# Patient Record
Sex: Male | Born: 1985 | Race: Black or African American | Hispanic: No | Marital: Married | Smoking: Former smoker
Health system: Southern US, Community
[De-identification: ages and names within clinical notes are randomized; demographics above are authoritative.]

## PROBLEM LIST (undated history)

## (undated) DIAGNOSIS — R945 Abnormal results of liver function studies: Secondary | ICD-10-CM

## (undated) DIAGNOSIS — I2510 Atherosclerotic heart disease of native coronary artery without angina pectoris: Secondary | ICD-10-CM

## (undated) DIAGNOSIS — I10 Essential (primary) hypertension: Secondary | ICD-10-CM

## (undated) DIAGNOSIS — M6282 Rhabdomyolysis: Secondary | ICD-10-CM

## (undated) DIAGNOSIS — R7989 Other specified abnormal findings of blood chemistry: Secondary | ICD-10-CM

## (undated) DIAGNOSIS — R030 Elevated blood-pressure reading, without diagnosis of hypertension: Secondary | ICD-10-CM

## (undated) DIAGNOSIS — Z9289 Personal history of other medical treatment: Secondary | ICD-10-CM

## (undated) DIAGNOSIS — R079 Chest pain, unspecified: Secondary | ICD-10-CM

## (undated) DIAGNOSIS — Z720 Tobacco use: Secondary | ICD-10-CM

## (undated) HISTORY — PX: OTHER SURGICAL HISTORY: SHX169

## (undated) HISTORY — PX: NECK SURGERY: SHX720

## (undated) HISTORY — DX: Atherosclerotic heart disease of native coronary artery without angina pectoris: I25.10

## (undated) HISTORY — DX: Elevated blood-pressure reading, without diagnosis of hypertension: R03.0

## (undated) HISTORY — DX: Personal history of other medical treatment: Z92.89

---

## 2004-04-24 ENCOUNTER — Emergency Department (HOSPITAL_COMMUNITY): Admission: EM | Admit: 2004-04-24 | Discharge: 2004-04-24 | Payer: Self-pay | Admitting: Emergency Medicine

## 2014-06-30 ENCOUNTER — Emergency Department
Admission: EM | Admit: 2014-06-30 | Discharge: 2014-06-30 | Disposition: A | Discharge status: Home / Self Care | Payer: PRIVATE HEALTH INSURANCE | Attending: Emergency Medicine | Admitting: Emergency Medicine

## 2014-06-30 ENCOUNTER — Encounter: Payer: Self-pay | Admitting: Emergency Medicine

## 2014-06-30 ENCOUNTER — Emergency Department: Payer: PRIVATE HEALTH INSURANCE

## 2014-06-30 DIAGNOSIS — Z720 Tobacco use: Secondary | ICD-10-CM | POA: Insufficient documentation

## 2014-06-30 DIAGNOSIS — M79672 Pain in left foot: Secondary | ICD-10-CM | POA: Insufficient documentation

## 2014-06-30 HISTORY — DX: Other specified abnormal findings of blood chemistry: R79.89

## 2014-06-30 HISTORY — DX: Abnormal results of liver function studies: R94.5

## 2014-06-30 LAB — COMPREHENSIVE METABOLIC PANEL
ALBUMIN: 4.1 g/dL (ref 3.5–5.0)
ALK PHOS: 53 U/L (ref 38–126)
ALT: 36 U/L (ref 17–63)
ANION GAP: 7 (ref 5–15)
AST: 43 U/L — AB (ref 15–41)
BUN: 15 mg/dL (ref 6–20)
CHLORIDE: 104 mmol/L (ref 101–111)
CO2: 28 mmol/L (ref 22–32)
CREATININE: 1.16 mg/dL (ref 0.61–1.24)
Calcium: 9 mg/dL (ref 8.9–10.3)
GFR calc Af Amer: >60 mL/min (ref >60)
GFR calc non Af Amer: >60 mL/min (ref >60)
Glucose, Bld: 104 mg/dL — ABNORMAL HIGH (ref 65–99)
POTASSIUM: 3.8 mmol/L (ref 3.5–5.1)
SODIUM: 139 mmol/L (ref 135–145)
TOTAL PROTEIN: 7.3 g/dL (ref 6.5–8.1)
Total Bilirubin: 0.5 mg/dL (ref 0.3–1.2)

## 2014-06-30 MED ORDER — IBUPROFEN 800 MG PO TABS: 800.0000 mg | ORAL_TABLET | Freq: Three times a day (TID) | ORAL | Status: DC | PRN

## 2014-06-30 NOTE — ED Notes (Signed)
Pt reports that he injured his right foot a few years back, and it is starting to act up. He is able to ambulate and bear weight without difficulty. He reports that while he is here he wants his liver enzymes checked.
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
## 2015-02-28 ENCOUNTER — Encounter: Payer: Self-pay | Admitting: Emergency Medicine

## 2015-02-28 ENCOUNTER — Emergency Department
Admission: EM | Admit: 2015-02-28 | Discharge: 2015-03-01 | Disposition: A | Discharge status: Home / Self Care | Payer: Self-pay | Attending: Emergency Medicine | Admitting: Emergency Medicine

## 2015-02-28 DIAGNOSIS — K920 Hematemesis: Secondary | ICD-10-CM

## 2015-02-28 DIAGNOSIS — R1032 Left lower quadrant pain: Secondary | ICD-10-CM

## 2015-02-28 DIAGNOSIS — F17200 Nicotine dependence, unspecified, uncomplicated: Secondary | ICD-10-CM | POA: Insufficient documentation

## 2015-02-28 DIAGNOSIS — K2970 Gastritis, unspecified, without bleeding: Secondary | ICD-10-CM | POA: Insufficient documentation

## 2015-02-28 DIAGNOSIS — N50819 Testicular pain, unspecified: Secondary | ICD-10-CM | POA: Insufficient documentation

## 2015-02-28 LAB — CBC
HCT: 44.2 % (ref 40.0–52.0)
Hemoglobin: 14.5 g/dL (ref 13.0–18.0)
MCH: 28.4 pg (ref 26.0–34.0)
MCHC: 32.9 g/dL (ref 32.0–36.0)
MCV: 86.2 fL (ref 80.0–100.0)
Platelets: 180 K/uL (ref 150–440)
RBC: 5.13 MIL/uL (ref 4.40–5.90)
RDW: 13.4 % (ref 11.5–14.5)
WBC: 6.3 K/uL (ref 3.8–10.6)

## 2015-02-28 LAB — COMPREHENSIVE METABOLIC PANEL
ALBUMIN: 4.7 g/dL (ref 3.5–5.0)
ALT: 31 U/L (ref 17–63)
ANION GAP: 6 (ref 5–15)
AST: 52 U/L — AB (ref 15–41)
Alkaline Phosphatase: 67 U/L (ref 38–126)
BUN: 11 mg/dL (ref 6–20)
CHLORIDE: 107 mmol/L (ref 101–111)
CO2: 30 mmol/L (ref 22–32)
Calcium: 9.1 mg/dL (ref 8.9–10.3)
Creatinine, Ser: 0.95 mg/dL (ref 0.61–1.24)
GFR calc Af Amer: >60 mL/min (ref >60)
GFR calc non Af Amer: >60 mL/min (ref >60)
GLUCOSE: 95 mg/dL (ref 65–99)
POTASSIUM: 3.9 mmol/L (ref 3.5–5.1)
SODIUM: 143 mmol/L (ref 135–145)
Total Bilirubin: 0.6 mg/dL (ref 0.3–1.2)
Total Protein: 7.8 g/dL (ref 6.5–8.1)

## 2015-02-28 LAB — LIPASE, BLOOD: Lipase: 21 U/L (ref 11–51)

## 2015-02-28 MED ORDER — FAMOTIDINE 20 MG PO TABS
40.0000 mg | ORAL_TABLET | Freq: Once | ORAL | Status: AC
  Administered 2015-03-01: 40 mg via ORAL
  Filled 2015-02-28: qty 2

## 2015-02-28 MED ORDER — ONDANSETRON 4 MG PO TBDP
4.0000 mg | ORAL_TABLET | Freq: Once | ORAL | Status: AC
  Administered 2015-03-01: 4 mg via ORAL
  Filled 2015-02-28: qty 1

## 2015-02-28 MED ORDER — GI COCKTAIL ~~LOC~~
30.0000 mL | Freq: Once | ORAL | Status: AC
  Administered 2015-03-01: 30 mL via ORAL
  Filled 2015-02-28: qty 30

## 2015-02-28 NOTE — ED Notes (Addendum)
Pt says he used to be a heavy drinker but quit about 6 months ago;  today he drank 9 beers; began feeling bad from the alcohol so he made himself vomit; after vomiting several times he noticed bright red blood; c/o abd pain; no history of similar symptoms

## 2015-02-28 NOTE — ED Notes (Signed)
Pt also complains of left sided groin pain. Pt states pain increases "when i work out." pt denies known hematuria, denies fever, difficulty urinating, foul odor noted to urine. Pt denies back pain. Pt moving all extremities without difficulty.

## 2015-02-28 NOTE — ED Provider Notes (Signed)
[facility] Emergency Department Provider Note  ____________________________________________  Time seen: Approximately 11:43 PM  I have reviewed the triage vital signs and the nursing notes.   HISTORY  Chief Complaint Hematemesis and Abdominal Pain    HPI Benjamin Beltran is a 30 y.o. male who comes into the hospital with upper abdominal pain and hematemesis. The patient reports that he vomited blood after drinking a little. He reports he is also had groin pain for 2 months and he is unsure if that has anything to do with the blood in his vomit. The patient reports that he drank a 12 pack of beer this weekend. He drinks 3 beers yesterday and 9 beers today. He reports that he hasn't drank in a few months so when he felt sick he decided to induce vomiting. He reports that when he first started vomiting he didn't see what look like as he was outside but then when he was finished and turned the light on he saw some blood in the snow. He reports it was a lot of blood and look like it was purely blood. The patient's also having some epigastric and left upper quadrant abdominal pain. He rates the pain a 5 out of 10 in intensity. He reports that along with his groin pain and makes it feel as if his whole left side hurts. The patient still feels nauseous but last vomited 1 hour ago. The patient reports that with the groin pain and seems to be worse after exercise or running. He reports that he hasn't ran in the last 2 days and it did seem a little bit better but it seems to come and go as well. The patient is unsure what going on so he decided to come in and get checked out.   Past Medical History  Diagnosis Date  . Elevated liver function tests unk    There are no active problems to display for this patient.   Past Surgical History  Procedure Laterality Date  . Tendon surgery to left hand  unk    Current Outpatient Rx  Name  Route  Sig  Dispense  Refill  . famotidine  (PEPCID) 40 MG tablet   Oral   Take 1 tablet (40 mg total) by mouth every evening.   20 tablet   0   . ibuprofen (ADVIL,MOTRIN) 800 MG tablet   Oral   Take 1 tablet (800 mg total) by mouth every 8 (eight) hours as needed.   30 tablet   0     Allergies Review of patient's allergies indicates no known allergies.  History reviewed. No pertinent family history.  Social History Social History  Substance Use Topics  . Smoking status: Current Every Day Smoker  . Smokeless tobacco: None  . Alcohol Use: Yes    Review of Systems Constitutional: No fever/chills Eyes: No visual changes. ENT: No sore throat. Cardiovascular: Denies chest pain. Respiratory: Denies shortness of breath. Gastrointestinal:  abdominal pain.  nausea,  Vomiting with hematemesis.  No diarrhea.  No constipation. Genitourinary: Groin and testicle pain Musculoskeletal: Negative for back pain. Skin: Negative for rash. Neurological: Negative for headaches, focal weakness or numbness.  10-point ROS otherwise negative.  ____________________________________________   PHYSICAL EXAM:  VITAL SIGNS: ED Triage Vitals  Enc Vitals Group     BP 02/28/15 2240 119/67 mmHg     Pulse Rate 02/28/15 2240 66     Resp 02/28/15 2240 18     Temp 02/28/15 2240 97.9 F (  36.6 C)     Temp Source 02/28/15 2240 Oral     SpO2 02/28/15 2240 100 %     Weight 02/28/15 2240 220 lb (99.791 kg)     Height 02/28/15 2240 5\' 11"  (1.803 m)     Head Cir --      Peak Flow --      Pain Score 02/28/15 2241 3     Pain Loc --      Pain Edu? --      Excl. in GC? --     Constitutional: Alert and oriented. Well appearing and in mild distress. Eyes: Conjunctivae are normal. PERRL. EOMI. Head: Atraumatic. Nose: No congestion/rhinnorhea. Mouth/Throat: Mucous membranes are moist.  Oropharynx non-erythematous. Cardiovascular: Normal rate, regular rhythm. Grossly normal heart sounds.  Good peripheral circulation. Respiratory: Normal  respiratory effort.  No retractions. Lungs CTAB. Gastrointestinal: Soft with epigastric and left upper quadrant tenderness to palpation. No distention. Positive bowel sounds Genitourinary: Mild testicular tenderness to palpation with no bulging at the inguinal canal or into scrotum Musculoskeletal: No lower extremity tenderness nor edema.   Neurologic:  Normal speech and language. No gross focal neurologic deficits are appreciated. No gait instability. Skin:  Skin is warm, dry and intact.  Psychiatric: Mood and affect are normal.   ____________________________________________   LABS (all labs ordered are listed, but only abnormal results are displayed)  Labs Reviewed  COMPREHENSIVE METABOLIC PANEL - Abnormal; Notable for the following:    AST 52 (*)    All other components within normal limits  URINALYSIS COMPLETEWITH MICROSCOPIC ([facility] ONLY) - Abnormal; Notable for the following:    Color, Urine YELLOW (*)    APPearance CLEAR (*)    Hgb urine dipstick 1+ (*)    Bacteria, UA RARE (*)    Squamous Epithelial / LPF 0-5 (*)    All other components within normal limits  LIPASE, BLOOD  CBC   ____________________________________________  EKG  none ____________________________________________  RADIOLOGY  Testicular ultrasound: No testicular torsion, right epididymal head cyst, right sided varicocele. ____________________________________________   PROCEDURES  Procedure(s) performed: None  Critical Care performed: No  ____________________________________________   INITIAL IMPRESSION / ASSESSMENT AND PLAN / ED COURSE  Pertinent labs & imaging results that were available during my care of the patient were reviewed by me and considered in my medical decision making (see chart for details).  This is a 30 year old male who comes into the hospital today with some left upper quadrant pain after vomiting copiously as well as some hematemesis. Feel that the patient may have some  gastritis which may have caused the vomiting and the blood. I will give the patient a dose of Zofran as well as a GI cocktail and Pepcid. I will also send the patient for an ultrasound given his testicle pain that worsens with activity. He may have some epididymitis causing his symptoms. I will reassess the patient once he received his medication and his ultrasound.  After the GI cocktail the patient's symptoms felt better. I went to the patient that he had gastritis likely from the a lot of alcohol which she drank. At this time the patient will be discharged home. I splinted him that he needs some support to his testicles to help with some of that discomfort he [facility]. The patient will be discharged home to follow-up. ____________________________________________   FINAL CLINICAL IMPRESSION(S) / ED DIAGNOSES  Final diagnoses:  Left groin pain  Gastritis  Hematemesis with nausea  Rebecka Apley, MD 03/01/15 [phone]

## 2015-03-01 ENCOUNTER — Emergency Department: Payer: PRIVATE HEALTH INSURANCE

## 2015-03-01 LAB — URINALYSIS COMPLETE WITH MICROSCOPIC (ARMC ONLY)
Bilirubin Urine: NEGATIVE
Glucose, UA: NEGATIVE mg/dL
KETONES UR: NEGATIVE mg/dL
Leukocytes, UA: NEGATIVE
NITRITE: NEGATIVE
PH: 5 (ref 5.0–8.0)
PROTEIN: NEGATIVE mg/dL
RBC / HPF: NONE SEEN RBC/hpf (ref 0–5)
SPECIFIC GRAVITY, URINE: 1.023 (ref 1.005–1.030)

## 2015-03-01 MED ORDER — FAMOTIDINE 40 MG PO TABS
40.0000 mg | ORAL_TABLET | Freq: Every evening | ORAL | Status: DC
Start: 2015-03-01 — End: 2015-12-27

## 2015-03-01 NOTE — Discharge Instructions (Signed)
Gastritis, Adult Gastritis is soreness and swelling (inflammation) of the lining of the stomach. Gastritis can develop as a sudden onset (acute) or long-term (chronic) condition. If gastritis is not treated, it can lead to stomach bleeding and ulcers. CAUSES  Gastritis occurs when the stomach lining is weak or damaged. Digestive juices from the stomach then inflame the weakened stomach lining. The stomach lining may be weak or damaged due to viral or bacterial infections. One common bacterial infection is the Helicobacter pylori infection. Gastritis can also result from excessive alcohol consumption, taking certain medicines, or having too much acid in the stomach.  SYMPTOMS  In some cases, there are no symptoms. When symptoms are present, they may include:  Pain or a burning sensation in the upper abdomen.  Nausea.  Vomiting.  An uncomfortable feeling of fullness after eating. DIAGNOSIS  Your caregiver may suspect you have gastritis based on your symptoms and a physical exam. To determine the cause of your gastritis, your caregiver may perform the following:  Blood or stool tests to check for the H pylori bacterium.  Gastroscopy. A thin, flexible tube (endoscope) is passed down the esophagus and into the stomach. The endoscope has a light and camera on the end. Your caregiver uses the endoscope to view the inside of the stomach.  Taking a tissue sample (biopsy) from the stomach to examine under a microscope. TREATMENT  Depending on the cause of your gastritis, medicines may be prescribed. If you have a bacterial infection, such as an H pylori infection, antibiotics may be given. If your gastritis is caused by too much acid in the stomach, H2 blockers or antacids may be given. Your caregiver may recommend that you stop taking aspirin, ibuprofen, or other nonsteroidal anti-inflammatory drugs (NSAIDs). HOME CARE INSTRUCTIONS  Only take over-the-counter or prescription medicines as directed by  your caregiver.  If you were given antibiotic medicines, take them as directed. Finish them even if you start to feel better.  Drink enough fluids to keep your urine clear or pale yellow.  Avoid foods and drinks that make your symptoms worse, such as:  Caffeine or alcoholic drinks.  Chocolate.  Peppermint or mint flavorings.  Garlic and onions.  Spicy foods.  Citrus fruits, such as oranges, lemons, or limes.  Tomato-based foods such as sauce, chili, salsa, and pizza.  Fried and fatty foods.  Eat small, frequent meals instead of large meals. SEEK IMMEDIATE MEDICAL CARE IF:   You have black or dark red stools.  You vomit blood or material that looks like coffee grounds.  You are unable to keep fluids down.  Your abdominal pain gets worse.  You have a fever.  You do not feel better after 1 week.  You have any other questions or concerns. MAKE SURE YOU:  Understand these instructions.  Will watch your condition.  Will get help right away if you are not doing well or get worse.   This information is not intended to replace advice given to you by your health care provider. Make sure you discuss any questions you have with your health care provider.   Document Released: 01/31/2001 Document Revised: 08/08/2011 Document Reviewed: 03/22/2011 Elsevier Interactive Patient Education 2016 Elsevier Inc.  Hematemesis Hematemesis is when you vomit blood. It is a sign of bleeding in the upper part of your digestive tract. This is also called your gastrointestinal (GI) tract. Your upper GI tract includes your mouth, throat, esophagus, stomach, and the first part of your small intestine (duodenum).  Hematemesis is usually caused by bleeding from your esophagus or stomach. You may suddenly vomit bright red blood. You might also vomit old blood. It may look like coffee grounds. You may also have other symptoms, such as:  Stomach pain.  Heartburn.  Black and tarry stool.   HOME CARE INSTRUCTIONS  Watch your hematemesis for any changes. The following actions may help to lessen any discomfort you are feeling:  Take medicines only as directed by your health care provider. Do not take aspirin, ibuprofen, or any other anti-inflammatory medicine without approval from your health care provider.  Rest as needed.  Drink small sips of clear liquids often, as long as you can keep them down. Try to drink enough fluids to keep your urine clear or pale yellow.  Do not drink alcohol.  Do not use any tobacco products, including cigarettes, chewing tobacco, or electronic cigarettes. If you need help quitting, ask your health care provider.  Keep all follow-up visits as directed by your health care provider. This is important. SEEK MEDICAL CARE IF:   The vomiting of blood worsens, or begins again after it has stopped.  You have persistent stomach pain.  You have nausea, indigestion, or heartburn.  You feel weak or dizzy. SEEK IMMEDIATE MEDICAL CARE IF:   You faint or feel extremely weak.  You have a rapid heartbeat.  You are urinating less than normal or not at all.  You have persistent vomiting.  You vomit large amounts of bloody or dark material.  You vomit bright red blood.  You pass large, dark, or bloody stools.  You have chest pain or trouble breathing.   This information is not intended to replace advice given to you by your health care provider. Make sure you discuss any questions you have with your health care provider.   Document Released: 03/16/2004 Document Revised: 02/27/2014 Document Reviewed: 10/01/2013 Elsevier Interactive Patient Education 2016 Elsevier Inc.  Nausea and Vomiting Nausea is a sick feeling that often comes before throwing up (vomiting). Vomiting is a reflex where stomach contents come out of your mouth. Vomiting can cause severe loss of body fluids (dehydration). Children and elderly adults can become dehydrated  quickly, especially if they also have diarrhea. Nausea and vomiting are symptoms of a condition or disease. It is important to find the cause of your symptoms. CAUSES   Direct irritation of the stomach lining. This irritation can result from increased acid production (gastroesophageal reflux disease), infection, food poisoning, taking certain medicines (such as nonsteroidal anti-inflammatory drugs), alcohol use, or tobacco use.  Signals from the brain.These signals could be caused by a headache, heat exposure, an inner ear disturbance, increased pressure in the brain from injury, infection, a tumor, or a concussion, pain, emotional stimulus, or metabolic problems.  An obstruction in the gastrointestinal tract (bowel obstruction).  Illnesses such as diabetes, hepatitis, gallbladder problems, appendicitis, kidney problems, cancer, sepsis, atypical symptoms of a heart attack, or eating disorders.  Medical treatments such as chemotherapy and radiation.  Receiving medicine that makes you sleep (general anesthetic) during surgery. DIAGNOSIS Your caregiver may ask for tests to be done if the problems do not improve after a few days. Tests may also be done if symptoms are severe or if the reason for the nausea and vomiting is not clear. Tests may include:  Urine tests.  Blood tests.  Stool tests.  Cultures (to look for evidence of infection).  X-rays or other imaging studies. Test results can help your caregiver make decisions  about treatment or the need for additional tests. TREATMENT You need to stay well hydrated. Drink frequently but in small amounts.You may wish to drink water, sports drinks, clear broth, or eat frozen ice pops or gelatin dessert to help stay hydrated.When you eat, eating slowly may help prevent nausea.There are also some antinausea medicines that may help prevent nausea. HOME CARE INSTRUCTIONS   Take all medicine as directed by your caregiver.  If you do not have  an appetite, do not force yourself to eat. However, you must continue to drink fluids.  If you have an appetite, eat a normal diet unless your caregiver tells you differently.  Eat a variety of complex carbohydrates (rice, wheat, potatoes, bread), lean meats, yogurt, fruits, and vegetables.  Avoid high-fat foods because they are more difficult to digest.  Drink enough water and fluids to keep your urine clear or pale yellow.  If you are dehydrated, ask your caregiver for specific rehydration instructions. Signs of dehydration may include:  Severe thirst.  Dry lips and mouth.  Dizziness.  Dark urine.  Decreasing urine frequency and amount.  Confusion.  Rapid breathing or pulse. SEEK IMMEDIATE MEDICAL CARE IF:   You have blood or brown flecks (like coffee grounds) in your vomit.  You have black or bloody stools.  You have a severe headache or stiff neck.  You are confused.  You have severe abdominal pain.  You have chest pain or trouble breathing.  You do not urinate at least once every 8 hours.  You develop cold or clammy skin.  You continue to vomit for longer than 24 to 48 hours.  You have a fever. MAKE SURE YOU:   Understand these instructions.  Will watch your condition.  Will get help right away if you are not doing well or get worse.   This information is not intended to replace advice given to you by your health care provider. Make sure you discuss any questions you have with your health care provider.   Document Released: 02/06/2005 Document Revised: 05/01/2011 Document Reviewed: 07/06/2010 Elsevier Interactive Patient Education [employer] Inc[employer]

## 2015-03-01 NOTE — ED Notes (Signed)
MD at bedside. 

## 2015-03-01 NOTE — ED Notes (Signed)
Pt states feels improved after administration of medication. Pt watching tv and texting on phone in no acute distress.

## 2015-03-01 NOTE — ED Notes (Signed)
Pt updated on ultrasound process. Pt verbalizes understanding.  

## 2015-03-01 NOTE — ED Notes (Signed)
Pt returned from ultrasound

## 2015-03-01 NOTE — ED Notes (Signed)
Patient transported to Ultrasound 

## 2015-03-01 NOTE — ED Notes (Signed)
Pt discharged by laurie allen, rn.  

## 2015-04-19 ENCOUNTER — Emergency Department
Admission: EM | Admit: 2015-04-19 | Discharge: 2015-04-20 | Disposition: A | Discharge status: Home / Self Care | Payer: PRIVATE HEALTH INSURANCE | Attending: Emergency Medicine | Admitting: Emergency Medicine

## 2015-04-19 DIAGNOSIS — F17200 Nicotine dependence, unspecified, uncomplicated: Secondary | ICD-10-CM | POA: Insufficient documentation

## 2015-04-19 DIAGNOSIS — K047 Periapical abscess without sinus: Secondary | ICD-10-CM

## 2015-04-19 DIAGNOSIS — Z79899 Other long term (current) drug therapy: Secondary | ICD-10-CM | POA: Insufficient documentation

## 2015-04-19 DIAGNOSIS — K029 Dental caries, unspecified: Secondary | ICD-10-CM

## 2015-04-19 MED ORDER — PENICILLIN V POTASSIUM 500 MG PO TABS
500.0000 mg | ORAL_TABLET | Freq: Once | ORAL | Status: AC
  Administered 2015-04-19: 500 mg via ORAL
  Filled 2015-04-19: qty 1

## 2015-04-19 MED ORDER — PENICILLIN V POTASSIUM 500 MG PO TABS: 500.0000 mg | ORAL_TABLET | Freq: Four times a day (QID) | ORAL | Status: DC

## 2015-04-19 MED ORDER — LIDOCAINE-EPINEPHRINE 2 %-1:100000 IJ SOLN
1.7000 mL | Freq: Once | INTRAMUSCULAR | Status: DC
  Filled 2015-04-19: qty 1.7

## 2015-04-19 NOTE — ED Notes (Signed)
Pt presents to ED with c/o LEFT upper and lower jaw pain x1 month. Obvious swelling noted, pt reports 8/10 pain. Pt denies difficulty swallowing or breathing; reports painful to chew.

## 2015-04-19 NOTE — ED Notes (Signed)
Pt arrived to ED with c/o tooth pain. Pt c/o pain in left upper and lower jaw. + facial swelling noted.

## 2015-04-19 NOTE — Discharge Instructions (Signed)
Dental Abscess A dental abscess is pus in or around a tooth. HOME CARE  Take medicines only as told by your dentist.  If you were prescribed antibiotic medicine, finish all of it even if you start to feel better.  Rinse your mouth (gargle) often with salt water.  Do not drive or use heavy machinery, like a lawn mower, while taking pain medicine.  Do not apply heat to the outside of your mouth.  Keep all follow-up visits as told by your dentist. This is important. GET HELP IF:  Your pain is worse, and medicine does not help. GET HELP RIGHT AWAY IF:  You have a fever or chills.  Your symptoms suddenly get worse.  You have a very bad headache.  You have problems breathing or swallowing.  You have trouble opening your mouth.  You have puffiness (swelling) in your neck or around your eye.   This information is not intended to replace advice given to you by your health care provider. Make sure you discuss any questions you have with your health care provider.   Document Released: 06/23/2014 Document Reviewed: 06/23/2014 Elsevier Interactive Patient Education 2016 Elsevier Inc.  Dental Caries Dental caries (also called tooth decay) is the most common oral disease. It can occur at any age but is more common in children and young adults.  HOW DENTAL CARIES DEVELOPS  The process of decay begins when bacteria and foods (particularly sugars and starches) combine in your mouth to produce plaque. Plaque is a substance that sticks to the hard, outer surface of a tooth (enamel). The bacteria in plaque produce acids that attack enamel. These acids may also attack the root surface of a tooth (cementum) if it is exposed. Repeated attacks dissolve these surfaces and create holes in the tooth (cavities). If left untreated, the acids destroy the other layers of the tooth.  RISK FACTORS  Frequent sipping of sugary beverages.   Frequent snacking on sugary and starchy foods, especially those  that easily get stuck in the teeth.   Poor oral hygiene.   Dry mouth.   Substance abuse such as methamphetamine abuse.   Broken or poor-fitting dental restorations.   Eating disorders.   Gastroesophageal reflux disease (GERD).   Certain radiation treatments to the head and neck. SYMPTOMS In the early stages of dental caries, symptoms are seldom present. Sometimes white, chalky areas may be seen on the enamel or other tooth layers. In later stages, symptoms may include:  Pits and holes on the enamel.  Toothache after sweet, hot, or cold foods or drinks are consumed.  Pain around the tooth.  Swelling around the tooth. DIAGNOSIS  Most of the time, dental caries is detected during a regular dental checkup. A diagnosis is made after a thorough medical and dental history is taken and the surfaces of your teeth are checked for signs of dental caries. Sometimes special instruments, such as lasers, are used to check for dental caries. Dental X-ray exams may be taken so that areas not visible to the eye (such as between the contact areas of the teeth) can be checked for cavities.  TREATMENT  If dental caries is in its early stages, it may be reversed with a fluoride treatment or an application of a remineralizing agent at the dental office. Thorough brushing and flossing at home is needed to aid these treatments. If it is in its later stages, treatment depends on the location and extent of tooth destruction:   If a small area  of the tooth has been destroyed, the destroyed area will be removed and cavities will be filled with a material such as gold, silver amalgam, or composite resin.   If a large area of the tooth has been destroyed, the destroyed area will be removed and a cap (crown) will be fitted over the remaining tooth structure.   If the center part of the tooth (pulp) is affected, a procedure called a root canal will be needed before a filling or crown can be placed.   If  most of the tooth has been destroyed, the tooth may need to be pulled (extracted). HOME CARE INSTRUCTIONS You can prevent, stop, or reverse dental caries at home by practicing good oral hygiene. Good oral hygiene includes:  Thoroughly cleaning your teeth at least twice a day with a toothbrush and dental floss.   Using a fluoride toothpaste. A fluoride mouth rinse may also be used if recommended by your dentist or health care provider.   Restricting the amount of sugary and starchy foods and sugary liquids you consume.   Avoiding frequent snacking on these foods and sipping of these liquids.   Keeping regular visits with a dentist for checkups and cleanings. PREVENTION   Practice good oral hygiene.  Consider a dental sealant. A dental sealant is a coating material that is applied by your dentist to the pits and grooves of teeth. The sealant prevents food from being trapped in them. It may protect the teeth for several years.  Ask about fluoride supplements if you live in a community without fluorinated water or with water that has a low fluoride content. Use fluoride supplements as directed by your dentist or health care provider.  Allow fluoride varnish applications to teeth if directed by your dentist or health care provider.   This information is not intended to replace advice given to you by your health care provider. Make sure you discuss any questions you have with your health care provider.   Document Released: 10/29/2001 Document Revised: 02/27/2014 Document Reviewed: 02/09/2012 Elsevier Interactive Patient Education 2016 Elsevier Inc.  Dental Care and Dentist Visits Dental care supports good overall health. Regular dental visits can also help you avoid dental pain, bleeding, infection, and other more serious health problems in the future. It is important to keep the mouth healthy because diseases in the teeth, gums, and other oral tissues can spread to other areas of the  body. Some problems, such as diabetes, heart disease, and pre-term labor have been associated with poor oral health.  See your dentist every 6 months. If you experience emergency problems such as a toothache or broken tooth, go to the dentist right away. If you see your dentist regularly, you may catch problems early. It is easier to be treated for problems in the early stages.  WHAT TO EXPECT AT A DENTIST VISIT  Your dentist will look for many common oral health problems and recommend proper treatment. At your regular dental visit, you can expect:  Gentle cleaning of the teeth and gums. This includes scraping and polishing. This helps to remove the sticky substance around the teeth and gums (plaque). Plaque forms in the mouth shortly after eating. Over time, plaque hardens on the teeth as tartar. If tartar is not removed regularly, it can cause problems. Cleaning also helps remove stains.  Periodic X-rays. These pictures of the teeth and supporting bone will help your dentist assess the health of your teeth.  Periodic fluoride treatments. Fluoride is a natural mineral  shown to help strengthen teeth. Fluoride treatmentinvolves applying a fluoride gel or varnish to the teeth. It is most commonly done in children.  Examination of the mouth, tongue, jaws, teeth, and gums to look for any oral health problems, such as:  Cavities (dental caries). This is decay on the tooth caused by plaque, sugar, and acid in the mouth. It is best to catch a cavity when it is small.  Inflammation of the gums caused by plaque buildup (gingivitis).  Problems with the mouth or malformed or misaligned teeth.  Oral cancer or other diseases of the soft tissues or jaws. KEEP YOUR TEETH AND GUMS HEALTHY For healthy teeth and gums, follow these general guidelines as well as your dentist's specific advice:  Have your teeth professionally cleaned at the dentist every 6 months.  Brush twice daily with a fluoride  toothpaste.  Floss your teeth daily.  Ask your dentist if you need fluoride supplements, treatments, or fluoride toothpaste.  Eat a healthy diet. Reduce foods and drinks with added sugar.  Avoid smoking. TREATMENT FOR ORAL HEALTH PROBLEMS If you have oral health problems, treatment varies depending on the conditions present in your teeth and gums.  Your caregiver will most likely recommend good oral hygiene at each visit.  For cavities, gingivitis, or other oral health disease, your caregiver will perform a procedure to treat the problem. This is typically done at a separate appointment. Sometimes your caregiver will refer you to another dental specialist for specific tooth problems or for surgery. SEEK IMMEDIATE DENTAL CARE IF:  You have pain, bleeding, or soreness in the gum, tooth, jaw, or mouth area.  A permanent tooth becomes loose or separated from the gum socket.  You experience a blow or injury to the mouth or jaw area.   This information is not intended to replace advice given to you by your health care provider. Make sure you discuss any questions you have with your health care provider.   Document Released: 10/19/2010 Document Revised: 05/01/2011 Document Reviewed: 10/19/2010 Elsevier Interactive Patient Education [employer]  Take the antibiotic as directed. Follow-up with one of the dental clinics listed below.   OPTIONS FOR DENTAL FOLLOW UP CARE  [place] [facility] - Local Safety Net Dental Clinics [email or web address]   [facility] [phone])  [facility] [phone])  [place] [phone])  [facility] [phone])  [facility] [phone]) This clinic caters to the indigent population and is on a lottery system. Location: [employer] [facility], [employer], [address] [place] Clinic Hours: Wednesdays from 6pm - 9pm, patients seen by a lottery system. For dates, call or go to [email or web address] Services: Cleanings, fillings and simple extractions. Payment Options: DENTAL WORK IS FREE OF CHARGE. Bring proof of income or support. Best way to get seen: Arrive at 5:15 pm - this is a lottery, NOT first come/first serve, so arriving earlier will not increase your chances of being seen.     [facility] [phone] Select option 1 for emergencies   Location: [facility], [place], [address], [place] Clinic Hours: No walk-ins accepted - call the day before to schedule an appointment. Check in times are 9:30 am and 1:30 pm. Services: Simple extractions, temporary fillings, pulpectomy/pulp debridement, uncomplicated abscess drainage. Payment Options: PAYMENT IS DUE AT THE TIME OF SERVICE.  Fee is usually $100-200, additional surgical procedures (e.g. abscess drainage)  may be extra. Cash, checks, Visa/MasterCard accepted.  Can file Medicaid if patient is covered for dental - patient should call case worker to check. No discount for [facility] patients. Best way to get seen: MUST call the day before and get onto the schedule. Can usually be seen the next 1-2 days. No walk-ins accepted.     [facility] [phone]   Location: [facility], [address] [place] Clinic Hours: M, W, Th, F 8am or 1:30pm, Tues 9a or 1:30 - first come/first served. Services: Simple extractions, temporary fillings, uncomplicated abscess drainage.  You do not need to be an [facility] resident. Payment Options: PAYMENT IS DUE AT THE TIME OF SERVICE. Dental insurance, otherwise sliding scale - bring proof of income or support. Depending on income and treatment needed, cost is usually $50-200. Best way to get seen: Arrive early as it is first come/first  served.     [facility] [phone]   Location: [address] Clinic Hours: Mon-Thu 8a-5p Services: Most basic dental services including extractions and fillings. Payment Options: PAYMENT IS DUE AT THE TIME OF SERVICE. Sliding scale, up to 50% off - bring proof if income or support. Medicaid with dental option accepted. Best way to get seen: Call to schedule an appointment, can usually be seen within 2 weeks OR they will try to see walk-ins - show up at 8a or 2p (you may have to wait).     [facility] [phone] [place] RESIDENTS ONLY   Location: [facility], [address] [place], [place] [address] Clinic Hours: By appointment only. Monday - Thursday 8am-5pm, Friday 8am-12pm Services: Cleanings, fillings, extractions. Payment Options: PAYMENT IS DUE AT THE TIME OF SERVICE. Cash, Visa or MasterCard. Sliding scale - $30 minimum per service. Best way to get seen: Come in to office, complete packet and make an appointment - need proof of income or support monies for each household member and proof of [place] residence. Usually takes about a month to get in.     [facility] [phone]   Location: [address] [facility] Clinic Hours: Walk-in Urgent Care Dental Services are offered Monday-Friday mornings only. The numbers of emergencies accepted daily is limited to the number of providers available. Maximum 15 - Mondays, Wednesdays & Thursdays Maximum 10 - Tuesdays & Fridays Services: You do not need to be a [facility] resident to be seen for a dental emergency. Emergencies are defined as pain, swelling, abnormal bleeding, or dental trauma. Walkins will receive x-rays if needed. NOTE: Dental cleaning is not an emergency. Payment Options: PAYMENT IS DUE AT THE TIME OF SERVICE. Minimum co-pay is $40.00 for uninsured patients. Minimum  co-pay is $3.00 for Medicaid with dental coverage. Dental Insurance is accepted and must be presented at time of visit. Medicare does not cover dental. Forms of payment: Cash, credit card, checks. Best way to get seen: If not previously registered with the clinic, walk-in dental registration begins at 7:15 am and is on a first come/first serve basis. If previously registered with the clinic, call to make an appointment.     [facility] [phone] [place] RESIDENTS ONLY   Location: [address], [place], [place] Clinic Hours: Mon-Thu 10a-2p Services: Extractions only! Payment Options: FREE (donations accepted) - bring proof of income or support Best way to get seen: Call and schedule an appointment OR come at 8am on the  1st Monday of every month (except for holidays) when it is first come/first served.     [facility] [phone]   Location: [address] [place], [place] Clinic Hours: Friday mornings Services, Payment Options, Best way to get seen: Call for info

## 2015-04-20 NOTE — ED Provider Notes (Signed)
[facility] Emergency Department Provider Note ____________________________________________  Time seen: 2340  I have reviewed the triage vital signs and the nursing notes.  HISTORY  Chief Complaint  Dental Pain  HPI Benjamin Beltran is a 30 y.o. male presents to the ED for evaluation of left-sided dental pain to the upper and lower jaw for the last month. He denies any fevers, chills, sweats. He also denies any significant purulent discharge, gum swelling, or fluctuance. He has been dosing ibuprofen intermittently for pain relief. He denies extremity but does admit to a localized to where he's had 2 previous cavities filled.He reports his pain at an 8/10 in triage.  Past Medical History  Diagnosis Date  . Elevated liver function tests unk    There are no active problems to display for this patient.   Past Surgical History  Procedure Laterality Date  . Tendon surgery to left hand  unk    Current Outpatient Rx  Name  Route  Sig  Dispense  Refill  . famotidine (PEPCID) 40 MG tablet   Oral   Take 1 tablet (40 mg total) by mouth every evening.   20 tablet   0   . ibuprofen (ADVIL,MOTRIN) 800 MG tablet   Oral   Take 1 tablet (800 mg total) by mouth every 8 (eight) hours as needed.   30 tablet   0   . penicillin v potassium (VEETID) 500 MG tablet   Oral   Take 1 tablet (500 mg total) by mouth 4 (four) times daily.   40 tablet   0    Allergies Review of patient's allergies indicates no known allergies.  History reviewed. No pertinent family history.  Social History Social History  Substance Use Topics  . Smoking status: Current Every Day Smoker  . Smokeless tobacco: None  . Alcohol Use: Yes   Review of Systems  Constitutional: Negative for fever. Eyes: Negative for visual changes. ENT: Negative for sore throat. Dental pain as above. Cardiovascular: Negative for chest pain. Respiratory: Negative for shortness of  breath. Gastrointestinal: Negative for abdominal pain, vomiting and diarrhea. Genitourinary: Negative for dysuria. Musculoskeletal: Negative for back pain. Skin: Negative for rash. Neurological: Negative for headaches, focal weakness or numbness. ____________________________________________  PHYSICAL EXAM:  VITAL SIGNS: ED Triage Vitals  Enc Vitals Group     BP 04/19/15 2209 120/67 mmHg     Pulse Rate 04/19/15 2209 68     Resp 04/19/15 2209 18     Temp 04/19/15 2209 98 F (36.7 C)     Temp Source 04/19/15 2209 Oral     SpO2 04/19/15 2209 97 %     Weight 04/19/15 2209 225 lb (102.059 kg)     Height 04/19/15 2209  (1.803 m)     Head Cir --      Peak Flow --      Pain Score 04/19/15 2212 7     Pain Loc --      Pain Edu? --      Excl. in GC? --    Constitutional: Alert and oriented. Well appearing and in no distress. Head: Normocephalic and atraumatic.      Eyes: Conjunctivae are normal. PERRL. Normal extraocular movements      Ears: Canals clear. TMs intact bilaterally.   Nose: No congestion/rhinorrhea.   Mouth/Throat: Mucous membranes are moist. Patient without any significant gum tenderness or abscess. He is noted to have 2 cavities one to the upper jaw on the left at  the first molar which has been previously filled, and a second cavity to the lower left jaw at the premolar, also with a previous filling.   Neck: Supple. No thyromegaly. Hematological/Lymphatic/Immunological: No cervical lymphadenopathy. Cardiovascular: Normal rate, regular rhythm.  Respiratory: Normal respiratory effort.  Musculoskeletal: Nontender with normal range of motion in all extremities.  Neurologic:  Normal gait without ataxia. Normal speech and language. No gross focal neurologic deficits are appreciated. Skin:  Skin is warm, dry and intact. No rash noted. ____________________________________________  PROCEDURES  Pen VK 500 mg  PO ____________________________________________  INITIAL IMPRESSION / ASSESSMENT AND PLAN / ED COURSE  Patient with dental pain for the last month with empiric Treatment for dental abscess. Patient is discharged with a prescription for Pen-Vee K to dose as directed. He is also advised to dose ibuprofen or Motrin for intermittent to the primary care provider for further management dental provider list is given. ____________________________________________  FINAL CLINICAL IMPRESSION(S) / ED DIAGNOSES  Final diagnoses:  Pain due to dental caries  Dental infection      Lissa Hoard, PA-C 04/20/15 0034  Lissa Hoard, PA-C 04/27/15 0014  Rockne Menghini, MD 04/28/15 2203

## 2015-07-08 ENCOUNTER — Emergency Department
Admission: EM | Admit: 2015-07-08 | Discharge: 2015-07-08 | Disposition: A | Discharge status: Home / Self Care | Payer: PRIVATE HEALTH INSURANCE | Attending: Student | Admitting: Student

## 2015-07-08 DIAGNOSIS — Z791 Long term (current) use of non-steroidal anti-inflammatories (NSAID): Secondary | ICD-10-CM | POA: Insufficient documentation

## 2015-07-08 DIAGNOSIS — F10920 Alcohol use, unspecified with intoxication, uncomplicated: Secondary | ICD-10-CM

## 2015-07-08 DIAGNOSIS — Z79899 Other long term (current) drug therapy: Secondary | ICD-10-CM | POA: Insufficient documentation

## 2015-07-08 DIAGNOSIS — K760 Fatty (change of) liver, not elsewhere classified: Secondary | ICD-10-CM

## 2015-07-08 DIAGNOSIS — R112 Nausea with vomiting, unspecified: Secondary | ICD-10-CM

## 2015-07-08 DIAGNOSIS — Z87891 Personal history of nicotine dependence: Secondary | ICD-10-CM | POA: Insufficient documentation

## 2015-07-08 DIAGNOSIS — F1290 Cannabis use, unspecified, uncomplicated: Secondary | ICD-10-CM | POA: Insufficient documentation

## 2015-07-08 DIAGNOSIS — F10120 Alcohol abuse with intoxication, uncomplicated: Secondary | ICD-10-CM | POA: Insufficient documentation

## 2015-07-08 LAB — COMPREHENSIVE METABOLIC PANEL
ALK PHOS: 59 U/L (ref 38–126)
ALT: 74 U/L — AB (ref 17–63)
ANION GAP: 7 (ref 5–15)
AST: 109 U/L — ABNORMAL HIGH (ref 15–41)
Albumin: 4.8 g/dL (ref 3.5–5.0)
BILIRUBIN TOTAL: 0.4 mg/dL (ref 0.3–1.2)
BUN: 9 mg/dL (ref 6–20)
CALCIUM: 9.2 mg/dL (ref 8.9–10.3)
CO2: 30 mmol/L (ref 22–32)
CREATININE: 1.19 mg/dL (ref 0.61–1.24)
Chloride: 107 mmol/L (ref 101–111)
Glucose, Bld: 98 mg/dL (ref 65–99)
Potassium: 3.9 mmol/L (ref 3.5–5.1)
Sodium: 144 mmol/L (ref 135–145)
TOTAL PROTEIN: 7.7 g/dL (ref 6.5–8.1)

## 2015-07-08 LAB — CBC
HEMATOCRIT: 41.5 % (ref 40.0–52.0)
HEMOGLOBIN: 13.6 g/dL (ref 13.0–18.0)
MCH: 28.2 pg (ref 26.0–34.0)
MCHC: 32.7 g/dL (ref 32.0–36.0)
MCV: 86.3 fL (ref 80.0–100.0)
Platelets: 195 10*3/uL (ref 150–440)
RBC: 4.81 MIL/uL (ref 4.40–5.90)
RDW: 14 % (ref 11.5–14.5)
WBC: 4.4 10*3/uL (ref 3.8–10.6)

## 2015-07-08 MED ORDER — ONDANSETRON HCL 4 MG/2ML IJ SOLN: 4.0000 mg | Freq: Once | INTRAMUSCULAR | Status: DC

## 2015-07-08 MED ORDER — SODIUM CHLORIDE 0.9 % IV BOLUS (SEPSIS): 1000.0000 mL | Freq: Once | INTRAVENOUS | Status: DC

## 2015-07-08 MED ORDER — ONDANSETRON 4 MG PO TBDP: 4.0000 mg | ORAL_TABLET | Freq: Three times a day (TID) | ORAL | Status: DC | PRN

## 2015-07-08 NOTE — ED Notes (Signed)
Patient brought to the ED for alcohol intoxication. States today that he has been "through some stuff" that caused him to drink excessively. Girlfriend is concerned because he is vomiting and she is unsure if it was red because it's blood or because it's the red wine. States he drank three half pints of wine. Patient is calm and cooperative at this time. 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 Explained to patient we don't check that in the emergency room now states he wants his alcoholic hepatitis checked.

## 2015-07-08 NOTE — ED Provider Notes (Signed)
[facility] Emergency Department Provider Note  ____________________________________________  Time seen: Approximately 8:25 PM  I have reviewed the triage vital signs and the nursing notes.   HISTORY  Chief Complaint Alcohol Intoxication    HPI Benjamin Beltran is a 30 y.o. male , NAD, presents to the emergency department with his girlfriend to assist with history. Patient states he drank an unknown amount of red wine over the course of 35 minutes earlier today. When asked why he did so he states "because it's good for you". Patient began vomiting and when his girlfriend arrived home from work she noted that the emesis was red tinged. Uncertain if the red color of the emesis was due to the wine or blood. Patient denies any head injuries, falls, headaches, chest pain, shortness breath, abdominal pain. Does have a history of fatty liver but when questioned if he had hepatitis he said no. Patient has not had any episodes of emesis and approximately 3 hours per his girlfriend.   Past Medical History  Diagnosis Date  . Elevated liver function tests unk    There are no active problems to display for this patient.   Past Surgical History  Procedure Laterality Date  . Tendon surgery to left hand  unk    Current Outpatient Rx  Name  Route  Sig  Dispense  Refill  . famotidine (PEPCID) 40 MG tablet   Oral   Take 1 tablet (40 mg total) by mouth every evening.   20 tablet   0   . ibuprofen (ADVIL,MOTRIN) 800 MG tablet   Oral   Take 1 tablet (800 mg total) by mouth every 8 (eight) hours as needed.   30 tablet   0   . ondansetron (ZOFRAN ODT) 4 MG disintegrating tablet   Oral   Take 1 tablet (4 mg total) by mouth every 8 (eight) hours as needed for nausea or vomiting.   8 tablet   0   . penicillin v potassium (VEETID) 500 MG tablet   Oral   Take 1 tablet (500 mg total) by mouth 4 (four) times daily.   40 tablet   0     Allergies Review of patient's  allergies indicates no known allergies.  No family history on file.  Social History Social History  Substance Use Topics  . Smoking status: Former Games developermoker  . Smokeless tobacco: None  . Alcohol Use: Yes     Review of Systems  Constitutional: No fever/chills Cardiovascular: No chest pain. Respiratory: No cough. No shortness of breath. No wheezing.  Gastrointestinal: Positive nausea, vomiting. No abdominal pain.  No diarrhea.  No constipation. Genitourinary: Negative for dysuria, hematuria. No urinary hesitancy, urgency or increased frequency. Musculoskeletal: Negative for back, neck pain.  Skin: Negative for rash. Neurological: Negative for headaches, focal weakness or numbness. No LOC, dizziness. 10-point ROS otherwise negative.  ____________________________________________   PHYSICAL EXAM:  VITAL SIGNS: ED Triage Vitals  Enc Vitals Group     BP 07/08/15 1923 107/59 mmHg     Pulse Rate 07/08/15 1923 62     Resp --      Temp --      Temp src --      SpO2 07/08/15 1923 100 %     Weight 07/08/15 1923 217 lb (98.431 kg)     Height 07/08/15 1923 5\' 11"  (1.803 m)     Head Cir --      Peak Flow --      Pain  Score 07/08/15 1936 0     Pain Loc --      Pain Edu? --      Excl. in GC? --      Constitutional: Patient is sleeping in the exam bed but is easily aroused by verbal and physical stimulation. When he is awake he is Alert and oriented. Well appearing and in no acute distress. Eyes: Conjunctivae are normal.   Head: Atraumatic. Neck: No cervical spine tenderness to palpation Hematological/Lymphatic/Immunilogical: No cervical lymphadenopathy. Cardiovascular: Normal rate, regular rhythm. Normal S1 and S2.  Good peripheral circulation with 2+ pulses noted in bilateral upper extremities. Respiratory: Normal respiratory effort without tachypnea or retractions. Lungs CTAB breath sounds noted in all lung fields. Gastrointestinal: Soft and nontender without distention or  guarding in all quadrants. Grossly normal bowel sounds in all quadrants. Musculoskeletal: No lower extremity tenderness nor edema.  No joint effusions. Neurologic:  Normal speech and language. No gross focal neurologic deficits are appreciated.  Skin:  Skin is warm, dry and intact. No rash or lacerations noted. Psychiatric: Mood and affect are normal. Speech and behavior are normal. Patient exhibits decreased insight and judgement due to intoxication.   ____________________________________________   LABS (all labs ordered are listed, but only abnormal results are displayed)  Labs Reviewed  COMPREHENSIVE METABOLIC PANEL - Abnormal; Notable for the following:    AST 109 (*)    ALT 74 (*)    All other components within normal limits  CBC   ____________________________________________  EKG  None ____________________________________________  RADIOLOGY  None ____________________________________________    PROCEDURES  Procedure(s) performed: None    Medications - No data to display   ____________________________________________   INITIAL IMPRESSION / ASSESSMENT AND PLAN / ED COURSE  Pertinent lab results that were available during my care of the patient were reviewed by me and considered in my medical decision making (see chart for details).  Patient's diagnosis is consistent with Uncomplicated alcohol intoxication with nausea and vomiting that has resolved and history of fatty liver. Patient will be discharged home with prescriptions for Zofran ODT to take as needed for nausea or vomiting. Discussion had with the patient regarding avoidance of all alcohol products due to his fatty liver and elevation of liver enzymes today. Patient is to follow up with Dr. Bluford Kaufmann in gastroenterology to follow-up in regards to fatty liver. Patient is encouraged to establish care with [facility] clinic or primary care services. Patient and his girlfriend at the bedside is given ED precautions to  return to the ED for any worsening or new symptoms. Patient was discharged in the care of his girlfriend who would be transporting the patient home.    ____________________________________________  FINAL CLINICAL IMPRESSION(S) / ED DIAGNOSES  Final diagnoses:  Alcohol intoxication, uncomplicated (HCC)  Non-intractable vomiting with nausea, unspecified vomiting type  Fatty liver      NEW MEDICATIONS STARTED DURING THIS VISIT:  New Prescriptions   ONDANSETRON (ZOFRAN ODT) 4 MG DISINTEGRATING TABLET    Take 1 tablet (4 mg total) by mouth every 8 (eight) hours as needed for nausea or vomiting.      '  Hope Pigeon, PA-C 07/08/15 2118  Gayla Doss, MD 07/08/15 [phone]

## 2015-07-08 NOTE — Discharge Instructions (Signed)
Alcohol Intoxication Alcohol intoxication occurs when the amount of alcohol that a person has consumed impairs his or her ability to mentally and physically function. Alcohol directly impairs the normal chemical activity of the brain. Drinking large amounts of alcohol can lead to changes in mental function and behavior, and it can cause many physical effects that can be harmful.  Alcohol intoxication can range in severity from mild to very severe. Various factors can affect the level of intoxication that occurs, such as the person's age, gender, weight, frequency of alcohol consumption, and the presence of other medical conditions (such as diabetes, seizures, or heart conditions). Dangerous levels of alcohol intoxication may occur when people drink large amounts of alcohol in a short period (binge drinking). Alcohol can also be especially dangerous when combined with certain prescription medicines or "recreational" drugs. SIGNS AND SYMPTOMS Some common signs and symptoms of mild alcohol intoxication include:  Loss of coordination.  Changes in mood and behavior.  Impaired judgment.  Slurred speech. As alcohol intoxication progresses to more severe levels, other signs and symptoms will appear. These may include:  Vomiting.  Confusion and impaired memory.  Slowed breathing.  Seizures.  Loss of consciousness. DIAGNOSIS  Your health care provider will take a medical history and perform a physical exam. You will be asked about the amount and type of alcohol you have consumed. Blood tests will be done to measure the concentration of alcohol in your blood. In many places, your blood alcohol level must be lower than 80 mg/dL (4.09%) to legally drive. However, many dangerous effects of alcohol can occur at much lower levels.  TREATMENT  People with alcohol intoxication often do not require treatment. Most of the effects of alcohol intoxication are temporary, and they go away as the alcohol naturally  leaves the body. Your health care provider will monitor your condition until you are stable enough to go home. Fluids are sometimes given through an IV access tube to help prevent dehydration.  HOME CARE INSTRUCTIONS  Do not drive after drinking alcohol.  Stay hydrated. Drink enough water and fluids to keep your urine clear or pale yellow. Avoid caffeine.   Only take over-the-counter or prescription medicines as directed by your health care provider.  SEEK MEDICAL CARE IF:   You have persistent vomiting.   You do not feel better after a few days.  You have frequent alcohol intoxication. Your health care provider can help determine if you should see a substance use treatment counselor. SEEK IMMEDIATE MEDICAL CARE IF:   You become shaky or tremble when you try to stop drinking.   You shake uncontrollably (seizure).   You throw up (vomit) blood. This may be bright red or may look like black coffee grounds.   You have blood in your stool. This may be bright red or may appear as a black, tarry, bad smelling stool.   You become lightheaded or faint.  MAKE SURE YOU:   Understand these instructions.  Will watch your condition.  Will get help right away if you are not doing well or get worse.   This information is not intended to replace advice given to you by your health care provider. Make sure you discuss any questions you have with your health care provider.   Document Released: 11/16/2004 Document Revised: 10/09/2012 Document Reviewed: 07/12/2012 Elsevier Interactive Patient Education 2016 Elsevier Inc.  Fatty Liver Fatty liver, also called hepatic steatosis or steatohepatitis, is a condition in which too much fat has built up  in your liver cells. The liver removes harmful substances from your bloodstream. It produces fluids your body needs. It also helps your body use and store energy from the food you eat. In many cases, fatty liver does not cause symptoms or problems.  It is often diagnosed when tests are being done for other reasons. However, over time, fatty liver can cause inflammation that may lead to more serious liver problems, such as scarring of the liver (cirrhosis). CAUSES  Causes of fatty liver may include:   Drinking too much alcohol.  Poor nutrition.  Obesity.  Cushing syndrome.  Diabetes.  Hyperlipidemia.  Pregnancy.  Certain drugs.  Poisons.  Some viral infections. RISK FACTORS You may be more likely to develop fatty liver if you:  Abuse alcohol.  Are pregnant.  Are overweight.  Have diabetes.  Have hepatitis.  Have a high triglyceride level.  SIGNS AND SYMPTOMS  Fatty liver often does not cause any symptoms. In cases where symptoms develop, they can include:  Fatigue.  Weakness.  Weight loss.  Confusion.   Abdominal pain.  Yellowing of your skin and the white parts of your eyes (jaundice).  Nausea and vomiting. DIAGNOSIS  Fatty liver may be diagnosed by:   Physical exam and medical history.  Blood tests.  Imaging tests, such as an ultrasound, CT scan, or MRI.  Liver biopsy. A small sample of liver tissue is removed using a needle. The sample is then looked at under a microscope. TREATMENT  Fatty liver is often caused by other health conditions. Treatment for fatty liver may involve medicines and lifestyle changes to manage conditions such as:   Alcoholism.  High cholesterol.  Diabetes.  Being overweight or obese.  HOME CARE INSTRUCTIONS  Eat a healthy diet as directed by your health care provider.  Exercise regularly. This can help you lose weight and control your cholesterol and diabetes. Talk to your health care provider about an exercise plan and which activities are best for you.  Do not drink alcohol.   Take medicines only as directed by your health care provider. SEEK MEDICAL CARE IF: You have difficulty controlling your:  Blood sugar.  Cholesterol.  Alcohol  consumption. SEEK IMMEDIATE MEDICAL CARE IF:  You have abdominal pain.  You have jaundice.  You have nausea and vomiting.   This information is not intended to replace advice given to you by your health care provider. Make sure you discuss any questions you have with your health care provider.   Document Released: 03/24/2005 Document Revised: 02/27/2014 Document Reviewed: 06/18/2013 Elsevier Interactive Patient Education [employer] Elsevier Inc.

## 2015-07-08 NOTE — ED Notes (Signed)
Reviewed d/c instructions, follow-up care, prescription with pt. Pt verbalized understanding 

## 2015-07-28 ENCOUNTER — Emergency Department
Admission: EM | Admit: 2015-07-28 | Discharge: 2015-07-28 | Disposition: A | Discharge status: Home / Self Care | Payer: PRIVATE HEALTH INSURANCE | Attending: Emergency Medicine | Admitting: Emergency Medicine

## 2015-07-28 ENCOUNTER — Encounter: Payer: Self-pay | Admitting: *Deleted

## 2015-07-28 DIAGNOSIS — F17200 Nicotine dependence, unspecified, uncomplicated: Secondary | ICD-10-CM | POA: Insufficient documentation

## 2015-07-28 DIAGNOSIS — K0889 Other specified disorders of teeth and supporting structures: Secondary | ICD-10-CM

## 2015-07-28 DIAGNOSIS — Z792 Long term (current) use of antibiotics: Secondary | ICD-10-CM | POA: Insufficient documentation

## 2015-07-28 DIAGNOSIS — F1290 Cannabis use, unspecified, uncomplicated: Secondary | ICD-10-CM | POA: Insufficient documentation

## 2015-07-28 DIAGNOSIS — K122 Cellulitis and abscess of mouth: Secondary | ICD-10-CM | POA: Insufficient documentation

## 2015-07-28 MED ORDER — LIDOCAINE-EPINEPHRINE 2 %-1:100000 IJ SOLN
1.7000 mL | Freq: Once | INTRAMUSCULAR | Status: AC
  Administered 2015-07-28: 1.7 mL
  Filled 2015-07-28: qty 1.7

## 2015-07-28 MED ORDER — IBUPROFEN 600 MG PO TABS: 600.0000 mg | ORAL_TABLET | Freq: Four times a day (QID) | ORAL | Status: DC | PRN

## 2015-07-28 MED ORDER — AMOXICILLIN 500 MG PO TABS: 500.0000 mg | ORAL_TABLET | Freq: Three times a day (TID) | ORAL | Status: DC

## 2015-07-28 NOTE — ED Provider Notes (Signed)
[facility] Emergency Department Provider Note  ____________________________________________  Time seen: Approximately 11:10 PM  I have reviewed the triage vital signs and the nursing notes.   HISTORY  Chief Complaint Dental Pain    HPI Benjamin Beltran is a 30 y.o. male, NAd, presents to the emergency department with complaint of left upper dental pain. Patient states that irritation began one month ago but it was not enough to prompt evaluation. Yesterday, the pain increased significantly and he found it unbearable. Pain is currently an irritation but is reported to wax and wane with pain ranging from 0/10-10/10. He has not used any form of pharmacologic or non-pharmacologic pain relief. It is exacerbated with smoking and  eating. Patient says when air hits the tooth it sends a shooting pain through the jaw, anterior to the ear, and to the back of his head causing headaches. He does not follow-up with a dentist regularly and reports a similar incidence 12 years ago which ended with a tooth extraction. He denies fevers, chills, night sweats, nausea, vomiting, and abdominal pain.    Past Medical History  Diagnosis Date  . Elevated liver function tests unk    There are no active problems to display for this patient.   Past Surgical History  Procedure Laterality Date  . Tendon surgery to left hand  unk    Current Outpatient Rx  Name  Route  Sig  Dispense  Refill  . amoxicillin (AMOXIL) 500 MG tablet   Oral   Take 1 tablet (500 mg total) by mouth 3 (three) times daily with meals.   30 tablet   0   . famotidine (PEPCID) 40 MG tablet   Oral   Take 1 tablet (40 mg total) by mouth every evening.   20 tablet   0   . ibuprofen (ADVIL,MOTRIN) 600 MG tablet   Oral   Take 1 tablet (600 mg total) by mouth every 6 (six) hours as needed.   30 tablet   0   . ondansetron (ZOFRAN ODT) 4 MG disintegrating tablet   Oral   Take 1 tablet (4 mg total) by mouth  every 8 (eight) hours as needed for nausea or vomiting.   8 tablet   0   . penicillin v potassium (VEETID) 500 MG tablet   Oral   Take 1 tablet (500 mg total) by mouth 4 (four) times daily.   40 tablet   0     Allergies Review of patient's allergies indicates no known allergies.  No family history on file.  Social History Social History  Substance Use Topics  . Smoking status: Current Every Day Smoker  . Smokeless tobacco: None  . Alcohol Use: Yes     Review of Systems  Constitutional: No fever/chills, fatigue ENT: Positive dental pain. No sore throat, hearing changes. Cardiovascular: No chest pain. Respiratory: No shortness of breath. No wheezing.  Gastrointestinal: No abdominal pain.  No nausea, vomiting.  Musculoskeletal: Negative for neck pain.  Skin: Negative for rash, redness, swelling. Neurological: Negative for headaches, focal weakness or numbness. No tingling 10-point ROS otherwise negative.  ____________________________________________   PHYSICAL EXAM:  VITAL SIGNS: ED Triage Vitals  Enc Vitals Group     BP 07/28/15 2158 119/87 mmHg     Pulse Rate 07/28/15 2158 68     Resp 07/28/15 2158 14     Temp 07/28/15 2158 98.1 F (36.7 C)     Temp Source 07/28/15 2158 Oral  SpO2 07/28/15 2158 96 %     Weight 07/28/15 2158 214 lb (97.07 kg)     Height 07/28/15 2158 5\' 11"  (1.803 m)     Head Cir --      Peak Flow --      Pain Score 07/28/15 2201 8     Pain Loc --      Pain Edu? --      Excl. in GC? --      Constitutional: Alert and oriented. Well appearing and in no acute distress. Eyes: Conjunctivae are normal. PERRL. EOMI without pain.  Head: Atraumatic. ENT:      Nose: No congestion/rhinnorhea.      Mouth/Throat: Mucous membranes are moist, poor dentition throughout, significant decay of left upper first molar, tenderness to palpation of right first and second molars. Mild erythema noted around the left upper first molar. Tenderness to  palpation of cheek beneath zygomatic arch.  Neck: Supple with full range of motion Hematological/Lymphatic/Immunilogical: No cervical lymphadenopathy. Respiratory: Normal respiratory effort without tachypnea or retractions.  Neurologic:  Normal speech and language. No gross focal neurologic deficits are appreciated. Gait and posture are normal Skin:  Skin is warm, dry and intact. No rash noted. Psychiatric: Mood and affect are normal. Speech and behavior are normal. Patient exhibits appropriate insight and judgement.   ____________________________________________   LABS  None ____________________________________________  EKG  None ____________________________________________  RADIOLOGY  None ____________________________________________    PROCEDURES  Procedure(s) performed:    Medications  lidocaine-EPINEPHrine (XYLOCAINE W/EPI) 2 %-1:100000 (with pres) injection 1.7 mL (not administered)     ____________________________________________   INITIAL IMPRESSION / ASSESSMENT AND PLAN / ED COURSE  Patient's diagnosis is consistent with Cellulitis of oral soft tissues and dental pain. Patient was given a dental block while in the emergency department with significant relief of pain. Patient will be discharged home with prescriptions for amoxicillin and ibuprofen to take as directed. Patient may take Tylenol in conjunction with these medications as needed for pain. Patient is to follow up with a local dentist if symptoms persist past this treatment course. Patient is given ED precautions to return to the ED for any worsening or new symptoms.    ____________________________________________  FINAL CLINICAL IMPRESSION(S) / ED DIAGNOSES  Final diagnoses:  Cellulitis of oral soft tissues  Pain, dental      NEW MEDICATIONS STARTED DURING THIS VISIT:  New Prescriptions   AMOXICILLIN (AMOXIL) 500 MG TABLET    Take 1 tablet (500 mg total) by mouth 3 (three) times daily  with meals.   IBUPROFEN (ADVIL,MOTRIN) 600 MG TABLET    Take 1 tablet (600 mg total) by mouth every 6 (six) hours as needed.         Hope PigeonJami L Hagler, PA-C 07/28/15 2345  Sharyn CreamerMark Quale, MD 07/29/15 [phone]

## 2015-07-28 NOTE — ED Notes (Signed)
E sig pad not available, pt verbalized understanding  

## 2015-07-28 NOTE — ED Notes (Signed)
Patient c/o left upper dental pain.  

## 2015-07-28 NOTE — Discharge Instructions (Signed)
Dental Care and Dentist Visits °Dental care supports good overall health. Regular dental visits can also help you avoid dental pain, bleeding, infection, and other more serious health problems in the future. It is important to keep the mouth healthy because diseases in the teeth, gums, and other oral tissues can spread to other areas of the body. Some problems, such as diabetes, heart disease, and pre-term labor have been associated with poor oral health.  °See your dentist every 6 months. If you experience emergency problems such as a toothache or broken tooth, go to the dentist right away. If you see your dentist regularly, you may catch problems early. It is easier to be treated for problems in the early stages.  °WHAT TO EXPECT AT A DENTIST VISIT  °Your dentist will look for many common oral health problems and recommend proper treatment. At your regular dental visit, you can expect: °· Gentle cleaning of the teeth and gums. This includes scraping and polishing. This helps to remove the sticky substance around the teeth and gums (plaque). Plaque forms in the mouth shortly after eating. Over time, plaque hardens on the teeth as tartar. If tartar is not removed regularly, it can cause problems. Cleaning also helps remove stains. °· Periodic X-rays. These pictures of the teeth and supporting bone will help your dentist assess the health of your teeth. °· Periodic fluoride treatments. Fluoride is a natural mineral shown to help strengthen teeth. Fluoride treatment involves applying a fluoride gel or varnish to the teeth. It is most commonly done in children. °· Examination of the mouth, tongue, jaws, teeth, and gums to look for any oral health problems, such as: °· Cavities (dental caries). This is decay on the tooth caused by plaque, sugar, and acid in the mouth. It is best to catch a cavity when it is small. °· Inflammation of the gums caused by plaque buildup (gingivitis). °· Problems with the mouth or malformed  or misaligned teeth. °· Oral cancer or other diseases of the soft tissues or jaws.  °KEEP YOUR TEETH AND GUMS HEALTHY °For healthy teeth and gums, follow these general guidelines as well as your dentist's specific advice: °· Have your teeth professionally cleaned at the dentist every 6 months. °· Brush twice daily with a fluoride toothpaste. °· Floss your teeth daily.  °· Ask your dentist if you need fluoride supplements, treatments, or fluoride toothpaste. °· Eat a healthy diet. Reduce foods and drinks with added sugar. °· Avoid smoking. °TREATMENT FOR ORAL HEALTH PROBLEMS °If you have oral health problems, treatment varies depending on the conditions present in your teeth and gums. °· Your caregiver will most likely recommend good oral hygiene at each visit. °· For cavities, gingivitis, or other oral health disease, your caregiver will perform a procedure to treat the problem. This is typically done at a separate appointment. Sometimes your caregiver will refer you to another dental specialist for specific tooth problems or for surgery. °SEEK IMMEDIATE DENTAL CARE IF: °· You have pain, bleeding, or soreness in the gum, tooth, jaw, or mouth area. °· A permanent tooth becomes loose or separated from the gum socket. °· You experience a blow or injury to the mouth or jaw area. °  °This information is not intended to replace advice given to you by your health care provider. Make sure you discuss any questions you have with your health care provider. °  °Document Released: 10/19/2010 Document Revised: 05/01/2011 Document Reviewed: 10/19/2010 °Elsevier Interactive Patient Education ©2016 [employer] ° °Dental Pain °Dental   pain may be caused by many things, including: °· Tooth decay (cavities or caries). Cavities expose the nerve of your tooth to air and hot or cold temperatures. This can cause pain or discomfort. °· Abscess or infection. A dental abscess is a collection of infected pus from a bacterial infection in the  inner part of the tooth (pulp). It usually occurs at the end of the tooth's root. °· Injury. °· An unknown reason (idiopathic). °Your pain may be mild or severe. It may only occur when: °· You are chewing. °· You are exposed to hot or cold temperature. °· You are eating or drinking sugary foods or beverages, such as soda or candy. °Your pain may also be constant. °HOME CARE INSTRUCTIONS °Watch your dental pain for any changes. The following actions may help to lessen any discomfort that you are feeling: °· Take medicines only as directed by your dentist. °· If you were prescribed an antibiotic medicine, finish all of it even if you start to feel better. °· Keep all follow-up visits as directed by your dentist. This is important. °· Do not apply heat to the outside of your face. °· Rinse your mouth or gargle with salt water if directed by your dentist. This helps with pain and swelling. °¨ You can make salt water by adding ¼ tsp of salt to 1 cup of warm water. °· Apply ice to the painful area of your face: °¨ Put ice in a plastic bag. °¨ Place a towel between your skin and the bag. °¨ Leave the ice on for 20 minutes, 2-3 times per day. °· Avoid foods or drinks that cause you pain, such as: °¨ Very hot or very cold foods or drinks. °¨ Sweet or sugary foods or drinks. °SEEK MEDICAL CARE IF: °· Your pain is not controlled with medicines. °· Your symptoms are worse. °· You have new symptoms. °SEEK IMMEDIATE MEDICAL CARE IF: °· You are unable to open your mouth. °· You are having trouble breathing or swallowing. °· You have a fever. °· Your face, neck, or jaw is swollen. °  °This information is not intended to replace advice given to you by your health care provider. Make sure you discuss any questions you have with your health care provider. °  °Document Released: 02/06/2005 Document Revised: 06/23/2014 Document Reviewed: 02/02/2014 °Elsevier Interactive Patient Education ©2016 Elsevier Inc. ° ° °

## 2015-08-10 ENCOUNTER — Encounter: Payer: Self-pay | Admitting: *Deleted

## 2015-08-10 ENCOUNTER — Emergency Department
Admission: EM | Admit: 2015-08-10 | Discharge: 2015-08-10 | Disposition: A | Discharge status: Home / Self Care | Payer: PRIVATE HEALTH INSURANCE | Attending: Emergency Medicine | Admitting: Emergency Medicine

## 2015-08-10 DIAGNOSIS — Z202 Contact with and (suspected) exposure to infections with a predominantly sexual mode of transmission: Secondary | ICD-10-CM | POA: Insufficient documentation

## 2015-08-10 DIAGNOSIS — Z79899 Other long term (current) drug therapy: Secondary | ICD-10-CM | POA: Diagnosis not present

## 2015-08-10 DIAGNOSIS — Z711 Person with feared health complaint in whom no diagnosis is made: Secondary | ICD-10-CM

## 2015-08-10 DIAGNOSIS — F1210 Cannabis abuse, uncomplicated: Secondary | ICD-10-CM | POA: Insufficient documentation

## 2015-08-10 DIAGNOSIS — K029 Dental caries, unspecified: Secondary | ICD-10-CM | POA: Insufficient documentation

## 2015-08-10 DIAGNOSIS — F17200 Nicotine dependence, unspecified, uncomplicated: Secondary | ICD-10-CM | POA: Insufficient documentation

## 2015-08-10 DIAGNOSIS — K0889 Other specified disorders of teeth and supporting structures: Secondary | ICD-10-CM

## 2015-08-10 LAB — CBC WITH DIFFERENTIAL/PLATELET
BASOS ABS: 0 10*3/uL (ref 0–0.1)
BASOS PCT: 1 %
EOS PCT: 5 %
Eosinophils Absolute: 0.3 10*3/uL (ref 0–0.7)
HCT: 39.7 % — ABNORMAL LOW (ref 40.0–52.0)
Hemoglobin: 13.1 g/dL (ref 13.0–18.0)
LYMPHS PCT: 44 %
Lymphs Abs: 2.3 10*3/uL (ref 1.0–3.6)
MCH: 28.8 pg (ref 26.0–34.0)
MCHC: 33 g/dL (ref 32.0–36.0)
MCV: 87.3 fL (ref 80.0–100.0)
MONO ABS: 0.5 10*3/uL (ref 0.2–1.0)
Monocytes Relative: 10 %
Neutro Abs: 2 10*3/uL (ref 1.4–6.5)
Neutrophils Relative %: 40 %
PLATELETS: 177 10*3/uL (ref 150–440)
RBC: 4.55 MIL/uL (ref 4.40–5.90)
RDW: 13.6 % (ref 11.5–14.5)
WBC: 5.1 10*3/uL (ref 3.8–10.6)

## 2015-08-10 LAB — CHLAMYDIA/NGC RT PCR (ARMC ONLY)
CHLAMYDIA TR: DETECTED — AB
N GONORRHOEAE: NOT DETECTED

## 2015-08-10 LAB — COMPREHENSIVE METABOLIC PANEL
ALBUMIN: 4.3 g/dL (ref 3.5–5.0)
ALT: 52 U/L (ref 17–63)
AST: 58 U/L — AB (ref 15–41)
Alkaline Phosphatase: 56 U/L (ref 38–126)
Anion gap: 6 (ref 5–15)
BUN: 13 mg/dL (ref 6–20)
CALCIUM: 8.9 mg/dL (ref 8.9–10.3)
CO2: 30 mmol/L (ref 22–32)
Chloride: 105 mmol/L (ref 101–111)
Creatinine, Ser: 1.16 mg/dL (ref 0.61–1.24)
GFR calc Af Amer: >60 mL/min (ref >60)
Glucose, Bld: 106 mg/dL — ABNORMAL HIGH (ref 65–99)
POTASSIUM: 4.1 mmol/L (ref 3.5–5.1)
SODIUM: 141 mmol/L (ref 135–145)
TOTAL PROTEIN: 6.9 g/dL (ref 6.5–8.1)
Total Bilirubin: 0.3 mg/dL (ref 0.3–1.2)

## 2015-08-10 LAB — URINALYSIS COMPLETE WITH MICROSCOPIC (ARMC ONLY)
BILIRUBIN URINE: NEGATIVE
Bacteria, UA: NONE SEEN
GLUCOSE, UA: NEGATIVE mg/dL
HGB URINE DIPSTICK: NEGATIVE
KETONES UR: NEGATIVE mg/dL
NITRITE: NEGATIVE
PH: 6 (ref 5.0–8.0)
Protein, ur: NEGATIVE mg/dL
RBC / HPF: NONE SEEN RBC/hpf (ref 0–5)
SPECIFIC GRAVITY, URINE: 1.02 (ref 1.005–1.030)
SQUAMOUS EPITHELIAL / LPF: NONE SEEN

## 2015-08-10 MED ORDER — CEFTRIAXONE SODIUM 250 MG IJ SOLR
250.0000 mg | Freq: Once | INTRAMUSCULAR | Status: AC
  Administered 2015-08-10: 250 mg via INTRAMUSCULAR
  Filled 2015-08-10: qty 250

## 2015-08-10 MED ORDER — METRONIDAZOLE 500 MG PO TABS
2000.0000 mg | ORAL_TABLET | Freq: Once | ORAL | Status: AC
  Administered 2015-08-10: 2000 mg via ORAL
  Filled 2015-08-10: qty 4

## 2015-08-10 MED ORDER — AZITHROMYCIN 1 G PO PACK
1.0000 g | PACK | Freq: Once | ORAL | Status: AC
  Administered 2015-08-10: 1 g via ORAL
  Filled 2015-08-10: qty 1

## 2015-08-10 MED ORDER — LIDOCAINE HCL (PF) 1 % IJ SOLN
0.9000 mL | Freq: Once | INTRAMUSCULAR | Status: AC
  Administered 2015-08-10: 0.9 mL
  Filled 2015-08-10: qty 5

## 2015-08-10 MED ORDER — ONDANSETRON 4 MG PO TBDP
4.0000 mg | ORAL_TABLET | Freq: Once | ORAL | Status: AC
  Administered 2015-08-10: 4 mg via ORAL
  Filled 2015-08-10: qty 1

## 2015-08-10 MED ORDER — AMOXICILLIN 500 MG PO CAPS: 500.0000 mg | ORAL_CAPSULE | Freq: Once | ORAL | Status: DC

## 2015-08-10 NOTE — Discharge Instructions (Signed)
1. Fill the prescription previously given to you and take the antibiotic as prescribed. 2. You have been treated with antibiotics in the emergency department for your concerns for STD exposure. 3. You will be notified of any positive test results. If these are positive, you must inform your sexual partner or partners to be tested at the health Department. 4. Return to the ER for worsening symptoms, persistent vomiting, fever or other concerns.  Dental Caries Dental caries is tooth decay. This decay can cause a hole in teeth (cavity) that can get bigger and deeper over time. HOME CARE  Brush and floss your teeth. Do this at least two times a day.  Use a fluoride toothpaste.  Use a mouth rinse if told by your dentist or doctor.  Eat less sugary and starchy foods. Drink less sugary drinks.  Avoid snacking often on sugary and starchy foods. Avoid sipping often on sugary drinks.  Keep regular checkups and cleanings with your dentist.  Use fluoride supplements if told by your dentist or doctor.  Allow fluoride to be applied to teeth if told by your dentist or doctor.   This information is not intended to replace advice given to you by your health care provider. Make sure you discuss any questions you have with your health care provider.   Document Released: 11/16/2007 Document Revised: 02/27/2014 Document Reviewed: 02/09/2012 Elsevier Interactive Patient Education [employer]2016 Elsevier Inc.

## 2015-08-10 NOTE — ED Notes (Signed)
Pt has left upper and lower tooth ache  Sx for 2 weeks.  Pt also reports right groin pain for 2 weeks.  Intermittent pain.  No n/v/d.  No back pain no diff urinating.

## 2015-08-10 NOTE — ED Provider Notes (Signed)
[facility] Emergency Department Provider Note   ____________________________________________  Time seen: Approximately 5:16 AM  I have reviewed the triage vital signs and the nursing notes.   HISTORY  Chief Complaint Dental Pain and Groin Pain    HPI Benjamin Beltran is a 30 y.o. male who presents to the ED from home with a chief complain of dental pain and right groin pain. Patient was seen in the emergency department on 6/7 for left upper dental pain. He was prescribed amoxicillin but did not fill the prescription because he thought the medicine would be expensive. Has not followed up with dentist as instructed. Patient also reports "knot" to right groin 2 weeks ago. Patient is concerned for STD exposure as he does not use protection. Specifically, patient is concerned for HIV exposure. Reports penile discharge and dysuria. Denies known exposure, just states "it's been a while since I then tested". Denies fever, chills, chest pain, shortness of breath, weight loss, abdominal pain, nausea, vomiting, diarrhea. Eyes recent travel or trauma. Nothing makes his symptoms better or worse.   Past Medical History  Diagnosis Date  . Elevated liver function tests unk    There are no active problems to display for this patient.   Past Surgical History  Procedure Laterality Date  . Tendon surgery to left hand  unk    Current Outpatient Rx  Name  Route  Sig  Dispense  Refill  . amoxicillin (AMOXIL) 500 MG tablet   Oral   Take 1 tablet (500 mg total) by mouth 3 (three) times daily with meals.   30 tablet   0   . famotidine (PEPCID) 40 MG tablet   Oral   Take 1 tablet (40 mg total) by mouth every evening.   20 tablet   0   . ibuprofen (ADVIL,MOTRIN) 600 MG tablet   Oral   Take 1 tablet (600 mg total) by mouth every 6 (six) hours as needed.   30 tablet   0   . ondansetron (ZOFRAN ODT) 4 MG disintegrating tablet   Oral   Take 1 tablet (4 mg total) by  mouth every 8 (eight) hours as needed for nausea or vomiting.   8 tablet   0   . penicillin v potassium (VEETID) 500 MG tablet   Oral   Take 1 tablet (500 mg total) by mouth 4 (four) times daily.   40 tablet   0     Allergies Review of patient's allergies indicates no known allergies.  No family history on file.  Social History Social History  Substance Use Topics  . Smoking status: Current Every Day Smoker  . Smokeless tobacco: None  . Alcohol Use: Yes    Review of Systems  Constitutional: No fever/chills. Eyes: No visual changes. ENT: Positive for dental pain. No sore throat. Cardiovascular: Denies chest pain. Respiratory: Denies shortness of breath. Gastrointestinal: No abdominal pain.  No nausea, no vomiting.  No diarrhea.  No constipation. Genitourinary: Positive for penile discharge and dysuria. Musculoskeletal: Negative for back pain. Skin: Negative for rash. Neurological: Negative for headaches, focal weakness or numbness.  10-point ROS otherwise negative.  ____________________________________________   PHYSICAL EXAM:  VITAL SIGNS: ED Triage Vitals  Enc Vitals Group     BP 08/10/15 0040 121/53 mmHg     Pulse Rate 08/10/15 0040 60     Resp 08/10/15 0040 18     Temp 08/10/15 0040 98.3 F (36.8 C)     Temp Source 08/10/15  0040 Oral     SpO2 08/10/15 0040 99 %     Weight 08/10/15 0040 218 lb (98.884 kg)     Height 08/10/15 0040 5\' 11"  (1.803 m)     Head Cir --      Peak Flow --      Pain Score 08/10/15 0042 8     Pain Loc --      Pain Edu? --      Excl. in GC? --     Constitutional: Alert and oriented. Well appearing and in no acute distress. Eyes: Conjunctivae are normal. PERRL. EOMI. Head: Atraumatic. Nose: No congestion/rhinnorhea. Mouth/Throat: Left upper molars with dental caries. No abscess. Mucous membranes are moist.  Oropharynx non-erythematous. Neck: No stridor.   Cardiovascular: Normal rate, regular rhythm. Grossly normal heart  sounds.  Good peripheral circulation. Respiratory: Normal respiratory effort.  No retractions. Lungs CTAB. Gastrointestinal: Soft and nontender. No distention. No abdominal bruits. No CVA tenderness. Genitourinary: Circumcised male. No penile discharge. Bilaterally distended testicles which are nontender and nonswollen. Strong bilateral cremasteric reflexes. Small palpable in freely mobile right lymph node. Musculoskeletal: No lower extremity tenderness nor edema.  No joint effusions. Neurologic:  Normal speech and language. No gross focal neurologic deficits are appreciated. No gait instability. Skin:  Skin is warm, dry and intact. No rash noted. Psychiatric: Mood and affect are normal. Speech and behavior are normal.  ____________________________________________   LABS (all labs ordered are listed, but only abnormal results are displayed)  Labs Reviewed  COMPREHENSIVE METABOLIC PANEL - Abnormal; Notable for the following:    Glucose, Bld 106 (*)    AST 58 (*)    All other components within normal limits  CBC WITH DIFFERENTIAL/PLATELET - Abnormal; Notable for the following:    HCT 39.7 (*)    All other components within normal limits  URINALYSIS COMPLETEWITH MICROSCOPIC ([facility] ONLY) - Abnormal; Notable for the following:    Color, Urine YELLOW (*)    APPearance CLEAR (*)    Leukocytes, UA TRACE (*)    All other components within normal limits  CHLAMYDIA/NGC RT PCR ([facility] ONLY)  HIV ANTIBODY (ROUTINE TESTING)   ____________________________________________  EKG  None ____________________________________________  RADIOLOGY  None ____________________________________________   PROCEDURES  Procedure(s) performed: None  Critical Care performed: No  ____________________________________________   INITIAL IMPRESSION / ASSESSMENT AND PLAN / ED COURSE  Pertinent labs & imaging results that were available during my care of the patient were reviewed by me and  considered in my medical decision making (see chart for details).  30 year old male who presents with dental pain and right inguinal lymphadenopathy with concern for STD exposure. In particular, patient is concern for HIV exposure and requests testing this morning. States "this is the only time I get to go to the doctor so I need everything done here". Patient still has his prescription for amoxicillin. I have informed him that this medicine is on the [facility] $4 list and urged him to get it filled. Given his concern for STD, will treat empirically in the emergency department. I have counseled him that he will be notified of any positive STD or HIV results and that he should either use a condom or abstain from sexual intercourse with his partner or partners until he receives confirmatory results. Strict return precautions given. Patient verbalizes understanding and agrees with plan of care. ____________________________________________   FINAL CLINICAL IMPRESSION(S) / ED DIAGNOSES  Final diagnoses:  Dentalgia  Dental caries  Concern about STD in male without  diagnosis      NEW MEDICATIONS STARTED DURING THIS VISIT:  New Prescriptions   No medications on file     Note:  This document was prepared using Dragon voice recognition software and may include unintentional dictation errors.    Irean HongJade J Sung, MD 08/10/15 [phone]

## 2015-08-11 ENCOUNTER — Telehealth: Payer: Self-pay | Admitting: Emergency Medicine

## 2015-08-11 LAB — HIV ANTIBODY (ROUTINE TESTING W REFLEX): HIV Screen 4th Generation wRfx: NONREACTIVE

## 2015-08-11 NOTE — ED Notes (Addendum)
Message left requesting patient to call the ED at [phone]. Patient has positive Chlamydia results and needs to be advised of this.  Patient returned call and was positively identified with name, DOB, and last 4 # of SSN. Advised of positive Chlamydia results and that he had been treated during his ED visit for same. Advised to abstain from sex X 10 days, advise his partners of the results, and to followup with the [facility] and that he could return to the ED if further problems. Patient given phone number of the HD STI clinic.

## 2015-09-03 ENCOUNTER — Encounter: Payer: Self-pay | Admitting: Urgent Care

## 2015-09-03 ENCOUNTER — Emergency Department
Admission: EM | Admit: 2015-09-03 | Discharge: 2015-09-03 | Disposition: A | Discharge status: Home / Self Care | Payer: Self-pay | Attending: Emergency Medicine | Admitting: Emergency Medicine

## 2015-09-03 ENCOUNTER — Emergency Department: Payer: Self-pay

## 2015-09-03 DIAGNOSIS — F1290 Cannabis use, unspecified, uncomplicated: Secondary | ICD-10-CM | POA: Insufficient documentation

## 2015-09-03 DIAGNOSIS — F17200 Nicotine dependence, unspecified, uncomplicated: Secondary | ICD-10-CM | POA: Insufficient documentation

## 2015-09-03 DIAGNOSIS — R079 Chest pain, unspecified: Secondary | ICD-10-CM | POA: Insufficient documentation

## 2015-09-03 DIAGNOSIS — Z720 Tobacco use: Secondary | ICD-10-CM

## 2015-09-03 LAB — CBC WITH DIFFERENTIAL/PLATELET
BASOS PCT: 1 %
Basophils Absolute: 0 10*3/uL (ref 0–0.1)
Eosinophils Absolute: 0.2 10*3/uL (ref 0–0.7)
Eosinophils Relative: 3 %
HEMATOCRIT: 37.4 % — AB (ref 40.0–52.0)
HEMOGLOBIN: 12.9 g/dL — AB (ref 13.0–18.0)
LYMPHS ABS: 1.7 10*3/uL (ref 1.0–3.6)
Lymphocytes Relative: 35 %
MCH: 29.3 pg (ref 26.0–34.0)
MCHC: 34.4 g/dL (ref 32.0–36.0)
MCV: 85.3 fL (ref 80.0–100.0)
MONOS PCT: 8 %
Monocytes Absolute: 0.4 10*3/uL (ref 0.2–1.0)
NEUTROS ABS: 2.7 10*3/uL (ref 1.4–6.5)
NEUTROS PCT: 53 %
Platelets: 179 10*3/uL (ref 150–440)
RBC: 4.38 MIL/uL — AB (ref 4.40–5.90)
RDW: 13.7 % (ref 11.5–14.5)
WBC: 5 10*3/uL (ref 3.8–10.6)

## 2015-09-03 LAB — BASIC METABOLIC PANEL
Anion gap: 7 (ref 5–15)
BUN: 14 mg/dL (ref 6–20)
CHLORIDE: 103 mmol/L (ref 101–111)
CO2: 27 mmol/L (ref 22–32)
CREATININE: 1.12 mg/dL (ref 0.61–1.24)
Calcium: 8.5 mg/dL — ABNORMAL LOW (ref 8.9–10.3)
GFR calc non Af Amer: >60 mL/min (ref >60)
Glucose, Bld: 106 mg/dL — ABNORMAL HIGH (ref 65–99)
POTASSIUM: 3.6 mmol/L (ref 3.5–5.1)
Sodium: 137 mmol/L (ref 135–145)

## 2015-09-03 LAB — TROPONIN I: Troponin I: <0.03 ng/mL (ref <0.03)

## 2015-09-03 MED ORDER — BUPROPION HCL ER (SR) 150 MG PO TB12: ORAL_TABLET | ORAL | Status: DC

## 2015-09-03 NOTE — Discharge Instructions (Signed)
Please seek medical attention for any high fevers, chest pain, shortness of breath, change in behavior, persistent vomiting, bloody stool or any other new or concerning symptoms.   Nonspecific Chest Pain It is often hard to find the cause of chest pain. There is always a chance that your pain could be related to something serious, such as a heart attack or a blood clot in your lungs. Chest pain can also be caused by conditions that are not life-threatening. If you have chest pain, it is very important to follow up with your doctor.  HOME CARE  If you were prescribed an antibiotic medicine, finish it all even if you start to feel better.  Avoid any activities that cause chest pain.  Do not use any tobacco products, including cigarettes, chewing tobacco, or electronic cigarettes. If you need help quitting, ask your doctor.  Do not drink alcohol.  Take medicines only as told by your doctor.  Keep all follow-up visits as told by your doctor. This is important. This includes any further testing if your chest pain does not go away.  Your doctor may tell you to keep your head raised (elevated) while you sleep.  Make lifestyle changes as told by your doctor. These may include:  Getting regular exercise. Ask your doctor to suggest some activities that are safe for you.  Eating a heart-healthy diet. Your doctor or a diet specialist (dietitian) can help you to learn healthy eating options.  Maintaining a healthy weight.  Managing diabetes, if necessary.  Reducing stress. GET HELP IF:  Your chest pain does not go away, even after treatment.  You have a rash with blisters on your chest.  You have a fever. GET HELP RIGHT AWAY IF:  Your chest pain is worse.  You have an increasing cough, or you cough up blood.  You have severe belly (abdominal) pain.  You feel extremely weak.  You pass out (faint).  You have chills.  You have sudden, unexplained chest discomfort.  You have  sudden, unexplained discomfort in your arms, back, neck, or jaw.  You have shortness of breath at any time.  You suddenly start to sweat, or your skin gets clammy.  You feel nauseous.  You vomit.  You suddenly feel light-headed or dizzy.  Your heart begins to beat quickly, or it feels like it is skipping beats. These symptoms may be an emergency. Do not wait to see if the symptoms will go away. Get medical help right away. Call your local emergency services (911 in the [place] Do not drive yourself to the hospital.   This information is not intended to replace advice given to you by your health care provider. Make sure you discuss any questions you have with your health care provider.   Document Released: 07/26/2007 Document Revised: 02/27/2014 Document Reviewed: 09/12/2013 Elsevier Interactive Patient Education 2016 [employer]  Smoking Cessation, Tips for Success If you are ready to quit smoking, congratulations! You have chosen to help yourself be healthier. Cigarettes bring nicotine, tar, carbon monoxide, and other irritants into your body. Your lungs, heart, and blood vessels will be able to work better without these poisons. There are many different ways to quit smoking. Nicotine gum, nicotine patches, a nicotine inhaler, or nicotine nasal spray can help with physical craving. Hypnosis, support groups, and medicines help break the habit of smoking. WHAT THINGS CAN I DO TO MAKE QUITTING EASIER?  Here are some tips to help you quit for good:  Pick a date  when you will quit smoking completely. Tell all of your friends and family about your plan to quit on that date.  Do not try to slowly cut down on the number of cigarettes you are smoking. Pick a quit date and quit smoking completely starting on that day.  Throw away all cigarettes.   Clean and remove all ashtrays from your home, work, and car.  On a card, write down your reasons for quitting. Carry the card with you and read  it when you get the urge to smoke.  Cleanse your body of nicotine. Drink enough water and fluids to keep your urine clear or pale yellow. Do this after quitting to flush the nicotine from your body.  Learn to predict your moods. Do not let a bad situation be your excuse to have a cigarette. Some situations in your life might tempt you into wanting a cigarette.  Never have "just one" cigarette. It leads to wanting another and another. Remind yourself of your decision to quit.  Change habits associated with smoking. If you smoked while driving or when feeling stressed, try other activities to replace smoking. Stand up when drinking your coffee. Brush your teeth after eating. Sit in a different chair when you read the paper. Avoid alcohol while trying to quit, and try to drink fewer caffeinated beverages. Alcohol and caffeine may urge you to smoke.  Avoid foods and drinks that can trigger a desire to smoke, such as sugary or spicy foods and alcohol.  Ask people who smoke not to smoke around you.  Have something planned to do right after eating or having a cup of coffee. For example, plan to take a walk or exercise.  Try a relaxation exercise to calm you down and decrease your stress. Remember, you may be tense and nervous for the first 2 weeks after you quit, but this will pass.  Find new activities to keep your hands busy. Play with a pen, coin, or rubber band. Doodle or draw things on paper.  Brush your teeth right after eating. This will help cut down on the craving for the taste of tobacco after meals. You can also try mouthwash.   Use oral substitutes in place of cigarettes. Try using lemon drops, carrots, cinnamon sticks, or chewing gum. Keep them handy so they are available when you have the urge to smoke.  When you have the urge to smoke, try deep breathing.  Designate your home as a nonsmoking area.  If you are a heavy smoker, ask your health care provider about a prescription for  nicotine chewing gum. It can ease your withdrawal from nicotine.  Reward yourself. Set aside the cigarette money you save and buy yourself something nice.  Look for support from others. Join a support group or smoking cessation program. Ask someone at home or at work to help you with your plan to quit smoking.  Always ask yourself, "Do I need this cigarette or is this just a reflex?" Tell yourself, "Today, I choose not to smoke," or "I do not want to smoke." You are reminding yourself of your decision to quit.  Do not replace cigarette smoking with electronic cigarettes (commonly called e-cigarettes). The safety of e-cigarettes is unknown, and some may contain harmful chemicals.  If you relapse, do not give up! Plan ahead and think about what you will do the next time you get the urge to smoke. HOW WILL I FEEL WHEN I QUIT SMOKING? You may have symptoms of withdrawal  because your body is used to nicotine (the addictive substance in cigarettes). You may crave cigarettes, be irritable, feel very hungry, cough often, get headaches, or have difficulty concentrating. The withdrawal symptoms are only temporary. They are strongest when you first quit but will go away within 10-14 days. When withdrawal symptoms occur, stay in control. Think about your reasons for quitting. Remind yourself that these are signs that your body is healing and getting used to being without cigarettes. Remember that withdrawal symptoms are easier to treat than the major diseases that smoking can cause.  Even after the withdrawal is over, expect periodic urges to smoke. However, these cravings are generally short lived and will go away whether you smoke or not. Do not smoke! WHAT RESOURCES ARE AVAILABLE TO HELP ME QUIT SMOKING? Your health care provider can direct you to community resources or hospitals for support, which may include:  Group support.  Education.  Hypnosis.  Therapy.   This information is not intended to  replace advice given to you by your health care provider. Make sure you discuss any questions you have with your health care provider.   Document Released: 11/05/2003 Document Revised: 02/27/2014 Document Reviewed: 07/25/2012 Elsevier Interactive Patient Education [employer]

## 2015-09-03 NOTE — ED Notes (Signed)
E-signature box not working. Pt verbalized understanding of discharge instructions and denied questions. 

## 2015-09-03 NOTE — ED Provider Notes (Signed)
[facility] Emergency Department Provider Note   ____________________________________________  Time seen: ~0610  I have reviewed the triage vital signs and the nursing notes.   HISTORY  Chief Complaint Chest Pain and Shortness of Breath   History limited by: Not Limited   HPI Benjamin Beltran is a 30 y.o. male who presents to the emergency department today because of concerns for chest pain and shortness breath. The chest pain is located in the center and left side of his chest. He states that it happens after he smokes cigarettes. He recently restarted smoking cigarettes.The pain lasts for about 30 minutes after he smokes. He has had a dry cough with this. He denies any fevers.   Past Medical History  Diagnosis Date  . Elevated liver function tests unk    There are no active problems to display for this patient.   Past Surgical History  Procedure Laterality Date  . Tendon surgery to left hand  unk    Current Outpatient Rx  Name  Route  Sig  Dispense  Refill  . amoxicillin (AMOXIL) 500 MG tablet   Oral   Take 1 tablet (500 mg total) by mouth 3 (three) times daily with meals.   30 tablet   0   . famotidine (PEPCID) 40 MG tablet   Oral   Take 1 tablet (40 mg total) by mouth every evening.   20 tablet   0   . ibuprofen (ADVIL,MOTRIN) 600 MG tablet   Oral   Take 1 tablet (600 mg total) by mouth every 6 (six) hours as needed.   30 tablet   0   . ondansetron (ZOFRAN ODT) 4 MG disintegrating tablet   Oral   Take 1 tablet (4 mg total) by mouth every 8 (eight) hours as needed for nausea or vomiting.   8 tablet   0   . penicillin v potassium (VEETID) 500 MG tablet   Oral   Take 1 tablet (500 mg total) by mouth 4 (four) times daily.   40 tablet   0     Allergies Review of patient's allergies indicates no known allergies.  No family history on file.  Social History Social History  Substance Use Topics  . Smoking status: Current  Every Day Smoker  . Smokeless tobacco: None  . Alcohol Use: Yes    Review of Systems  Constitutional: Negative for fever. Cardiovascular: Positive for chest pain. Respiratory: Positive for shortness of breath. Gastrointestinal: Negative for abdominal pain, vomiting and diarrhea. Genitourinary: Negative for dysuria. Musculoskeletal: Negative for back pain. Skin: Negative for rash. Neurological: Negative for headaches, focal weakness or numbness.   10-point ROS otherwise negative.  ____________________________________________   PHYSICAL EXAM:  VITAL SIGNS: ED Triage Vitals  Enc Vitals Group     BP 09/03/15 0600 136/60 mmHg     Pulse Rate 09/03/15 0600 69     Resp 09/03/15 0600 16     Temp 09/03/15 0600 97.9 F (36.6 C)     Temp Source 09/03/15 0600 Oral     SpO2 09/03/15 0600 98 %     Weight 09/03/15 0600 218 lb (98.884 kg)     Height 09/03/15 0600 5\' 11"  (1.803 m)     Head Cir --      Peak Flow --      Pain Score 09/03/15 0600 7   Constitutional: Alert and oriented. Well appearing and in no distress. Eyes: Conjunctivae are normal. PERRL. Normal extraocular movements. ENT  Head: Normocephalic and atraumatic.   Nose: No congestion/rhinnorhea.   Mouth/Throat: Mucous membranes are moist.   Neck: No stridor. Hematological/Lymphatic/Immunilogical: No cervical lymphadenopathy. Cardiovascular: Normal rate, regular rhythm.  No murmurs, rubs, or gallops. Respiratory: Normal respiratory effort without tachypnea nor retractions. Breath sounds are clear and equal bilaterally. No wheezes/rales/rhonchi. Gastrointestinal: Soft and nontender. No distention.  Genitourinary: Deferred Musculoskeletal: Normal range of motion in all extremities. No joint effusions.  No lower extremity tenderness nor edema. Neurologic:  Normal speech and language. No gross focal neurologic deficits are appreciated.  Skin:  Skin is warm, dry and intact. No rash noted. Psychiatric: Mood and  affect are normal. Speech and behavior are normal. Patient exhibits appropriate insight and judgment.  ____________________________________________    LABS (pertinent positives/negatives)  Labs Reviewed  CBC WITH DIFFERENTIAL/PLATELET - Abnormal; Notable for the following:    RBC 4.38 (*)    Hemoglobin 12.9 (*)    HCT 37.4 (*)    All other components within normal limits  BASIC METABOLIC PANEL - Abnormal; Notable for the following:    Glucose, Bld 106 (*)    Calcium 8.5 (*)    All other components within normal limits  TROPONIN I     ____________________________________________   EKG  I, Phineas Semen, attending physician, personally viewed and interpreted this EKG  EKG Time: 0554 Rate: 56 Rhythm: sinus bradycardia Axis: normal Intervals: qtc 443 QRS: narrow ST changes: some j point elevation Impression: abnormal ekg   ____________________________________________    RADIOLOGY   CXR  IMPRESSION: No active cardiopulmonary disease.  ____________________________________________   PROCEDURES  Procedure(s) performed: None  Critical Care performed: No  ____________________________________________   INITIAL IMPRESSION / ASSESSMENT AND PLAN / ED COURSE  Pertinent labs & imaging results that were available during my care of the patient were reviewed by me and considered in my medical decision making (see chart for details).  His presented to the emergency department today because of concerns for chest pain shortness breath that typically happen after he woke cigarettes. Blood work chest x-ray and EKG without concerning findings. Patient did request bupropion for smoking sensation. I did prescribe him. I discussed the risks of the program including seizures and mood changes. Additionally I discussed with patient that his set his target quit date a week after starting this medication.  ____________________________________________   FINAL CLINICAL  IMPRESSION(S) / ED DIAGNOSES  Final diagnoses:  Chest pain, unspecified chest pain type  Tobacco use     Note: This dictation was prepared with Dragon dictation. Any transcriptional errors that result from this process are unintentional    Phineas Semen, MD 09/03/15 [phone]

## 2015-09-03 NOTE — ED Notes (Signed)
Patient presents with c/o LEFT sided chest pain with (+) SOB and nausea that began around 2300. Patient reports that this same pain has been happening on an intermittent basis for awhile, but worse tonight.

## 2015-10-11 DIAGNOSIS — M5020 Other cervical disc displacement, unspecified cervical region: Secondary | ICD-10-CM | POA: Insufficient documentation

## 2015-10-11 DIAGNOSIS — X501XXA Overexertion from prolonged static or awkward postures, initial encounter: Secondary | ICD-10-CM | POA: Diagnosis not present

## 2015-10-11 DIAGNOSIS — Y999 Unspecified external cause status: Secondary | ICD-10-CM | POA: Insufficient documentation

## 2015-10-11 DIAGNOSIS — Y9389 Activity, other specified: Secondary | ICD-10-CM | POA: Insufficient documentation

## 2015-10-11 DIAGNOSIS — F17210 Nicotine dependence, cigarettes, uncomplicated: Secondary | ICD-10-CM | POA: Insufficient documentation

## 2015-10-11 DIAGNOSIS — Y929 Unspecified place or not applicable: Secondary | ICD-10-CM | POA: Insufficient documentation

## 2015-10-11 DIAGNOSIS — Z79899 Other long term (current) drug therapy: Secondary | ICD-10-CM | POA: Diagnosis not present

## 2015-10-11 DIAGNOSIS — S199XXA Unspecified injury of neck, initial encounter: Secondary | ICD-10-CM | POA: Diagnosis present

## 2015-10-12 ENCOUNTER — Emergency Department: Payer: Worker's Compensation

## 2015-10-12 ENCOUNTER — Encounter: Payer: Self-pay | Admitting: Emergency Medicine

## 2015-10-12 ENCOUNTER — Emergency Department
Admission: EM | Admit: 2015-10-12 | Discharge: 2015-10-12 | Disposition: A | Discharge status: Home / Self Care | Payer: Worker's Compensation | Attending: Emergency Medicine | Admitting: Emergency Medicine

## 2015-10-12 DIAGNOSIS — M5020 Other cervical disc displacement, unspecified cervical region: Secondary | ICD-10-CM

## 2015-10-12 MED ORDER — OXYCODONE-ACETAMINOPHEN 5-325 MG PO TABS
1.0000 | ORAL_TABLET | Freq: Once | ORAL | Status: AC
  Administered 2015-10-12: 1 via ORAL
  Filled 2015-10-12: qty 1

## 2015-10-12 MED ORDER — METHYLPREDNISOLONE 4 MG PO TBPK: ORAL_TABLET | ORAL | 0 refills | Status: DC

## 2015-10-12 MED ORDER — PREDNISONE 20 MG PO TABS
60.0000 mg | ORAL_TABLET | Freq: Once | ORAL | Status: AC
  Administered 2015-10-12: 60 mg via ORAL
  Filled 2015-10-12: qty 3

## 2015-10-12 NOTE — ED Notes (Signed)
Pt ambulates to restroom without difficulty, steady gait noted.  

## 2015-10-12 NOTE — ED Triage Notes (Addendum)
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

## 2015-10-12 NOTE — ED Provider Notes (Signed)
[facility] Emergency Department Provider Note   ____________________________________________   First MD Initiated Contact with Patient 10/12/15 0127     (approximate)  I have reviewed the triage vital signs and the nursing notes.   HISTORY  Chief Complaint Neck Injury   HPI Benjamin Beltran is a 30 y.o. male with a history of elevated LFTs was presenting to the emergency department with neck pain. 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 He said that he did a motion while operating a crane where he quickly jerked both hands down extending his arms and then extended his neck. He says he felt a pop in the midline of his neck and is having pain there at this time. He says it hurts to move his neck/head to the left of the right and can only do so to a certain point before he needs to stop because of pain. He denies any weakness or numbness.   Past Medical History:  Diagnosis Date  . Elevated liver function tests unk    There are no active problems to display for this patient.   Past Surgical History:  Procedure Laterality Date  . tendon surgery to left hand  unk    Prior to Admission medications   Medication Sig Start Date End Date Taking? Authorizing Provider  amoxicillin (AMOXIL) 500 MG tablet Take 1 tablet (500 mg total) by mouth 3 (three) times daily with meals. Patient not taking: Reported on 09/03/2015 07/28/15   Jami L Hagler, PA-C  buPROPion (WELLBUTRIN SR) 150 MG 12 hr tablet Take 1 tablet (150 mg) orally daily for the first three days. Then take 1 tablet (150 mg) orally BID (total of 300 mg daily) daily 09/03/15 10/04/15  Phineas SemenGraydon Goodman, MD  famotidine (PEPCID) 40 MG tablet Take 1 tablet (40 mg total) by mouth every evening. 03/01/15 02/29/16  Rebecka ApleyAllison P Webster, MD  ibuprofen (ADVIL,MOTRIN) 600 MG tablet Take 1 tablet (600 mg total) by mouth every 6 (six) hours as needed. 07/28/15   Jami L Hagler, PA-C  ondansetron  (ZOFRAN ODT) 4 MG disintegrating tablet Take 1 tablet (4 mg total) by mouth every 8 (eight) hours as needed for nausea or vomiting. Patient not taking: Reported on 09/03/2015 07/08/15   Jami L Hagler, PA-C  penicillin v potassium (VEETID) 500 MG tablet Take 1 tablet (500 mg total) by mouth 4 (four) times daily. Patient not taking: Reported on 09/03/2015 04/19/15   Charlesetta IvoryJenise V Bacon Menshew, PA-C    Allergies Review of patient's allergies indicates no known allergies.  No family history on file.  Social History Social History  Substance Use Topics  . Smoking status: Current Every Day Smoker    Packs/day: 0.50    Types: Cigarettes  . Smokeless tobacco: Never Used  . Alcohol use Yes    Review of Systems Constitutional: No fever/chills Eyes: No visual changes. ENT: No sore throat. Cardiovascular: Denies chest pain. Respiratory: Denies shortness of breath. Gastrointestinal: No abdominal pain.  No nausea, no vomiting.  No diarrhea.  No constipation. Genitourinary: Negative for dysuria. Musculoskeletal: Negative for back pain. Skin: Negative for rash. Neurological: Negative for headaches, focal weakness or numbness.  10-point ROS otherwise negative.  ____________________________________________   PHYSICAL EXAM:  VITAL SIGNS: ED Triage Vitals  Enc Vitals Group     BP 10/12/15 0007 103/79     Pulse Rate 10/12/15 0007 71     Resp 10/12/15 0007 20  Temp 10/12/15 0007 98.2 F (36.8 C)     Temp Source 10/12/15 0007 Oral     SpO2 10/12/15 0007 98 %     Weight 10/12/15 0006 215 lb (97.5 kg)     Height 10/12/15 0006 5\' 11"  (1.803 m)     Head Circumference --      Peak Flow --      Pain Score 10/12/15 0006 8     Pain Loc --      Pain Edu? --      Excl. in GC? --     Constitutional: Alert and oriented. Well appearing and in no acute distress. Eyes: Conjunctivae are normal. PERRL. EOMI. Head: Atraumatic. Nose: No congestion/rhinnorhea. Mouth/Throat: Mucous membranes are  moist.  Neck: No stridor.  Midline tenderness to the cervical spine at C2-C5 without any deformity or step-off.Patient in c-collar and left in place. Cardiovascular: Normal rate, regular rhythm. Grossly normal heart sounds.   Respiratory: Normal respiratory effort.  No retractions. Lungs CTAB. Gastrointestinal: Soft and nontender. No distention. No abdominal bruits. No CVA tenderness. Musculoskeletal: No lower extremity tenderness nor edema.  No joint effusions. Neurologic:  Normal speech and language. No gross focal neurologic deficits are appreciated. 5 out of 5 strength throughout with sensation to light touch intact as well. Skin:  Skin is warm, dry and intact. No rash noted. Psychiatric: Mood and affect are normal. Speech and behavior are normal.  ____________________________________________   LABS (all labs ordered are listed, but only abnormal results are displayed)  Labs Reviewed - No data to display ____________________________________________  EKG   ____________________________________________  RADIOLOGY  CT Cervical Spine Wo Contrast (Accession [id number]) (Order [id number])  Imaging  Date: 10/12/2015 Department: [facility] EMERGENCY DEPARTMENT Released By: Gordy Levan, RN (auto-released) Authorizing: Myrna Blazer, MD  PACS Images   Show images for CT Cervical Spine Wo Contrast  Study Result   CLINICAL DATA:  Injury, patient reports a popping sensation while operating a crane at work tonight, now with pain between shoulders and posterior neck.  EXAM: CT CERVICAL SPINE WITHOUT CONTRAST  TECHNIQUE: Multidetector CT imaging of the cervical spine was performed without intravenous contrast. Multiplanar CT image reconstructions were also generated.  COMPARISON:  None.  FINDINGS: No fracture or acute subluxation. Probable midline disc protrusion/extrusion at C3-C4 causing mass effect on the canal and ventral cord. The  dens is intact. There are no jumped or perched facets. Vertebral body heights and intervertebral disc spaces are preserved. No prevertebral soft tissue edema.  IMPRESSION: 1. No acute fracture or osseous abnormality of the cervical spine. 2. Probable midline disc protrusion/extrusion at C3-C4 causing mass effect on the cervical canal and cord. Recommend cervical spine MRI.   Electronically Signed   By: Rubye Oaks M.D.   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  EXAM: MRI CERVICAL SPINE WITHOUT CONTRAST  TECHNIQUE: Multiplanar, multisequence MR imaging of the cervical spine was performed. No intravenous contrast was administered.  COMPARISON:  Cervical CT dated 10/12/2015.  FINDINGS: Alignment: Physiologic.  Vertebrae: No fracture or bone lesion identified.  Cord: No  abnormal cord signal.  Posterior Fossa, vertebral arteries, paraspinal tissues: Negative.  Disc levels:  C3-4 disc central extrusion measuring 6 x 7 x 16 mm (AP x ML x CC), series 3, image 7 and series 7, image 9. Associated annular fissure. The disc impinges on the anterior cord which is flattened.  Otherwise there is no significant disc displacement, foraminal narrowing, or canal stenosis of the cervical spine.  IMPRESSION: C3-4 central disc extrusion impinging and flattening the cord. No abnormal cord signal at this time. Otherwise no significant degenerative changes of the cervical spine.  These results were called by telephone at the time of interpretation on  10/12/2015 at 4:47 am to Dr. Gladstone PihAVID SCHAEVITZ , who verbally acknowledged these results.   Electronically Signed   By: Mitzi HansenLance  Furusawa-Stratton M.D.   On: 10/12/2015 04:47    ____________________________________________   PROCEDURES  Procedure(s) performed:   Procedures  Critical Care performed:   ____________________________________________   INITIAL IMPRESSION / ASSESSMENT AND PLAN / ED COURSE  Pertinent labs & imaging results that were available during my care of the patient were reviewed by me and considered in my medical decision making (see chart for details).  ----------------------------------------- 5:33 AM on 10/12/2015 -----------------------------------------  Patient continues to rest comfortably. Continues to be neurologically intact. I discussed case with Dr. Bevely Palmeritty of neurosurgery [place] who says to put the patient an aspirin collar and started on a Medrol Dosepak and then DC to home with office follow-up. I explained this plan of the patient was understanding and willing to comply. He will also receive a work note and follow-up with his occupational health or Workmen's Comp. assigned doctor. He understands the plan and is willing to comply. We also reviewed his images in the understands diagnosis.  Clinical Course     ____________________________________________   FINAL CLINICAL IMPRESSION(S) / ED DIAGNOSES  Final diagnoses:  None   Herniated disc in the cervical spine   NEW MEDICATIONS STARTED DURING THIS VISIT:  New Prescriptions   No medications on file     Note:  This document was prepared using Dragon voice recognition software and may include unintentional dictation errors.    Myrna Blazeravid Matthew Schaevitz, MD 10/12/15 [phone]

## 2015-10-12 NOTE — ED Notes (Signed)
Patient transported to MRI 

## 2015-10-20 ENCOUNTER — Encounter: Payer: Self-pay | Admitting: *Deleted

## 2015-10-20 ENCOUNTER — Emergency Department
Admission: EM | Admit: 2015-10-20 | Discharge: 2015-10-20 | Disposition: A | Discharge status: Home / Self Care | Payer: Worker's Compensation | Attending: Emergency Medicine | Admitting: Emergency Medicine

## 2015-10-20 DIAGNOSIS — Z791 Long term (current) use of non-steroidal anti-inflammatories (NSAID): Secondary | ICD-10-CM | POA: Diagnosis not present

## 2015-10-20 DIAGNOSIS — M542 Cervicalgia: Secondary | ICD-10-CM | POA: Diagnosis present

## 2015-10-20 DIAGNOSIS — M5020 Other cervical disc displacement, unspecified cervical region: Secondary | ICD-10-CM | POA: Diagnosis not present

## 2015-10-20 DIAGNOSIS — F17210 Nicotine dependence, cigarettes, uncomplicated: Secondary | ICD-10-CM | POA: Diagnosis not present

## 2015-10-20 DIAGNOSIS — F1290 Cannabis use, unspecified, uncomplicated: Secondary | ICD-10-CM | POA: Insufficient documentation

## 2015-10-20 DIAGNOSIS — Z79899 Other long term (current) drug therapy: Secondary | ICD-10-CM | POA: Insufficient documentation

## 2015-10-20 MED ORDER — METHOCARBAMOL 750 MG PO TABS: 750.0000 mg | ORAL_TABLET | Freq: Four times a day (QID) | ORAL | 0 refills | Status: DC

## 2015-10-20 MED ORDER — OXYCODONE-ACETAMINOPHEN 7.5-325 MG PO TABS: 1.0000 | ORAL_TABLET | ORAL | 0 refills | Status: DC | PRN

## 2015-10-20 NOTE — Discharge Instructions (Signed)
Follow up with neurology as soon as possible.

## 2015-10-20 NOTE — ED Notes (Signed)
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

## 2015-10-20 NOTE — ED Provider Notes (Signed)
[facility] Emergency Department Provider Note   ____________________________________________   None    (approximate)  I have reviewed the triage vital signs and the nursing notes.   HISTORY  Chief Complaint Neck Pain    HPI Benjamin Beltran is a 30 y.o. male patient here for neck pain secondary to herniated disc. Simple work-related incident which occurred on August 21 of this year. Patient had CT and MRI confirming C3-C4 central disc extrusion impinging flattening of the cord. Patient was scheduled follow-up with neurologist but was told past suggested that since he has a lawyer's process of the slowed down for approval. Patient here for pain medication. Patient is currently taking Medrol Dosepak. Patient is very apprehensive about long-term sequela of his injury.   Past Medical History:  Diagnosis Date  . Elevated liver function tests unk    There are no active problems to display for this patient.   Past Surgical History:  Procedure Laterality Date  . tendon surgery to left hand  unk    Prior to Admission medications   Medication Sig Start Date End Date Taking? Authorizing Provider  naproxen (NAPROSYN) 500 MG tablet Take 500 mg by mouth 2 (two) times daily with a meal.   Yes Historical Provider, MD  amoxicillin (AMOXIL) 500 MG tablet Take 1 tablet (500 mg total) by mouth 3 (three) times daily with meals. Patient not taking: Reported on 09/03/2015 07/28/15   Jami L Hagler, PA-C  buPROPion (WELLBUTRIN SR) 150 MG 12 hr tablet Take 1 tablet (150 mg) orally daily for the first three days. Then take 1 tablet (150 mg) orally BID (total of 300 mg daily) daily 09/03/15 10/04/15  Phineas SemenGraydon Goodman, MD  famotidine (PEPCID) 40 MG tablet Take 1 tablet (40 mg total) by mouth every evening. 03/01/15 02/29/16  Rebecka ApleyAllison P Webster, MD  ibuprofen (ADVIL,MOTRIN) 600 MG tablet Take 1 tablet (600 mg total) by mouth every 6 (six) hours as needed. 07/28/15   Jami L Hagler, PA-C    methocarbamol (ROBAXIN-750) 750 MG tablet Take 1 tablet (750 mg total) by mouth 4 (four) times daily. 10/20/15   Joni Reiningonald K Smith, PA-C  methylPREDNISolone (MEDROL) 4 MG TBPK tablet Disp dose pak. Follow directions on packaging 10/12/15   Myrna Blazeravid Matthew Schaevitz, MD  ondansetron (ZOFRAN ODT) 4 MG disintegrating tablet Take 1 tablet (4 mg total) by mouth every 8 (eight) hours as needed for nausea or vomiting. Patient not taking: Reported on 09/03/2015 07/08/15   Jami L Hagler, PA-C  oxyCODONE-acetaminophen (PERCOCET) 7.5-325 MG tablet Take 1 tablet by mouth every 4 (four) hours as needed for severe pain. 10/20/15   Joni Reiningonald K Smith, PA-C  penicillin v potassium (VEETID) 500 MG tablet Take 1 tablet (500 mg total) by mouth 4 (four) times daily. Patient not taking: Reported on 09/03/2015 04/19/15   Charlesetta IvoryJenise V Bacon Menshew, PA-C    Allergies Review of patient's allergies indicates no known allergies.  No family history on file.  Social History Social History  Substance Use Topics  . Smoking status: Current Every Day Smoker    Packs/day: 0.50    Types: Cigarettes  . Smokeless tobacco: Never Used  . Alcohol use Yes    Review of Systems Constitutional: No fever/chills Eyes: No visual changes. ENT: No sore throat. Cardiovascular: Denies chest pain. Respiratory: Denies shortness of breath. Gastrointestinal: No abdominal pain.  No nausea, no vomiting.  No diarrhea.  No constipation. Genitourinary: Negative for dysuria. Musculoskeletal:Positive for neck pain  Skin: Negative for  rash. Neurological: Negative for headaches, focal weakness or numbness.    ____________________________________________   PHYSICAL EXAM:  VITAL SIGNS: ED Triage Vitals  Enc Vitals Group     BP 10/20/15 0754 129/68     Pulse Rate 10/20/15 0754 (!) 57     Resp 10/20/15 0754 16     Temp 10/20/15 0754 98.2 F (36.8 C)     Temp Source 10/20/15 0754 Oral     SpO2 10/20/15 0754 100 %     Weight 10/20/15 0749 215 lb  (97.5 kg)     Height 10/20/15 0749 5\' 11"  (1.803 m)     Head Circumference --      Peak Flow --      Pain Score 10/20/15 0749 8     Pain Loc --      Pain Edu? --      Excl. in GC? --     Constitutional: Alert and oriented. Well appearing and in no acute distress. Eyes: Conjunctivae are normal. PERRL. EOMI. Head: Atraumatic. Nose: No congestion/rhinnorhea. Mouth/Throat: Mucous membranes are moist.  Oropharynx non-erythematous. Neck: No stridor.  Patient is wearing c-collar. Hematological/Lymphatic/Immunilogical: No cervical lymphadenopathy. Cardiovascular: Normal rate, regular rhythm. Grossly normal heart sounds.  Good peripheral circulation. Respiratory: Normal respiratory effort.  No retractions. Lungs CTAB. Gastrointestinal: Soft and nontender. No distention. No abdominal bruits. No CVA tenderness. Musculoskeletal: No lower extremity tenderness nor edema.  No joint effusions. Neurologic:  Normal speech and language. No gross focal neurologic deficits are appreciated. No gait instability. Skin:  Skin is warm, dry and intact. No rash noted. Psychiatric: Mood and affect are normal. Speech and behavior are normal.  ____________________________________________   LABS (all labs ordered are listed, but only abnormal results are displayed)  Labs Reviewed - No data to display ____________________________________________  EKG   ____________________________________________  RADIOLOGY  Review of CT and MRI taken on previous visits confirming C C3-C4 central disc extrusion. ____________________________________________   PROCEDURES  Procedure(s) performed: None  Procedures  Critical Care performed: No  ____________________________________________   INITIAL IMPRESSION / ASSESSMENT AND PLAN / ED COURSE  Pertinent labs & imaging results that were available during my care of the patient were reviewed by me and considered in my medical decision making (see chart for  details).  Herniated disks of cervical spine. Patient given discharge care instructions. Patient advised must attain prioritization through his lawyer from the adjuster to see neurology. Patient given prescription for Robaxin and Percocets.  Clinical Course     ____________________________________________   FINAL CLINICAL IMPRESSION(S) / ED DIAGNOSES  Final diagnoses:  Cervical herniated disc      NEW MEDICATIONS STARTED DURING THIS VISIT:  New Prescriptions   METHOCARBAMOL (ROBAXIN-750) 750 MG TABLET    Take 1 tablet (750 mg total) by mouth 4 (four) times daily.   OXYCODONE-ACETAMINOPHEN (PERCOCET) 7.5-325 MG TABLET    Take 1 tablet by mouth every 4 (four) hours as needed for severe pain.     Note:  This document was prepared using Dragon voice recognition software and may include unintentional dictation errors.    Joni Reining, PA-C 10/20/15 1610    Loleta Rose, MD 10/20/15 [phone]

## 2015-10-20 NOTE — ED Triage Notes (Signed)
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

## 2015-11-17 ENCOUNTER — Emergency Department
Admission: EM | Admit: 2015-11-17 | Discharge: 2015-11-17 | Disposition: A | Discharge status: Home / Self Care | Payer: Worker's Compensation | Attending: Emergency Medicine | Admitting: Emergency Medicine

## 2015-11-17 DIAGNOSIS — R202 Paresthesia of skin: Secondary | ICD-10-CM | POA: Insufficient documentation

## 2015-11-17 DIAGNOSIS — K0510 Chronic gingivitis, plaque induced: Secondary | ICD-10-CM | POA: Diagnosis not present

## 2015-11-17 DIAGNOSIS — K0889 Other specified disorders of teeth and supporting structures: Secondary | ICD-10-CM

## 2015-11-17 DIAGNOSIS — F17210 Nicotine dependence, cigarettes, uncomplicated: Secondary | ICD-10-CM | POA: Diagnosis not present

## 2015-11-17 DIAGNOSIS — Z792 Long term (current) use of antibiotics: Secondary | ICD-10-CM | POA: Diagnosis not present

## 2015-11-17 DIAGNOSIS — M542 Cervicalgia: Secondary | ICD-10-CM | POA: Diagnosis not present

## 2015-11-17 DIAGNOSIS — Z791 Long term (current) use of non-steroidal anti-inflammatories (NSAID): Secondary | ICD-10-CM | POA: Diagnosis not present

## 2015-11-17 DIAGNOSIS — G8929 Other chronic pain: Secondary | ICD-10-CM | POA: Insufficient documentation

## 2015-11-17 NOTE — ED Provider Notes (Signed)
[facility] Emergency Department Provider Note  ____________________________________________  Time seen: Approximately 7:52 AM  I have reviewed the triage vital signs and the nursing notes.   HISTORY  Chief Complaint Neck Injury    HPI Benjamin Beltran is a 30 y.o. male who reports a work-related neck injury on August 21. He was seen in the next day in this emergency department, had a CT and MRI of the spine, case was discussed with neurosurgery and plan was for follow-up. However the patient has not followed up. He reports that he has run out of his pain medicine and requests more pain medicine. No new complaints. Usually he is pain-free, but he states that when he lifts heavy things he sometimes gets some tingling in his fingertips.     Past Medical History:  Diagnosis Date  . Elevated liver function tests unk     There are no active problems to display for this patient.    Past Surgical History:  Procedure Laterality Date  . tendon surgery to left hand  unk     Prior to Admission medications   Medication Sig Start Date End Date Taking? Authorizing Provider  amoxicillin (AMOXIL) 500 MG tablet Take 1 tablet (500 mg total) by mouth 3 (three) times daily with meals. Patient not taking: Reported on 09/03/2015 07/28/15   Jami L Hagler, PA-C  buPROPion (WELLBUTRIN SR) 150 MG 12 hr tablet Take 1 tablet (150 mg) orally daily for the first three days. Then take 1 tablet (150 mg) orally BID (total of 300 mg daily) daily 09/03/15 10/04/15  Phineas Semen, MD  famotidine (PEPCID) 40 MG tablet Take 1 tablet (40 mg total) by mouth every evening. 03/01/15 02/29/16  Rebecka Apley, MD  ibuprofen (ADVIL,MOTRIN) 600 MG tablet Take 1 tablet (600 mg total) by mouth every 6 (six) hours as needed. 07/28/15   Jami L Hagler, PA-C  methocarbamol (ROBAXIN-750) 750 MG tablet Take 1 tablet (750 mg total) by mouth 4 (four) times daily. 10/20/15   Joni Reining, PA-C   methylPREDNISolone (MEDROL) 4 MG TBPK tablet Disp dose pak. Follow directions on packaging 10/12/15   Myrna Blazer, MD  naproxen (NAPROSYN) 500 MG tablet Take 500 mg by mouth 2 (two) times daily with a meal.    Historical Provider, MD  ondansetron (ZOFRAN ODT) 4 MG disintegrating tablet Take 1 tablet (4 mg total) by mouth every 8 (eight) hours as needed for nausea or vomiting. Patient not taking: Reported on 09/03/2015 07/08/15   Jami L Hagler, PA-C  oxyCODONE-acetaminophen (PERCOCET) 7.5-325 MG tablet Take 1 tablet by mouth every 4 (four) hours as needed for severe pain. 10/20/15   Joni Reining, PA-C  penicillin v potassium (VEETID) 500 MG tablet Take 1 tablet (500 mg total) by mouth 4 (four) times daily. Patient not taking: Reported on 09/03/2015 04/19/15   Charlesetta Ivory Menshew, PA-C     Allergies Review of patient's allergies indicates no known allergies.   No family history on file.  Social History Social History  Substance Use Topics  . Smoking status: Current Every Day Smoker    Packs/day: 0.50    Types: Cigarettes  . Smokeless tobacco: Never Used  . Alcohol use Yes    Review of Systems  Constitutional:   No fever or chills.  ENT:   Recurrent dental pain in the upper left molars. Concerned about infection in these teeth. Cardiovascular:   No chest pain. Respiratory:   No dyspnea or cough.  Gastrointestinal:   Negative for abdominal pain, vomiting and diarrhea.   Neurological:   Negative for headaches. No weakness. Occasional paresthesia in the fingertips. 10-point ROS otherwise negative.  ____________________________________________   PHYSICAL EXAM:  VITAL SIGNS: ED Triage Vitals  Enc Vitals Group     BP 11/17/15 0624 120/81     Pulse Rate 11/17/15 0624 82     Resp 11/17/15 0624 18     Temp 11/17/15 0624 98.4 F (36.9 C)     Temp Source 11/17/15 0624 Oral     SpO2 11/17/15 0624 100 %     Weight 11/17/15 0627 215 lb (97.5 kg)     Height 11/17/15  0627 5\' 11"  (1.803 m)     Head Circumference --      Peak Flow --      Pain Score 11/17/15 0628 8     Pain Loc --      Pain Edu? --      Excl. in GC? --     Vital signs reviewed, nursing assessments reviewed.   Constitutional:   Alert and oriented. Well appearing and in no distress. Eyes:   No scleral icterus. No conjunctival pallor. PERRL. EOMI.  No nystagmus. ENT   Head:   Normocephalic and atraumatic.   Nose:   No congestion/rhinnorhea. No septal hematoma   Mouth/Throat:   MMM, no pharyngeal erythema. No peritonsillar mass. Diffuse dental decay. A few missing teeth. There is some gingivitis in the indicated area, but teeth are well seated and nontender, no gingival swelling fluctuance or drainage.   Neck:    No SubQ emphysema. Wearing a c-collar. Nontender. Hematological/Lymphatic/Immunilogical:   No cervical lymphadenopathy. Cardiovascular:   RRR. Symmetric bilateral radial and DP pulses.  No murmurs.  Respiratory:   Normal respiratory effort without tachypnea nor retractions. Breath sounds are clear and equal bilaterally. No wheezes/rales/rhonchi. Gastrointestinal:   Soft and nontender. Non distended. There is no CVA tenderness.  No rebound, rigidity, or guarding. Genitourinary:   deferred Musculoskeletal:   Nontender with normal range of motion in all extremities. No joint effusions.  No lower extremity tenderness.  No edema. Neurologic:   Normal speech and language.  CN 2-10 normal. Motor grossly intact. Good grip strength. Normal coordination. No gross focal neurologic deficits are appreciated.   ____________________________________________    LABS (pertinent positives/negatives) (all labs ordered are listed, but only abnormal results are displayed) Labs Reviewed - No data to display ____________________________________________   EKG    ____________________________________________     RADIOLOGY    ____________________________________________   PROCEDURES Procedures  ____________________________________________   INITIAL IMPRESSION / ASSESSMENT AND PLAN / ED COURSE  Pertinent labs & imaging results that were available during my care of the patient were reviewed by me and considered in my medical decision making (see chart for details).  Patient presents with chronic neck pain requesting a refill of opioid pain medication due to his lack of following through on the follow-up plan with neurosurgery. When I advised him that I would not refill his opioid pain medication today for his injury that happened more than a month ago that he has not followed up for, he then requested that I evaluate his dental pain. On discussing my findings with him and recommending good oral hygiene, he then requested evaluation of his history of elevated liver enzymes, requesting lab draws. I advised him this is not indicated today and reinforced that he needs to follow up with primary care. The patient became agitated and walked  out of the emergency department prior to the conclusion of my encounter with him.  He then later walked back to the triage desk requesting his discharge instructions. They're printed for him, but he had already left before they could be delivered to him.  He does not appear to be having any serious neurologic sequelae at this time and is stable for outpatient follow-up.   Clinical Course   ____________________________________________   FINAL CLINICAL IMPRESSION(S) / ED DIAGNOSES  Final diagnoses:  Chronic pain  Pain, dental       Portions of this note were generated with dragon dictation software. Dictation errors may occur despite best attempts at proofreading.    Sharman Cheek, MD 11/17/15 [phone]

## 2015-11-17 NOTE — ED Notes (Signed)
Pt reporting neck pain from work injury, states intermittent pain that radiates down both arms. Pt also c/o tooth ache, thinks it might be infected; requesting antibiotics.

## 2015-11-17 NOTE — ED Notes (Signed)
Out front to give pt DC papers. Pt is gone. Left up front in case returns. Unable to sign.

## 2015-11-17 NOTE — ED Triage Notes (Addendum)
Pt was injured at work 8/21 and was seen here told he had a bulging disc and told to see neurosurgeon and wear neck collar. Pt still wearing neck collar but has not seen neuro has not been able to make appointment. Pt seen here and urgent cares for the same multiple times.

## 2015-11-17 NOTE — ED Notes (Signed)
Per first nurse Pt at front desk asking about discharge papers. Dr Scotty Courtstafford notified and will get DC papers together so pt can be properly discharged.

## 2015-11-17 NOTE — ED Notes (Signed)
Pt seen walking out of ED. No discharge papers or plan up. Pt was alert and oriented in room.

## 2015-11-17 NOTE — ED Notes (Signed)
MD at bedside. 

## 2015-12-27 ENCOUNTER — Emergency Department
Admission: EM | Admit: 2015-12-27 | Discharge: 2015-12-27 | Disposition: A | Discharge status: Home / Self Care | Payer: Self-pay | Attending: Emergency Medicine | Admitting: Emergency Medicine

## 2015-12-27 ENCOUNTER — Encounter: Payer: Self-pay | Admitting: *Deleted

## 2015-12-27 ENCOUNTER — Emergency Department: Payer: Self-pay

## 2015-12-27 DIAGNOSIS — R1084 Generalized abdominal pain: Secondary | ICD-10-CM | POA: Insufficient documentation

## 2015-12-27 DIAGNOSIS — F17210 Nicotine dependence, cigarettes, uncomplicated: Secondary | ICD-10-CM | POA: Insufficient documentation

## 2015-12-27 LAB — COMPREHENSIVE METABOLIC PANEL
ALBUMIN: 4.3 g/dL (ref 3.5–5.0)
ALT: 37 U/L (ref 17–63)
ANION GAP: 7 (ref 5–15)
AST: 43 U/L — ABNORMAL HIGH (ref 15–41)
Alkaline Phosphatase: 52 U/L (ref 38–126)
BUN: 11 mg/dL (ref 6–20)
CHLORIDE: 103 mmol/L (ref 101–111)
CO2: 29 mmol/L (ref 22–32)
Calcium: 9 mg/dL (ref 8.9–10.3)
Creatinine, Ser: 1.08 mg/dL (ref 0.61–1.24)
GFR calc non Af Amer: >60 mL/min (ref >60)
GLUCOSE: 102 mg/dL — AB (ref 65–99)
Potassium: 4 mmol/L (ref 3.5–5.1)
SODIUM: 139 mmol/L (ref 135–145)
Total Bilirubin: 0.3 mg/dL (ref 0.3–1.2)
Total Protein: 7.2 g/dL (ref 6.5–8.1)

## 2015-12-27 LAB — CBC
HCT: 41 % (ref 40.0–52.0)
HEMOGLOBIN: 13.8 g/dL (ref 13.0–18.0)
MCH: 28.8 pg (ref 26.0–34.0)
MCHC: 33.8 g/dL (ref 32.0–36.0)
MCV: 85.2 fL (ref 80.0–100.0)
PLATELETS: 192 10*3/uL (ref 150–440)
RBC: 4.81 MIL/uL (ref 4.40–5.90)
RDW: 13.8 % (ref 11.5–14.5)
WBC: 4.4 10*3/uL (ref 3.8–10.6)

## 2015-12-27 LAB — LIPASE, BLOOD: LIPASE: 35 U/L (ref 11–51)

## 2015-12-27 LAB — TROPONIN I: Troponin I: <0.03 ng/mL (ref <0.03)

## 2015-12-27 NOTE — ED Notes (Signed)
Pt discharged home after verbalizing understanding of discharge instructions; nad noted. 

## 2015-12-27 NOTE — ED Triage Notes (Signed)
Pt complains of diffuse abdominal pain for 1 month,pt is scheduled for surgery tomorrow at [facility] for a herniated disc in his neck, pt wants to get his stomach checked before having surgery

## 2015-12-27 NOTE — ED Provider Notes (Signed)
[facility] Emergency Department Provider Note   ____________________________________________    I have reviewed the triage vital signs and the nursing notes.   HISTORY  Chief Complaint Abdominal Pain     HPI Benjamin Beltran is a 30 y.o. male who presents with complaints of abdominal pain. Patient reports this pain is been occurring over the last month. He notes he has intermittent episodes of abdominal pain which is typically sharp and varies in location, he feels it is typically in the right upper quadrant however. He also reports occasional episodes of left upper chest discomfort. None today. No fevers or chills. He is getting neck surgery tomorrow so wants to be "checked out" before the procedure.   Past Medical History:  Diagnosis Date  . Elevated liver function tests unk    There are no active problems to display for this patient.   Past Surgical History:  Procedure Laterality Date  . tendon surgery to left hand  unk    Prior to Admission medications   Not on File     Allergies Patient has no known allergies.  No family history on file.  Social History Social History  Substance Use Topics  . Smoking status: Current Every Day Smoker    Packs/day: 0.50    Types: Cigarettes  . Smokeless tobacco: Never Used  . Alcohol use Yes    Review of Systems  Constitutional: No fever/chills   Cardiovascular: As above  Gastrointestinal: As above.  No nausea, no vomiting.    Musculoskeletal: Negative for back pain. Skin: Negative for rash. Neurological: Negative for headaches or weakness  10-point ROS otherwise negative.  ____________________________________________   PHYSICAL EXAM:  VITAL SIGNS: ED Triage Vitals [12/27/15 0756]  Enc Vitals Group     BP 128/89     Pulse Rate (!) 56     Resp 20     Temp 97.8 F (36.6 C)     Temp Source Oral     SpO2 100 %     Weight 220 lb (99.8 kg)     Height 5\' 11"  (1.803 m)   Head Circumference      Peak Flow      Pain Score 8     Pain Loc      Pain Edu?      Excl. in GC?     Constitutional: Alert and oriented. No acute distress. Pleasant and interactive Eyes: Conjunctivae are normal.   Nose: No congestion/rhinnorhea. Mouth/Throat: Mucous membranes are moist.   Cardiovascular: Normal rate, regular rhythm. Grossly normal heart sounds.  Good peripheral circulation. Respiratory: Normal respiratory effort.  No retractions. Lungs CTAB. Gastrointestinal: Soft and nontender. No distention.  No CVA tenderness. Genitourinary: deferred Musculoskeletal: No lower extremity tenderness nor edema.  Warm and well perfused Neurologic:  Normal speech and language. No gross focal neurologic deficits are appreciated.  Skin:  Skin is warm, dry and intact. No rash noted. Psychiatric: Mood and affect are normal. Speech and behavior are normal.  ____________________________________________   LABS (all labs ordered are listed, but only abnormal results are displayed)  Labs Reviewed  COMPREHENSIVE METABOLIC PANEL - Abnormal; Notable for the following:       Result Value   Glucose, Bld 102 (*)    AST 43 (*)    All other components within normal limits  CBC  TROPONIN I  LIPASE, BLOOD   ____________________________________________  EKG  ED ECG REPORT I, Jene EveryKINNER, Sharell Hilmer, the attending physician, personally viewed and interpreted  this ECG.  Date: 12/27/2015 EKG Time: 9:24 AM Rate: 56 Rhythm: normal sinus rhythm QRS Axis: normal Intervals: normal ST/T Wave abnormalities: Nonspecific changes Conduction Disturbances: none Narrative Interpretation: unremarkable  ____________________________________________  RADIOLOGY  None ____________________________________________   PROCEDURES  Procedure(s) performed: No    Critical Care performed: No ____________________________________________   INITIAL IMPRESSION / ASSESSMENT AND PLAN / ED COURSE  Pertinent  labs & imaging results that were available during my care of the patient were reviewed by me and considered in my medical decision making (see chart for details).  Patient well-appearing and in no distress. His abdominal exam is benign. We will check labs and if unremarkable we'll discharged for outpatient follow-up.  Clinical Course   Labwork unremarkable, patient remains well-appearing and in no distress. Outpatient follow-up. Return precautions discussed. ____________________________________________   FINAL CLINICAL IMPRESSION(S) / ED DIAGNOSES  Final diagnoses:  Generalized abdominal pain      NEW MEDICATIONS STARTED DURING THIS VISIT:  New Prescriptions   No medications on file     Note:  This document was prepared using Dragon voice recognition software and may include unintentional dictation errors.    Jene Everyobert Andee Chivers, MD 12/27/15 [phone]

## 2015-12-27 NOTE — ED Notes (Addendum)
Pt c/o pain in abdomen that is intermittent in nature and has been occurring for over a month. Pt states it feels like sharp and/or burning and lasts about a minute. States it happens several times a day, every day. PT reports that he is having surgery tomorrow and wanted to have this checked out before then. Pt states he does not have a PCP. Encouraged to find a PCP so he can have adequate follow up for medical issues. 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

## 2016-03-23 ENCOUNTER — Emergency Department
Admission: EM | Admit: 2016-03-23 | Discharge: 2016-03-23 | Disposition: A | Discharge status: Home / Self Care | Payer: Worker's Compensation | Attending: Emergency Medicine | Admitting: Emergency Medicine

## 2016-03-23 ENCOUNTER — Emergency Department: Payer: PRIVATE HEALTH INSURANCE

## 2016-03-23 ENCOUNTER — Encounter: Payer: Self-pay | Admitting: Emergency Medicine

## 2016-03-23 DIAGNOSIS — F17210 Nicotine dependence, cigarettes, uncomplicated: Secondary | ICD-10-CM | POA: Insufficient documentation

## 2016-03-23 DIAGNOSIS — R1010 Upper abdominal pain, unspecified: Secondary | ICD-10-CM

## 2016-03-23 DIAGNOSIS — M6282 Rhabdomyolysis: Secondary | ICD-10-CM

## 2016-03-23 LAB — URINALYSIS, COMPLETE (UACMP) WITH MICROSCOPIC
Bacteria, UA: NONE SEEN
Bilirubin Urine: NEGATIVE
GLUCOSE, UA: NEGATIVE mg/dL
HGB URINE DIPSTICK: NEGATIVE
Ketones, ur: NEGATIVE mg/dL
LEUKOCYTES UA: NEGATIVE
NITRITE: NEGATIVE
PH: 6 (ref 5.0–8.0)
Protein, ur: NEGATIVE mg/dL
RBC / HPF: NONE SEEN RBC/hpf (ref 0–5)
SPECIFIC GRAVITY, URINE: 1.015 (ref 1.005–1.030)
SQUAMOUS EPITHELIAL / LPF: NONE SEEN

## 2016-03-23 LAB — CK
CK TOTAL: 2710 U/L — AB (ref 49–397)
Total CK: 2562 U/L — ABNORMAL HIGH (ref 49–397)

## 2016-03-23 LAB — COMPREHENSIVE METABOLIC PANEL
ALT: 36 U/L (ref 17–63)
AST: 58 U/L — AB (ref 15–41)
Albumin: 4.1 g/dL (ref 3.5–5.0)
Alkaline Phosphatase: 51 U/L (ref 38–126)
Anion gap: 5 (ref 5–15)
BILIRUBIN TOTAL: 0.5 mg/dL (ref 0.3–1.2)
BUN: 12 mg/dL (ref 6–20)
CALCIUM: 8.8 mg/dL — AB (ref 8.9–10.3)
CO2: 30 mmol/L (ref 22–32)
CREATININE: 1.14 mg/dL (ref 0.61–1.24)
Chloride: 103 mmol/L (ref 101–111)
Glucose, Bld: 121 mg/dL — ABNORMAL HIGH (ref 65–99)
Potassium: 3.6 mmol/L (ref 3.5–5.1)
Sodium: 138 mmol/L (ref 135–145)
TOTAL PROTEIN: 7.1 g/dL (ref 6.5–8.1)

## 2016-03-23 LAB — CBC
HCT: 40.5 % (ref 40.0–52.0)
Hemoglobin: 13.7 g/dL (ref 13.0–18.0)
MCH: 28.7 pg (ref 26.0–34.0)
MCHC: 33.8 g/dL (ref 32.0–36.0)
MCV: 85 fL (ref 80.0–100.0)
PLATELETS: 181 10*3/uL (ref 150–440)
RBC: 4.76 MIL/uL (ref 4.40–5.90)
RDW: 13.5 % (ref 11.5–14.5)
WBC: 5.9 10*3/uL (ref 3.8–10.6)

## 2016-03-23 LAB — LIPASE, BLOOD: Lipase: 32 U/L (ref 11–51)

## 2016-03-23 LAB — TROPONIN I: Troponin I: <0.03 ng/mL (ref <0.03)

## 2016-03-23 MED ORDER — FAMOTIDINE IN NACL 20-0.9 MG/50ML-% IV SOLN
20.0000 mg | Freq: Once | INTRAVENOUS | Status: AC
  Administered 2016-03-23: 20 mg via INTRAVENOUS
  Filled 2016-03-23: qty 50

## 2016-03-23 MED ORDER — SODIUM CHLORIDE 0.9 % IV BOLUS (SEPSIS)
1000.0000 mL | Freq: Once | INTRAVENOUS | Status: AC
  Administered 2016-03-23: 1000 mL via INTRAVENOUS

## 2016-03-23 MED ORDER — DIAZEPAM 2 MG PO TABS: 2.0000 mg | ORAL_TABLET | Freq: Three times a day (TID) | ORAL | 0 refills | Status: DC | PRN

## 2016-03-23 MED ORDER — LORAZEPAM 2 MG/ML IJ SOLN
1.0000 mg | Freq: Once | INTRAMUSCULAR | Status: AC
  Administered 2016-03-23: 1 mg via INTRAVENOUS
  Filled 2016-03-23: qty 1

## 2016-03-23 NOTE — ED Notes (Signed)
Patient transported to Ultrasound at this time. 

## 2016-03-23 NOTE — ED Provider Notes (Signed)
CK is trending down, patient had been planned on being discharge. I will encourage increase fluid intake and follow up with primary care.   Benjamin FilbertJonathan E Williams, MD 03/23/16 [phone]

## 2016-03-23 NOTE — Discharge Instructions (Signed)
1. Stop taking your supplements. Do not work out for 1 week. 2. Drink plenty of fluids daily. 3. You may take Valium as needed for muscle cramping. 4. Return to the ER for worsening symptoms, persistent vomiting, difficulty breathing or other concerns.

## 2016-03-23 NOTE — ED Provider Notes (Signed)
[facility] Emergency Department Provider Note   ____________________________________________   First MD Initiated Contact with Patient 03/23/16 [phone]     (approximate)  I have reviewed the triage vital signs and the nursing notes.   HISTORY  Chief Complaint Abdominal Pain    HPI Benjamin Beltran is a 31 y.o. male who presents to the ED from home the chief complaint of abdominal pain. Patient reports issues with upper abdominal pain for the past year, increased this week. Describes aching and sometimes burning type pain to his epigastrium and right upper quadrant which wraps around to his back. Symptoms are sometimes exacerbated by food. Admits to prior alcohol dependency and is concerned about his liver. States he was on Robaxin until 3 days ago for a pulled muscle in his upper back/neck. He self discontinued Robaxin and started working out again, taking a new nutritional supplement. Denies recent fever, chills, chest pain, shortness of breath, nausea, vomiting, diarrhea. Denies recent travel or trauma.   Past Medical History:  Diagnosis Date  . Elevated liver function tests unk    There are no active problems to display for this patient.   Past Surgical History:  Procedure Laterality Date  . tendon surgery to left hand  unk    Prior to Admission medications   Medication Sig Start Date End Date Taking? Authorizing Provider  diazepam (VALIUM) 2 MG tablet Take 1 tablet (2 mg total) by mouth every 8 (eight) hours as needed for muscle spasms. 03/23/16   Irean HongJade J Sung, MD    Allergies Patient has no known allergies.  No family history on file.  Social History Social History  Substance Use Topics  . Smoking status: Current Every Day Smoker    Packs/day: 0.50    Types: Cigarettes  . Smokeless tobacco: Never Used  . Alcohol use Yes    Review of Systems  Constitutional: No fever/chills. Eyes: No visual changes. ENT: No sore  throat. Cardiovascular: Denies chest pain. Respiratory: Denies shortness of breath. Gastrointestinal: Positive for abdominal pain.  No nausea, no vomiting.  No diarrhea.  No constipation. Genitourinary: Negative for dysuria. Musculoskeletal: Negative for back pain. Skin: Negative for rash. Neurological: Negative for headaches, focal weakness or numbness.  10-point ROS otherwise negative.  ____________________________________________   PHYSICAL EXAM:  VITAL SIGNS: ED Triage Vitals [03/23/16 0011]  Enc Vitals Group     BP 138/71     Pulse Rate 69     Resp 18     Temp 97.5 F (36.4 C)     Temp Source Oral     SpO2 100 %     Weight 218 lb (98.9 kg)     Height 5\' 11"  (1.803 m)     Head Circumference      Peak Flow      Pain Score 6     Pain Loc      Pain Edu?      Excl. in GC?     Constitutional: Alert and oriented. Well appearing and in no acute distress. Eyes: Conjunctivae are normal. PERRL. EOMI. Head: Atraumatic. Nose: No congestion/rhinnorhea. Mouth/Throat: Mucous membranes are moist.  Oropharynx non-erythematous. Neck: No stridor.   Cardiovascular: Normal rate, regular rhythm. Grossly normal heart sounds.  Good peripheral circulation. Respiratory: Normal respiratory effort.  No retractions. Lungs CTAB. Gastrointestinal: Soft and minimally tender to palpation epigastrium and right upper quadrant without rebound or guarding.. No distention. No abdominal bruits. No CVA tenderness. Musculoskeletal: No lower extremity tenderness nor edema.  No joint effusions. Neurologic:  Normal speech and language. No gross focal neurologic deficits are appreciated. No gait instability. Skin:  Skin is warm, dry and intact. No rash noted. Psychiatric: Mood and affect are normal. Speech and behavior are normal.  ____________________________________________   LABS (all labs ordered are listed, but only abnormal results are displayed)  Labs Reviewed  COMPREHENSIVE METABOLIC PANEL -  Abnormal; Notable for the following:       Result Value   Glucose, Bld 121 (*)    Calcium 8.8 (*)    AST 58 (*)    All other components within normal limits  URINALYSIS, COMPLETE (UACMP) WITH MICROSCOPIC - Abnormal; Notable for the following:    Color, Urine YELLOW (*)    APPearance CLEAR (*)    All other components within normal limits  CK - Abnormal; Notable for the following:    Total CK 2,710 (*)    All other components within normal limits  LIPASE, BLOOD  CBC  TROPONIN I  CK   ____________________________________________  EKG  ED ECG REPORT I, SUNG,JADE J, the attending physician, personally viewed and interpreted this ECG.   Date: 03/23/2016  EKG Time: 0019  Rate: 53  Rhythm: sinus bradycardia  Axis: normal  Intervals:none  ST&T Change: nonspecific  ____________________________________________  RADIOLOGY  Ultrasound interpreted per Dr. Manus Gunning: 1. Contracted gallbladder without gallstones or pericholecystic  inflammation. This may be secondary to p.o. ingestion, chronic  gallbladder dysfunction could have a similar appearance.  2. No biliary dilatation. Normal sonographic appearance of the  liver.   ____________________________________________   PROCEDURES  Procedure(s) performed: None  Procedures  Critical Care performed: No  ____________________________________________   INITIAL IMPRESSION / ASSESSMENT AND PLAN / ED COURSE  Pertinent labs & imaging results that were available during my care of the patient were reviewed by me and considered in my medical decision making (see chart for details).  31 year old male who presents with upper abdominal pain for some time, increased this week. Laboratory results are unremarkable. Will administer IV Pepcid and proceed to right upper quadrant abdominal ultrasound.  Clinical Course as of Mar 23 714  Thu Mar 23, 2016  0509 Updated patient of CK results. Will infuse 2 L IV fluids and recheck CK.  [JS]   0709 Patient resting; repeat CK pending after 2 L IV fluids. Anticipate patient will be discharged home if CK is trending down. Will discharge home on low-dose Valium for muscle cramps. Care transferred to Dr.Williams pending repeat CK results.  [JS]    Clinical Course User Index [JS] Irean Hong, MD     ____________________________________________   FINAL CLINICAL IMPRESSION(S) / ED DIAGNOSES  Final diagnoses:  Pain of upper abdomen  Non-traumatic rhabdomyolysis      NEW MEDICATIONS STARTED DURING THIS VISIT:  New Prescriptions   DIAZEPAM (VALIUM) 2 MG TABLET    Take 1 tablet (2 mg total) by mouth every 8 (eight) hours as needed for muscle spasms.     Note:  This document was prepared using Dragon voice recognition software and may include unintentional dictation errors.    Irean Hong, MD 03/23/16 [phone]

## 2016-03-27 ENCOUNTER — Encounter: Payer: Self-pay | Admitting: Emergency Medicine

## 2016-03-27 ENCOUNTER — Emergency Department: Payer: Self-pay

## 2016-03-27 ENCOUNTER — Emergency Department
Admission: EM | Admit: 2016-03-27 | Discharge: 2016-03-27 | Disposition: A | Discharge status: Home / Self Care | Payer: Self-pay | Attending: Emergency Medicine | Admitting: Emergency Medicine

## 2016-03-27 DIAGNOSIS — R1010 Upper abdominal pain, unspecified: Secondary | ICD-10-CM

## 2016-03-27 DIAGNOSIS — K219 Gastro-esophageal reflux disease without esophagitis: Secondary | ICD-10-CM | POA: Insufficient documentation

## 2016-03-27 DIAGNOSIS — F17210 Nicotine dependence, cigarettes, uncomplicated: Secondary | ICD-10-CM | POA: Insufficient documentation

## 2016-03-27 LAB — COMPREHENSIVE METABOLIC PANEL
ALBUMIN: 4.4 g/dL (ref 3.5–5.0)
ALK PHOS: 50 U/L (ref 38–126)
ALT: 31 U/L (ref 17–63)
AST: 38 U/L (ref 15–41)
Anion gap: 5 (ref 5–15)
BILIRUBIN TOTAL: 0.5 mg/dL (ref 0.3–1.2)
BUN: 10 mg/dL (ref 6–20)
CALCIUM: 8.9 mg/dL (ref 8.9–10.3)
CO2: 29 mmol/L (ref 22–32)
Chloride: 105 mmol/L (ref 101–111)
Creatinine, Ser: 1.1 mg/dL (ref 0.61–1.24)
GFR calc Af Amer: >60 mL/min (ref >60)
GFR calc non Af Amer: >60 mL/min (ref >60)
GLUCOSE: 124 mg/dL — AB (ref 65–99)
POTASSIUM: 3.8 mmol/L (ref 3.5–5.1)
Sodium: 139 mmol/L (ref 135–145)
TOTAL PROTEIN: 7.5 g/dL (ref 6.5–8.1)

## 2016-03-27 LAB — URINALYSIS, COMPLETE (UACMP) WITH MICROSCOPIC
Bacteria, UA: NONE SEEN
Bilirubin Urine: NEGATIVE
GLUCOSE, UA: NEGATIVE mg/dL
Hgb urine dipstick: NEGATIVE
KETONES UR: NEGATIVE mg/dL
Leukocytes, UA: NEGATIVE
Nitrite: NEGATIVE
PH: 7 (ref 5.0–8.0)
Protein, ur: NEGATIVE mg/dL
RBC / HPF: NONE SEEN RBC/hpf (ref 0–5)
SQUAMOUS EPITHELIAL / LPF: NONE SEEN
Specific Gravity, Urine: 1.009 (ref 1.005–1.030)
WBC, UA: NONE SEEN WBC/hpf (ref 0–5)

## 2016-03-27 LAB — CBC WITH DIFFERENTIAL/PLATELET
BASOS ABS: 0 10*3/uL (ref 0–0.1)
BASOS PCT: 1 %
Eosinophils Absolute: 0.2 10*3/uL (ref 0–0.7)
Eosinophils Relative: 5 %
HEMATOCRIT: 42.2 % (ref 40.0–52.0)
HEMOGLOBIN: 14.3 g/dL (ref 13.0–18.0)
LYMPHS PCT: 43 %
Lymphs Abs: 1.5 10*3/uL (ref 1.0–3.6)
MCH: 28.9 pg (ref 26.0–34.0)
MCHC: 33.9 g/dL (ref 32.0–36.0)
MCV: 85.4 fL (ref 80.0–100.0)
MONO ABS: 0.3 10*3/uL (ref 0.2–1.0)
Monocytes Relative: 9 %
NEUTROS ABS: 1.5 10*3/uL (ref 1.4–6.5)
NEUTROS PCT: 42 %
Platelets: 188 10*3/uL (ref 150–440)
RBC: 4.94 MIL/uL (ref 4.40–5.90)
RDW: 13.4 % (ref 11.5–14.5)
WBC: 3.5 10*3/uL — ABNORMAL LOW (ref 3.8–10.6)

## 2016-03-27 LAB — CK: Total CK: 902 U/L — ABNORMAL HIGH (ref 49–397)

## 2016-03-27 MED ORDER — IOPAMIDOL (ISOVUE-300) INJECTION 61%
30.0000 mL | Freq: Once | INTRAVENOUS | Status: AC | PRN
  Administered 2016-03-27: 30 mL via ORAL

## 2016-03-27 MED ORDER — FAMOTIDINE 20 MG PO TABS: 20.0000 mg | ORAL_TABLET | Freq: Two times a day (BID) | ORAL | 1 refills | Status: DC

## 2016-03-27 MED ORDER — SODIUM CHLORIDE 0.9 % IV SOLN
Freq: Once | INTRAVENOUS | Status: AC
  Administered 2016-03-27: 09:00:00 via INTRAVENOUS

## 2016-03-27 MED ORDER — IOPAMIDOL (ISOVUE-300) INJECTION 61%
100.0000 mL | Freq: Once | INTRAVENOUS | Status: AC | PRN
  Administered 2016-03-27: 100 mL via INTRAVENOUS

## 2016-03-27 NOTE — ED Provider Notes (Signed)
[facility] Emergency Department Provider Note        Time seen: ----------------------------------------- 8:29 AM on 03/27/2016 -----------------------------------------    I have reviewed the triage vital signs and the nursing notes.   HISTORY  Chief Complaint Abdominal Pain    HPI Benjamin Beltran is a 31 y.o. male who presents to ER with abdominal pain radiating around to his back. Patient was seen here recently for rhabdo. Patient's main complaint today is abdominal pain that radiates around both flanks and goes into his back. He states nothing makes it better or worse. He is been dealing with this pain for the last 6 months, has been on an acids and states he supposed to take Pepcid but he has not been taking it. He did take a Tums which seemed to help his pain some.   Past Medical History:  Diagnosis Date  . Elevated liver function tests unk    There are no active problems to display for this patient.   Past Surgical History:  Procedure Laterality Date  . tendon surgery to left hand  unk    Allergies Patient has no known allergies.  Social History Social History  Substance Use Topics  . Smoking status: Current Every Day Smoker    Packs/day: 0.50    Types: Cigarettes  . Smokeless tobacco: Never Used  . Alcohol use Yes    Review of Systems Constitutional: Negative for fever. Cardiovascular: Negative for chest pain. Respiratory: Negative for shortness of breath. Gastrointestinal: Positive for abdominal pain Genitourinary: Negative for dysuria. Musculoskeletal: Positive for back pain Skin: Negative for rash. Neurological: Negative for headaches, focal weakness or numbness.  10-point ROS otherwise negative.  ____________________________________________   PHYSICAL EXAM:  VITAL SIGNS: ED Triage Vitals  Enc Vitals Group     BP 03/27/16 0820 138/75     Pulse Rate 03/27/16 0820 72     Resp 03/27/16 0820 18     Temp 03/27/16  0820 98.1 F (36.7 C)     Temp Source 03/27/16 0820 Oral     SpO2 03/27/16 0820 100 %     Weight 03/27/16 0821 218 lb (98.9 kg)     Height 03/27/16 0821 5\' 11"  (1.803 m)     Head Circumference --      Peak Flow --      Pain Score 03/27/16 0821 7     Pain Loc --      Pain Edu? --      Excl. in GC? --     Constitutional: Alert and oriented. Well appearing and in no distress. Eyes: Conjunctivae are normal. PERRL. Normal extraocular movements. ENT   Head: Normocephalic and atraumatic.   Nose: No congestion/rhinnorhea.   Mouth/Throat: Mucous membranes are moist.   Neck: No stridor. Cardiovascular: Normal rate, regular rhythm. No murmurs, rubs, or gallops. Respiratory: Normal respiratory effort without tachypnea nor retractions. Breath sounds are clear and equal bilaterally. No wheezes/rales/rhonchi. Gastrointestinal: Soft and nontender. Normal bowel sounds Musculoskeletal: Nontender with normal range of motion in all extremities. No lower extremity tenderness nor edema. Neurologic:  Normal speech and language. No gross focal neurologic deficits are appreciated.  Skin:  Skin is warm, dry and intact. No rash noted. Psychiatric: Mood and affect are normal. Speech and behavior are normal.   ____________________________________________  ED COURSE:  Pertinent labs & imaging results that were available during my care of the patient were reviewed by me and considered in my medical decision making (see chart for details). Patient's  no distress, I will assess with labs and imaging. We will recheck recent elevated CK levels.   Procedures ____________________________________________   LABS (pertinent positives/negatives)  Labs Reviewed  CBC WITH DIFFERENTIAL/PLATELET - Abnormal; Notable for the following:       Result Value   WBC 3.5 (*)    All other components within normal limits  COMPREHENSIVE METABOLIC PANEL - Abnormal; Notable for the following:    Glucose, Bld 124 (*)     All other components within normal limits  URINALYSIS, COMPLETE (UACMP) WITH MICROSCOPIC - Abnormal; Notable for the following:    Color, Urine STRAW (*)    APPearance CLEAR (*)    All other components within normal limits  CK - Abnormal; Notable for the following:    Total CK 902 (*)    All other components within normal limits    RADIOLOGY CT of the abdomen and pelvis with contrast  IMPRESSION: No acute abnormality identified in the abdomen or pelvis.  ____________________________________________  FINAL ASSESSMENT AND PLAN  Abdominal pain, GERD  Plan: Patient with labs and imaging as dictated above. Patient's CK levels continue to come down from recently. He was given an additional liter of fluid here. CT scan is unremarkable. His symptoms are consistent with GERD. I will advise PPI therapy as directed. He is stable for outpatient follow-up.   Emily Filbert, MD   Note: This note was generated in part or whole with voice recognition software. Voice recognition is usually quite accurate but there are transcription errors that can and very often do occur. I apologize for any typographical errors that were not detected and corrected.     Emily Filbert, MD 03/27/16 1055

## 2016-03-27 NOTE — ED Notes (Signed)
Pt started drinking po contrast

## 2016-03-27 NOTE — ED Notes (Signed)
Pt found vaping in his room, instructed pt that is not allowed on hospital premisses..Marland Kitchen

## 2016-03-27 NOTE — ED Triage Notes (Signed)
Pt presents with abd pain and  Muscular pain radiating around into back. Pt states he was here last week for same and his CK was high. Pt states is not any better.

## 2016-03-27 NOTE — ED Notes (Signed)
Pt finished PO contrast, CT notified.

## 2016-04-22 ENCOUNTER — Emergency Department
Admission: EM | Admit: 2016-04-22 | Discharge: 2016-04-22 | Disposition: A | Discharge status: Home / Self Care | Payer: PRIVATE HEALTH INSURANCE | Attending: Emergency Medicine | Admitting: Emergency Medicine

## 2016-04-22 ENCOUNTER — Emergency Department: Payer: PRIVATE HEALTH INSURANCE

## 2016-04-22 DIAGNOSIS — F17210 Nicotine dependence, cigarettes, uncomplicated: Secondary | ICD-10-CM | POA: Insufficient documentation

## 2016-04-22 DIAGNOSIS — Z79899 Other long term (current) drug therapy: Secondary | ICD-10-CM | POA: Insufficient documentation

## 2016-04-22 DIAGNOSIS — G8929 Other chronic pain: Secondary | ICD-10-CM | POA: Insufficient documentation

## 2016-04-22 DIAGNOSIS — M5441 Lumbago with sciatica, right side: Secondary | ICD-10-CM | POA: Insufficient documentation

## 2016-04-22 DIAGNOSIS — F1290 Cannabis use, unspecified, uncomplicated: Secondary | ICD-10-CM | POA: Insufficient documentation

## 2016-04-22 DIAGNOSIS — M5442 Lumbago with sciatica, left side: Secondary | ICD-10-CM | POA: Insufficient documentation

## 2016-04-22 MED ORDER — HYDROCODONE-ACETAMINOPHEN 5-325 MG PO TABS: 1.0000 | ORAL_TABLET | Freq: Four times a day (QID) | ORAL | 0 refills | Status: DC | PRN

## 2016-04-22 MED ORDER — HYDROCODONE-ACETAMINOPHEN 5-325 MG PO TABS
ORAL_TABLET | ORAL | Status: AC
  Administered 2016-04-22: 1 via ORAL
  Filled 2016-04-22: qty 1

## 2016-04-22 MED ORDER — HYDROCODONE-ACETAMINOPHEN 5-325 MG PO TABS
1.0000 | ORAL_TABLET | ORAL | Status: AC
  Administered 2016-04-22: 1 via ORAL

## 2016-04-22 NOTE — ED Notes (Signed)
Patient reports his CK levels were elevated last month, and is requesting to speak with provider about possible causation.

## 2016-04-22 NOTE — ED Provider Notes (Signed)
[facility]-EMERGENCY DEPARTMENT Provider Note   CSN: [id number] Arrival date & time: 04/22/16  2032     History   Chief Complaint Chief Complaint  Patient presents with  . Back Pain    HPI Benjamin Beltran is a 31 y.o. male resents to the emergency department for evaluation of lower back pain. Back pain isn't present since a Worker's Comp. injury in August 2017. Patient ended up having cervical spine surgery, ACDF. Patient's back pain is been more noticeable since improvement of the cervical spine pain. His pain is 8 out of 10, comes and goes in the middle of his lower lumbar spine with radiation of pain into the posterior aspect of bilateral thighs. He has pain numbness and tingling that comes and goes with activity. His pain is been moderate over the last 2-3 days. He denies any loss of bowel or bladder symptoms. He ambulates and assisted devices. Patient has not been working since his initial injury currently taking Tylenol for pain. Minimal improvement.  HPI  Past Medical History:  Diagnosis Date  . Elevated liver function tests unk    There are no active problems to display for this patient.   Past Surgical History:  Procedure Laterality Date  . tendon surgery to left hand  unk       Home Medications    Prior to Admission medications   Medication Sig Start Date End Date Taking? Authorizing Provider  cyclobenzaprine (FLEXERIL) 5 MG tablet Take 5 mg by mouth 3 (three) times daily as needed for muscle spasms.    Historical Provider, MD  diazepam (VALIUM) 2 MG tablet Take 1 tablet (2 mg total) by mouth every 8 (eight) hours as needed for muscle spasms. Patient not taking: Reported on 03/27/2016 03/23/16   Irean Hong, MD  famotidine (PEPCID) 20 MG tablet Take 1 tablet (20 mg total) by mouth 2 (two) times daily. 03/27/16   Emily Filbert, MD  HYDROcodone-acetaminophen (NORCO) 5-325 MG tablet Take 1 tablet by mouth every 6 (six) hours as needed for moderate pain. 04/22/16   Evon Slack, PA-C  methocarbamol (ROBAXIN) 500 MG tablet Take 500 mg by mouth every 6 (six) hours as needed for muscle spasms.    Historical Provider, MD    Family History No family history on file.  Social History Social History  Substance Use Topics  . Smoking status: Current Every Day Smoker    Packs/day: 0.50    Types: Cigarettes  . Smokeless tobacco: Never Used  . Alcohol use Yes     Allergies   Patient has no known allergies.   Review of Systems Review of Systems  Constitutional: Negative for chills and fever.  HENT: Negative for ear pain and sore throat.   Eyes: Negative for pain and visual disturbance.  Respiratory: Negative for cough and shortness of breath.   Cardiovascular: Negative for chest pain and palpitations.  Gastrointestinal: Negative for abdominal pain and vomiting.  Genitourinary: Negative for dysuria and hematuria.  Musculoskeletal: Positive for back pain. Negative for arthralgias.  Skin: Negative for color change and rash.  Neurological: Positive for numbness. Negative for seizures and syncope.  All other systems reviewed and are negative.    Physical Exam Updated Vital Signs BP (!) 128/59 (BP Location: Right Arm)   Pulse 60   Temp 98 F (36.7 C) (Oral)   Resp 16   Ht 5\' 11"  (1.803 m)   Wt 98.9 kg   SpO2 97%   BMI 30.40 kg/m  Physical Exam  Constitutional: He appears well-developed and well-nourished.  HENT:  Head: Normocephalic and atraumatic.  Right Ear: External ear normal.  Left Ear: External ear normal.  Eyes: Conjunctivae are normal.  Neck: Neck supple.  Cardiovascular: Normal rate and regular rhythm.   No murmur heard. Pulmonary/Chest: Effort normal and breath sounds normal. No respiratory distress.  Abdominal: Soft. He exhibits no mass. There is no tenderness. There is no rebound and no guarding.  Musculoskeletal:  Lumbar Spine: Examination of the lumbar spine reveals no bony abnormality, no edema, and no ecchymosis.  There  is no step off.  The patient has full range of motion of the lumbar spine with flexion and extension.  The patient has normal lateral bend and rotation.  The patient has no pain with range of motion activities.  The patient has a negative axial load test, and a negative rotational Waddell test.  The patient is mildly tender along the spinous process, mid lumbar spine.  The patient is non tender along the paravertebral muscles, with no muscle spasms.  The patient is non tender along the iliac crest.  The patient is non tender in the sciatic notch.  The patient is non tender along the Sacroiliac joint.  There is no Coccyx joint tenderness.    Bilateral Lower Extremities: Examination of the lower extremities reveals no bony abnormality, no edema, and no ecchymosis.  The patient has full active and passive range of motion of the hips, knees, and ankles.  There is no discomfort with range of motion exercises.  The patient is non tender along the greater trochanter region.  The patient has a negative Denna Haggard' test bilaterally.  There is normal skin warmth.  There is normal capillary refill bilaterally.    Neurologic: The patient has a negative straight leg raise.  The patient has normal muscle strength testing for the quadriceps, calves, ankle dorsiflexion, ankle plantarflexion, and extensor hallicus longus.  The patient has sensation that is intact to light touch.    Neurological: He is alert.  Skin: Skin is warm and dry.  Psychiatric: He has a normal mood and affect.  Nursing note and vitals reviewed.    ED Treatments / Results  Labs (all labs ordered are listed, but only abnormal results are displayed) Labs Reviewed - No data to display  EKG  EKG Interpretation None       Radiology Dg Lumbar Spine 2-3 Views  Result Date: 04/22/2016 CLINICAL DATA:  Lumbosacral back pain. Pain radiates down both legs. EXAM: LUMBAR SPINE - 2-3 VIEW COMPARISON:  Reformats from CT abdomen/ pelvis 03/27/2016  FINDINGS: The alignment is maintained. Vertebral body heights are normal. There is no listhesis. The posterior elements are intact. Disc spaces are preserved. No fracture. Limbic vertebra at T10, as seen on CT. Sacroiliac joints are symmetric and normal. IMPRESSION: Negative radiographs of the lumbar spine. Electronically Signed   By: Rubye Oaks M.D.   On: 04/22/2016 21:28    Procedures Procedures (including critical care time)  Medications Ordered in ED Medications - No data to display   Initial Impression / Assessment and Plan / ED Course  I have reviewed the triage vital signs and the nursing notes.  Pertinent labs & imaging results that were available during my care of the patient were reviewed by me and considered in my medical decision making (see chart for details).     Patient with bilateral lumbar radiculopathy. He is given a prescription for Norco for severe pain. Creatinine elevated will  avoid NSAIDs. Patient is advised to follow-up with spine surgeon for discussion of lower back pain and radicular symptoms. He is educated on signs symptoms return to the ED for.  Final Clinical Impressions(s) / ED Diagnoses   Final diagnoses:  Chronic bilateral low back pain with bilateral sciatica    New Prescriptions New Prescriptions   HYDROCODONE-ACETAMINOPHEN (NORCO) 5-325 MG TABLET    Take 1 tablet by mouth every 6 (six) hours as needed for moderate pain.     Evon Slackhomas C Breslin Burklow, PA-C 04/22/16 2202    Phineas SemenGraydon Goodman, MD 04/22/16 2245

## 2016-04-22 NOTE — ED Triage Notes (Signed)
Pt states has had low back pain for 6 months since an accident. Pt states was seen here 6 months ago and diagnosed with a slipped disc in his neck. Pt states was having back pain then, "but I think something was missed and I want to make sure it wasn't." pt appears in no acute distress and is ambulatory without difficulty.

## 2016-04-22 NOTE — Discharge Instructions (Signed)
Please call orthopedics, scheduled follow-up appointment. Return to the ER for any worsening symptoms or urgent changes in her health.

## 2016-04-22 NOTE — ED Notes (Addendum)
Patient c/o upper back/neck injury 6 months ago with resulting surgery. Pt c/o lower back pain. Pt reports he was not checked for lower back injury after initial injury. Patient reports pain/discomfort radiates down bilateral legs

## 2016-04-24 ENCOUNTER — Emergency Department
Admission: EM | Admit: 2016-04-24 | Discharge: 2016-04-24 | Disposition: A | Discharge status: Home / Self Care | Payer: PRIVATE HEALTH INSURANCE | Attending: Emergency Medicine | Admitting: Emergency Medicine

## 2016-04-24 ENCOUNTER — Encounter: Payer: Self-pay | Admitting: Emergency Medicine

## 2016-04-24 DIAGNOSIS — M5441 Lumbago with sciatica, right side: Secondary | ICD-10-CM | POA: Insufficient documentation

## 2016-04-24 DIAGNOSIS — R1013 Epigastric pain: Secondary | ICD-10-CM | POA: Insufficient documentation

## 2016-04-24 DIAGNOSIS — M5431 Sciatica, right side: Secondary | ICD-10-CM

## 2016-04-24 DIAGNOSIS — R109 Unspecified abdominal pain: Secondary | ICD-10-CM

## 2016-04-24 DIAGNOSIS — F17210 Nicotine dependence, cigarettes, uncomplicated: Secondary | ICD-10-CM | POA: Insufficient documentation

## 2016-04-24 DIAGNOSIS — G8929 Other chronic pain: Secondary | ICD-10-CM | POA: Insufficient documentation

## 2016-04-24 MED ORDER — METHYLPREDNISOLONE 4 MG PO TABS: ORAL_TABLET | ORAL | 0 refills | Status: DC

## 2016-04-24 MED ORDER — ACETAMINOPHEN 500 MG PO TABS
1000.0000 mg | ORAL_TABLET | Freq: Once | ORAL | Status: DC
  Filled 2016-04-24: qty 2

## 2016-04-24 NOTE — Discharge Instructions (Signed)
You may continue to take your Robaxin and Flexeril for muscle spasms. In addition, we will start you on a Medrol Dosepak which should decrease your symptoms. You may take Tylenol for mild to moderate pain. Avoid all NSAID medications at this time, including Aleve, naproxen, Advil, Motrin, or ibuprofen. You may apply ice or heat, whichever feels better, to your low back for 10 minutes every 2 hours when you're awake decreased pain. Avoid lifting heavy objects or significant strain on your back. Please schedule an appointment with a primary care physician to talk about your chronic abdominal pain, and a long-term pain management plan for your back pain.  Please schedule an [facility] for your back pain.  Return to the emergency department if you develop severe pain, difficulty walking, changes in your bladder or bowel function, fever, or any other symptoms concerning to you.

## 2016-04-24 NOTE — ED Triage Notes (Signed)
C/O lower back pain, pain radiating down right leg for months.

## 2016-04-24 NOTE — ED Provider Notes (Signed)
[facility] Emergency Department Provider Note  ____________________________________________  Time seen: Approximately 9:09 AM  I have reviewed the triage vital signs and the nursing notes.   HISTORY  Chief Complaint Back Pain    HPI Benjamin Beltran is a 31 y.o. male with a history of C3 surgery 8/17, GERD, presenting with back pain. The patient reports that on 10/11/15 he sustained a C3 disc injury requiring surgery. Since then, he has also had mid lumbar back pain, and right sided back pain radiating to the right buttock and posterior thigh, "but I was focusing on the neck issues." He is here today because he continues to have pain despite Flexeril and Robaxin. He denies any new trauma, or significant exertion, lifting heavy objects. He denies any saddle anesthesia, numbness tingling or weakness, urinary or fecal incontinence or retention. No fevers or intravenous drug abuse. He has not been back to see his spine surgeon for his symptoms.  The patient has also been seen multiple times in the last several weeks for rhabdomyolysis, as well as epigastric pain. He reports that the symptoms have significantly improved. He has not yet established a primary care physician and I have encouraged him to do so for this.   Past Medical History:  Diagnosis Date  . Elevated liver function tests unk    There are no active problems to display for this patient.   Past Surgical History:  Procedure Laterality Date  . tendon surgery to left hand  unk    Current Outpatient Rx  . Order #: [id number] Class: Historical Med  . Order #: [id number] Class: Print  . Order #: [id number] Class: Historical Med  . Order #: [id number] Class: Print    Allergies Patient has no known allergies.  No family history on file.  Social History Social History  Substance Use Topics  . Smoking status: Current Every Day Smoker    Packs/day: 0.50    Types: Cigarettes  . Smokeless tobacco: Never  Used  . Alcohol use Yes    Review of Systems Constitutional: No fever/chills.No lightheadedness or syncope. ENT:No congestion or rhinorrhea. Cardiovascular: Denies chest pain. Denies palpitations. Respiratory: Denies shortness of breath.  No cough. Gastrointestinal: As of mild improving abdominal pain.  No nausea, no vomiting.  No diarrhea.  No constipation. Genitourinary: Negative for dysuria. No urinary or fecal incontinence or retention. Musculoskeletal: Positive for stable neck pain, and months of low back pain radiating to the right buttock and posterior thigh. Skin: Negative for rash. Neurological: Negative for headaches. No focal numbness, tingling or weakness. No difficulty walking.  10-point ROS otherwise negative.  ____________________________________________   PHYSICAL EXAM:  VITAL SIGNS: ED Triage Vitals  Enc Vitals Group     BP --      Pulse --      Resp --      Temp --      Temp src --      SpO2 --      Weight 04/24/16 0802 218 lb (98.9 kg)     Height 04/24/16 0802 5\' 11"  (1.803 m)     Head Circumference --      Peak Flow --      Pain Score 04/24/16 0803 8     Pain Loc --      Pain Edu? --      Excl. in GC? --     Constitutional: Alert and oriented. Well appearing and in no acute distress. Answers questions appropriately. Eyes: Conjunctivae are normal.  EOMI. No  scleral icterus. Head: Atraumatic. Nose: No congestion/rhinnorhea. Mouth/Throat: Mucous membranes are moist.  Neck: No stridor.  Supple.  Full range of motion with minimal discomfort. Some tenderness to palpation in the midline in the mid and lower C-spine without step-offs or deformities; the patient reports this pain is unchanged. Cardiovascular: Normal rate, regular rhythm. No murmurs, rubs or gallops.  Respiratory: Normal respiratory effort.  No accessory muscle use or retractions. Lungs CTAB.  No wheezes, rales or ronchi. Musculoskeletal: Positive diffuse mid lumbar spine tenderness to  palpation without focality; step-offs or deformities. Pain is more prominent in the right lateral back at the level of the lower lumbar spine, and reproduces the pain the patient is presenting for. Neurologic:  A&Ox3.  Speech is clear.  Face and smile are symmetric.  EOMI. normal gait without ataxia. 5 out of 5 dorsi flexion and plantar flexion, hip flexion. Almost sensation to light touch at the bilateral lower 70s. Generalized hyporeflexia on the patellar tendons bilaterally. Skin:  Skin is warm, dry and intact. No rash noted. Psychiatric: Mood and affect are normal. Speech and behavior are normal.  Normal judgement.  ____________________________________________   LABS (all labs ordered are listed, but only abnormal results are displayed)  Labs Reviewed - No data to display ____________________________________________  EKG  Not indicated ____________________________________________  RADIOLOGY  No results found.  ____________________________________________   PROCEDURES  Procedure(s) performed: None  Procedures  Critical Care performed: No ____________________________________________   INITIAL IMPRESSION / ASSESSMENT AND PLAN / ED COURSE  Pertinent labs & imaging results that were available during my care of the patient were reviewed by me and considered in my medical decision making (see chart for details).  31 y.o. male presenting with chronic low back pain radiating to the right buttock and posterior thigh since sustaining an injury requiring cervical spine surgery in August of last year. Overall, the patient does have some discomfort in clinical symptoms that are consistent with sciatica, but I do not see any evidence of spinal cord compression or cauda equina. I do not think he has an epidural abscess and spine mass would be much less likely. I will plan to put the patient on a Medrol Dosepak, and have encouraged him to call his spine surgeon for reevaluation in the next  1-2 days. He understands that he may need follow-up imaging, and is continuing physical therapy at this time. Given the patient's recent reflux, and epigastric pain, as well as rhabdomyolysis, I have asked him to avoid all NSAID medications for his pain. I'll put him on Tylenol, and he can continue his Flexeril and Robaxin. I have also given him instructions about heat therapy and cryotherapy. He will need to establish a primary care physician for his chronic medical illnesses as well as a long-term pain management plan. At this time, the patient is stable for discharge. Return precautions were discussed.  ____________________________________________  FINAL CLINICAL IMPRESSION(S) / ED DIAGNOSES  Final diagnoses:  Sciatica, right side  Chronic abdominal pain         NEW MEDICATIONS STARTED DURING THIS VISIT:  New Prescriptions   METHYLPREDNISOLONE (MEDROL) 4 MG TABLET    Day 1: 8 mg PO before breakfast, 4 mg after lunch and after dinner, and 8 mg at bedtime   Day 2: 4 mg PO before breakfast, after lunch, and after dinner and 8 mg at bedtime  Day 3: 4 mg PO before breakfast, after lunch, after dinner, and at bedtime  Day 4: 4 mg PO before breakfast,  after lunch, and at bedtime   Day 5: 4 mg PO before breakfast and at bedtime  Day 6: 4 mg PO before breakfast      Rockne MenghiniAnne-Caroline Sabryn Preslar, MD 04/24/16 [phone]

## 2016-04-24 NOTE — ED Notes (Signed)
See triage note states he is having lower back pain which is radiating into both legs  States he had a w/c injury to neck which needed surgery  He thinks this may be related

## 2016-08-06 DIAGNOSIS — R1011 Right upper quadrant pain: Secondary | ICD-10-CM | POA: Diagnosis present

## 2016-08-06 DIAGNOSIS — Z5321 Procedure and treatment not carried out due to patient leaving prior to being seen by health care provider: Secondary | ICD-10-CM | POA: Insufficient documentation

## 2016-08-06 LAB — CBC
HCT: 39.2 % — ABNORMAL LOW (ref 40.0–52.0)
HEMOGLOBIN: 13.2 g/dL (ref 13.0–18.0)
MCH: 28.4 pg (ref 26.0–34.0)
MCHC: 33.6 g/dL (ref 32.0–36.0)
MCV: 84.6 fL (ref 80.0–100.0)
PLATELETS: 196 10*3/uL (ref 150–440)
RBC: 4.64 MIL/uL (ref 4.40–5.90)
RDW: 13.4 % (ref 11.5–14.5)
WBC: 5 10*3/uL (ref 3.8–10.6)

## 2016-08-06 LAB — COMPREHENSIVE METABOLIC PANEL
ALBUMIN: 4.2 g/dL (ref 3.5–5.0)
ALK PHOS: 57 U/L (ref 38–126)
ALT: 32 U/L (ref 17–63)
AST: 39 U/L (ref 15–41)
Anion gap: 5 (ref 5–15)
BILIRUBIN TOTAL: 0.5 mg/dL (ref 0.3–1.2)
BUN: 14 mg/dL (ref 6–20)
CALCIUM: 8.9 mg/dL (ref 8.9–10.3)
CO2: 27 mmol/L (ref 22–32)
CREATININE: 1.16 mg/dL (ref 0.61–1.24)
Chloride: 106 mmol/L (ref 101–111)
GFR calc Af Amer: >60 mL/min (ref >60)
GFR calc non Af Amer: >60 mL/min (ref >60)
GLUCOSE: 113 mg/dL — AB (ref 65–99)
Potassium: 3.7 mmol/L (ref 3.5–5.1)
SODIUM: 138 mmol/L (ref 135–145)
Total Protein: 7 g/dL (ref 6.5–8.1)

## 2016-08-06 LAB — URINALYSIS, COMPLETE (UACMP) WITH MICROSCOPIC
Bacteria, UA: NONE SEEN
Bilirubin Urine: NEGATIVE
GLUCOSE, UA: NEGATIVE mg/dL
Hgb urine dipstick: NEGATIVE
Ketones, ur: NEGATIVE mg/dL
Leukocytes, UA: NEGATIVE
Nitrite: NEGATIVE
PROTEIN: NEGATIVE mg/dL
SPECIFIC GRAVITY, URINE: 1.023 (ref 1.005–1.030)
pH: 5 (ref 5.0–8.0)

## 2016-08-06 LAB — LIPASE, BLOOD: Lipase: 30 U/L (ref 11–51)

## 2016-08-06 NOTE — ED Triage Notes (Signed)
Patient reports symptoms off/on for several weeks.  Reports right upper quad pain that radiates through to his back.  Patient denies nausea, vomiting or short of breath.

## 2016-08-07 ENCOUNTER — Emergency Department
Admission: EM | Admit: 2016-08-07 | Discharge: 2016-08-07 | Discharge status: Against Medical Advice | Payer: PRIVATE HEALTH INSURANCE | Attending: Emergency Medicine | Admitting: Emergency Medicine

## 2016-08-10 ENCOUNTER — Inpatient Hospital Stay
Admission: EM | Admit: 2016-08-10 | Discharge: 2016-08-11 | DRG: 683 | Disposition: A | Discharge status: Home / Self Care | Payer: Self-pay | Attending: Internal Medicine | Admitting: Internal Medicine

## 2016-08-10 ENCOUNTER — Encounter: Payer: Self-pay | Admitting: Emergency Medicine

## 2016-08-10 DIAGNOSIS — N179 Acute kidney failure, unspecified: Principal | ICD-10-CM | POA: Diagnosis present

## 2016-08-10 DIAGNOSIS — M6282 Rhabdomyolysis: Secondary | ICD-10-CM | POA: Diagnosis present

## 2016-08-10 DIAGNOSIS — R1011 Right upper quadrant pain: Secondary | ICD-10-CM | POA: Diagnosis present

## 2016-08-10 DIAGNOSIS — F17210 Nicotine dependence, cigarettes, uncomplicated: Secondary | ICD-10-CM | POA: Diagnosis present

## 2016-08-10 DIAGNOSIS — E860 Dehydration: Secondary | ICD-10-CM | POA: Diagnosis present

## 2016-08-10 DIAGNOSIS — I248 Other forms of acute ischemic heart disease: Secondary | ICD-10-CM | POA: Diagnosis present

## 2016-08-10 DIAGNOSIS — R109 Unspecified abdominal pain: Secondary | ICD-10-CM

## 2016-08-10 DIAGNOSIS — Z7982 Long term (current) use of aspirin: Secondary | ICD-10-CM

## 2016-08-10 LAB — COMPREHENSIVE METABOLIC PANEL
ALBUMIN: 4.7 g/dL (ref 3.5–5.0)
ALT: 35 U/L (ref 17–63)
AST: 50 U/L — AB (ref 15–41)
Alkaline Phosphatase: 63 U/L (ref 38–126)
Anion gap: 8 (ref 5–15)
BILIRUBIN TOTAL: 0.6 mg/dL (ref 0.3–1.2)
BUN: 15 mg/dL (ref 6–20)
CHLORIDE: 99 mmol/L — AB (ref 101–111)
CO2: 28 mmol/L (ref 22–32)
Calcium: 9.5 mg/dL (ref 8.9–10.3)
Creatinine, Ser: 1.73 mg/dL — ABNORMAL HIGH (ref 0.61–1.24)
GFR calc Af Amer: 59 mL/min — ABNORMAL LOW (ref >60)
GFR calc non Af Amer: 51 mL/min — ABNORMAL LOW (ref >60)
GLUCOSE: 93 mg/dL (ref 65–99)
Potassium: 3.7 mmol/L (ref 3.5–5.1)
SODIUM: 135 mmol/L (ref 135–145)
TOTAL PROTEIN: 7.8 g/dL (ref 6.5–8.1)

## 2016-08-10 LAB — URINALYSIS, COMPLETE (UACMP) WITH MICROSCOPIC
BACTERIA UA: NONE SEEN
BILIRUBIN URINE: NEGATIVE
Glucose, UA: NEGATIVE mg/dL
Hgb urine dipstick: NEGATIVE
KETONES UR: NEGATIVE mg/dL
Leukocytes, UA: NEGATIVE
Nitrite: NEGATIVE
PROTEIN: 100 mg/dL — AB
Specific Gravity, Urine: 1.023 (ref 1.005–1.030)
pH: 5 (ref 5.0–8.0)

## 2016-08-10 LAB — LIPASE, BLOOD: Lipase: 34 U/L (ref 11–51)

## 2016-08-10 LAB — CBC
HEMATOCRIT: 43.3 % (ref 40.0–52.0)
HEMOGLOBIN: 14.4 g/dL (ref 13.0–18.0)
MCH: 28.6 pg (ref 26.0–34.0)
MCHC: 33.4 g/dL (ref 32.0–36.0)
MCV: 85.8 fL (ref 80.0–100.0)
Platelets: 202 10*3/uL (ref 150–440)
RBC: 5.04 MIL/uL (ref 4.40–5.90)
RDW: 13.4 % (ref 11.5–14.5)
WBC: 9.2 10*3/uL (ref 3.8–10.6)

## 2016-08-10 LAB — TROPONIN I: Troponin I: 0.06 ng/mL (ref <0.03)

## 2016-08-10 LAB — CK: Total CK: 1054 U/L — ABNORMAL HIGH (ref 49–397)

## 2016-08-10 MED ORDER — ONDANSETRON HCL 4 MG/2ML IJ SOLN
4.0000 mg | Freq: Once | INTRAMUSCULAR | Status: AC
Start: 2016-08-11 — End: 2016-08-10
  Administered 2016-08-10: 4 mg via INTRAVENOUS
  Filled 2016-08-10: qty 2

## 2016-08-10 MED ORDER — SODIUM CHLORIDE 0.9 % IV BOLUS (SEPSIS)
1000.0000 mL | Freq: Once | INTRAVENOUS | Status: AC
  Administered 2016-08-11: 1000 mL via INTRAVENOUS

## 2016-08-10 MED ORDER — SODIUM CHLORIDE 0.9 % IV BOLUS (SEPSIS)
1000.0000 mL | Freq: Once | INTRAVENOUS | Status: AC
  Administered 2016-08-10: 1000 mL via INTRAVENOUS

## 2016-08-10 MED ORDER — MORPHINE SULFATE (PF) 2 MG/ML IV SOLN
2.0000 mg | Freq: Once | INTRAVENOUS | Status: AC
  Administered 2016-08-10: 2 mg via INTRAVENOUS
  Filled 2016-08-10: qty 1

## 2016-08-10 MED ORDER — ASPIRIN 81 MG PO CHEW
324.0000 mg | CHEWABLE_TABLET | Freq: Once | ORAL | Status: AC
  Administered 2016-08-10: 324 mg via ORAL
  Filled 2016-08-10: qty 4

## 2016-08-10 NOTE — ED Notes (Signed)
Lab called about add on CK, states they will return call to this RN if they need it redrawn.

## 2016-08-10 NOTE — ED Notes (Signed)
Troponin 0.06; charge nurse notified

## 2016-08-10 NOTE — ED Triage Notes (Signed)
Pt ambulatory to triage in NAD, report RUQ pain x weeks, report came to ED few days ago but LWAT.

## 2016-08-10 NOTE — ED Notes (Addendum)
Pt reports left upper dental bicuspid that broke "about 3 or more weeks ago". Pt states he pulled some of the tooth particles out however reports some are still in the gum. Pt reports pain to the area.

## 2016-08-11 ENCOUNTER — Inpatient Hospital Stay: Payer: Self-pay

## 2016-08-11 DIAGNOSIS — M6282 Rhabdomyolysis: Secondary | ICD-10-CM | POA: Diagnosis present

## 2016-08-11 DIAGNOSIS — N179 Acute kidney failure, unspecified: Secondary | ICD-10-CM

## 2016-08-11 LAB — MAGNESIUM: MAGNESIUM: 1.9 mg/dL (ref 1.7–2.4)

## 2016-08-11 LAB — URINE DRUG SCREEN, QUALITATIVE (ARMC ONLY)
AMPHETAMINES, UR SCREEN: NOT DETECTED
BENZODIAZEPINE, UR SCRN: NOT DETECTED
Barbiturates, Ur Screen: NOT DETECTED
COCAINE METABOLITE, UR ~~LOC~~: NOT DETECTED
Cannabinoid 50 Ng, Ur ~~LOC~~: POSITIVE — AB
MDMA (Ecstasy)Ur Screen: NOT DETECTED
METHADONE SCREEN, URINE: NOT DETECTED
OPIATE, UR SCREEN: NOT DETECTED
Phencyclidine (PCP) Ur S: NOT DETECTED
TRICYCLIC, UR SCREEN: NOT DETECTED

## 2016-08-11 LAB — PHOSPHORUS: Phosphorus: 4.2 mg/dL (ref 2.5–4.6)

## 2016-08-11 LAB — COMPREHENSIVE METABOLIC PANEL
ALT: 29 U/L (ref 17–63)
AST: 40 U/L (ref 15–41)
Albumin: 3.9 g/dL (ref 3.5–5.0)
Alkaline Phosphatase: 51 U/L (ref 38–126)
Anion gap: 3 — ABNORMAL LOW (ref 5–15)
BUN: 15 mg/dL (ref 6–20)
CHLORIDE: 106 mmol/L (ref 101–111)
CO2: 28 mmol/L (ref 22–32)
CREATININE: 1.31 mg/dL — AB (ref 0.61–1.24)
Calcium: 8.5 mg/dL — ABNORMAL LOW (ref 8.9–10.3)
Glucose, Bld: 91 mg/dL (ref 65–99)
POTASSIUM: 3.8 mmol/L (ref 3.5–5.1)
Sodium: 137 mmol/L (ref 135–145)
TOTAL PROTEIN: 6.5 g/dL (ref 6.5–8.1)
Total Bilirubin: 0.5 mg/dL (ref 0.3–1.2)

## 2016-08-11 LAB — CBC
HCT: 37.9 % — ABNORMAL LOW (ref 40.0–52.0)
Hemoglobin: 12.6 g/dL — ABNORMAL LOW (ref 13.0–18.0)
MCH: 28.2 pg (ref 26.0–34.0)
MCHC: 33.2 g/dL (ref 32.0–36.0)
MCV: 84.9 fL (ref 80.0–100.0)
PLATELETS: 177 10*3/uL (ref 150–440)
RBC: 4.46 MIL/uL (ref 4.40–5.90)
RDW: 13.4 % (ref 11.5–14.5)
WBC: 6.6 10*3/uL (ref 3.8–10.6)

## 2016-08-11 LAB — LIPID PANEL
CHOL/HDL RATIO: 3.6 ratio
Cholesterol: 159 mg/dL (ref 0–200)
HDL: 44 mg/dL (ref >40)
LDL CALC: 101 mg/dL — AB (ref 0–99)
Triglycerides: 71 mg/dL (ref <150)
VLDL: 14 mg/dL (ref 0–40)

## 2016-08-11 LAB — CK: CK TOTAL: 870 U/L — AB (ref 49–397)

## 2016-08-11 LAB — TROPONIN I
TROPONIN I: 0.11 ng/mL — AB (ref <0.03)
TROPONIN I: 0.06 ng/mL — AB (ref <0.03)

## 2016-08-11 LAB — TSH: TSH: 3.063 u[IU]/mL (ref 0.350–4.500)

## 2016-08-11 MED ORDER — ACETAMINOPHEN 325 MG PO TABS
650.0000 mg | ORAL_TABLET | Freq: Four times a day (QID) | ORAL | Status: DC | PRN
Start: 2016-08-11 — End: 2016-08-11

## 2016-08-11 MED ORDER — PANTOPRAZOLE SODIUM 40 MG IV SOLR
40.0000 mg | INTRAVENOUS | Status: DC
  Administered 2016-08-11: 08:00:00 40 mg via INTRAVENOUS
  Filled 2016-08-11: qty 40

## 2016-08-11 MED ORDER — ZOLPIDEM TARTRATE 5 MG PO TABS: 5.0000 mg | ORAL_TABLET | Freq: Every evening | ORAL | Status: DC | PRN

## 2016-08-11 MED ORDER — ALBUTEROL SULFATE (2.5 MG/3ML) 0.083% IN NEBU: 2.5000 mg | INHALATION_SOLUTION | Freq: Four times a day (QID) | RESPIRATORY_TRACT | Status: DC | PRN

## 2016-08-11 MED ORDER — MAGNESIUM CITRATE PO SOLN
1.0000 | Freq: Once | ORAL | Status: DC | PRN
  Filled 2016-08-11: qty 296

## 2016-08-11 MED ORDER — ASPIRIN EC 81 MG PO TBEC
81.0000 mg | DELAYED_RELEASE_TABLET | Freq: Every day | ORAL | Status: DC
  Administered 2016-08-11: 81 mg via ORAL
  Filled 2016-08-11: qty 1

## 2016-08-11 MED ORDER — SENNOSIDES-DOCUSATE SODIUM 8.6-50 MG PO TABS: 1.0000 | ORAL_TABLET | Freq: Every evening | ORAL | Status: DC | PRN

## 2016-08-11 MED ORDER — MORPHINE SULFATE (PF) 2 MG/ML IV SOLN
1.0000 mg | INTRAVENOUS | Status: DC | PRN
  Filled 2016-08-11: qty 1

## 2016-08-11 MED ORDER — ENOXAPARIN SODIUM 100 MG/ML ~~LOC~~ SOLN
95.0000 mg | Freq: Once | SUBCUTANEOUS | Status: AC
  Administered 2016-08-11: 95 mg via SUBCUTANEOUS
  Filled 2016-08-11: qty 1

## 2016-08-11 MED ORDER — ACETAMINOPHEN 650 MG RE SUPP: 650.0000 mg | Freq: Four times a day (QID) | RECTAL | Status: DC | PRN

## 2016-08-11 MED ORDER — OXYCODONE HCL 5 MG PO TABS: 5.0000 mg | ORAL_TABLET | ORAL | Status: DC | PRN

## 2016-08-11 MED ORDER — ONDANSETRON HCL 4 MG PO TABS: 4.0000 mg | ORAL_TABLET | Freq: Four times a day (QID) | ORAL | Status: DC | PRN

## 2016-08-11 MED ORDER — SODIUM CHLORIDE 0.9% FLUSH
3.0000 mL | Freq: Two times a day (BID) | INTRAVENOUS | Status: DC
  Administered 2016-08-11 (×2): 3 mL via INTRAVENOUS

## 2016-08-11 MED ORDER — IPRATROPIUM BROMIDE 0.02 % IN SOLN: 0.5000 mg | Freq: Four times a day (QID) | RESPIRATORY_TRACT | Status: DC | PRN

## 2016-08-11 MED ORDER — SODIUM CHLORIDE 0.9 % IV SOLN
INTRAVENOUS | Status: DC
  Administered 2016-08-11: 04:00:00 via INTRAVENOUS

## 2016-08-11 MED ORDER — BISACODYL 5 MG PO TBEC: 5.0000 mg | DELAYED_RELEASE_TABLET | Freq: Every day | ORAL | Status: DC | PRN

## 2016-08-11 MED ORDER — ONDANSETRON HCL 4 MG/2ML IJ SOLN: 4.0000 mg | Freq: Four times a day (QID) | INTRAMUSCULAR | Status: DC | PRN

## 2016-08-11 NOTE — Progress Notes (Signed)
ANTICOAGULATION CONSULT NOTE - Initial Consult  Pharmacy Consult for lovenox Indication: elevated trop  Allergies  Allergen Reactions  . Antihistamines, Diphenhydramine-Type     liver    Patient Measurements: Height: 5\' 11"  (180.3 cm) Weight: 209 lb 1.6 oz (94.8 kg) IBW/kg (Calculated) : 75.3 Heparin Dosing Weight: 95 kg  Vital Signs: Temp: 97.5 F (36.4 C) (06/22 0332) Temp Source: Oral (06/22 0332) BP: 113/69 (06/22 0332) Pulse Rate: 47 (06/22 0332)  Labs:  Recent Labs  08/10/16 2144 08/11/16 0305 08/11/16 0514  HGB 14.4  --  12.6*  HCT 43.3  --  37.9*  PLT 202  --  177  CREATININE 1.73*  --   --   CKTOTAL 1,054*  --   --   TROPONINI 0.06* 0.11*  --     Estimated Creatinine Clearance: 72.7 mL/min (A) (by C-G formula based on SCr of 1.73 mg/dL (H)).   Medical History: Past Medical History:  Diagnosis Date  . Elevated liver function tests unk    Medications:  Scheduled:  . aspirin EC  81 mg Oral Daily  . enoxaparin (LOVENOX) injection  95 mg Subcutaneous Once  . pantoprazole (PROTONIX) IV  40 mg Intravenous Q24H  . sodium chloride flush  3 mL Intravenous Q12H    Assessment: Patient admitted for RUQ, but one time dose of lovenox ordered for concern of rising trop of 0.11  Goal of Therapy:  Monitor platelets by anticoagulation protocol: Yes   Plan:  Lovenox 1 mg/kg -- 95 mg subq x 1  Thomasene Rippleavid Carmel Garfield, PharmD, BCPS Clinical Pharmacist 08/11/2016

## 2016-08-11 NOTE — Discharge Instructions (Signed)
[facility] [place] [facility] ° °DIET:  °Regular diet ° °DISCHARGE CONDITION:  °Stable ° °ACTIVITY:  °Activity as tolerated ° °OXYGEN:  °Home Oxygen: No. °  °Oxygen Delivery: room air ° °DISCHARGE LOCATION:  °home  ° ° °ADDITIONAL DISCHARGE INSTRUCTION: ° ° °If you experience worsening of your admission symptoms, develop shortness of breath, life threatening emergency, suicidal or homicidal thoughts you must seek medical attention immediately by calling 911 or calling your MD immediately  if symptoms less severe. ° °You Must read complete instructions/literature along with all the possible adverse reactions/side effects for all the Medicines you take and that have been prescribed to you. Take any new Medicines after you have completely understood and accpet all the possible adverse reactions/side effects.  ° °Please note ° °You were cared for by a hospitalist during your hospital stay. If you have any questions about your discharge medications or the care you received while you were in the hospital after you are discharged, you can call the unit and asked to speak with the hospitalist on call if the hospitalist that took care of you is not available. Once you are discharged, your primary care physician will handle any further medical issues. Please note that NO REFILLS for any discharge medications will be authorized once you are discharged, as it is imperative that you return to your primary care physician (or establish a relationship with a primary care physician if you do not have one) for your aftercare needs so that they can reassess your need for medications and monitor your lab values. ° ° °

## 2016-08-11 NOTE — ED Provider Notes (Signed)
[facility] Emergency Department Provider Note    First MD Initiated Contact with Patient 08/10/16 2302     (approximate)  I have reviewed the triage vital signs and the nursing notes.   HISTORY  Chief Complaint Abdominal Pain   HPI Benjamin Beltran is a 31 y.o. male with history of rhabdomyolysis with concomitant renal insufficiency presents to the emergency department with right upper quadrant abdominal pain times "weeks". Patient also admits to occasional dizziness. Patient denies any chest pain no shortness of breath no nausea or vomiting. Patient denies any diarrhea or constipation. Patient denies any fever afebrile on presentation. Patient does admit to cocaine use in frequently in the past stating that his last use was one year ago. Patient does admit to marijuana use. Patient admits to vigorous exercise daily lasting hours.   Past Medical History:  Diagnosis Date  . Elevated liver function tests unk    Patient Active Problem List   Diagnosis Date Noted  . Acute renal failure due to rhabdomyolysis (HCC) 08/11/2016    Past Surgical History:  Procedure Laterality Date  . tendon surgery to left hand  unk    Prior to Admission medications   Not on File    Allergies Antihistamines, diphenhydramine-type  History reviewed. No pertinent family history.  Social History Social History  Substance Use Topics  . Smoking status: Current Every Day Smoker    Packs/day: 0.50    Types: Cigarettes  . Smokeless tobacco: Never Used  . Alcohol use Yes    Review of Systems Constitutional: No fever/chills Eyes: No visual changes. ENT: No sore throat. Cardiovascular: Denies chest pain. Respiratory: Denies shortness of breath. Gastrointestinal: No abdominal pain.  No nausea, no vomiting.  No diarrhea.  No constipation. Genitourinary: Negative for dysuria. Musculoskeletal: Negative for neck pain.  Negative for back pain. Integumentary: Negative for  rash. Neurological: Negative for headaches, focal weakness or numbness.   ____________________________________________   PHYSICAL EXAM:  VITAL SIGNS: ED Triage Vitals  Enc Vitals Group     BP 08/10/16 2142 101/68     Pulse Rate 08/10/16 2142 77     Resp 08/10/16 2142 16     Temp 08/10/16 2142 98.6 F (37 C)     Temp Source 08/10/16 2142 Oral     SpO2 08/10/16 2142 99 %     Weight 08/10/16 2143 95.3 kg (210 lb)     Height 08/10/16 2143 1.803 m (5\' 11" )     Head Circumference --      Peak Flow --      Pain Score 08/10/16 2142 8     Pain Loc --      Pain Edu? --      Excl. in GC? --     Constitutional: Alert and oriented. Well appearing and in no acute distress. Eyes: Conjunctivae are normal.  Head: Atraumatic. Mouth/Throat: Mucous membranes are moist.  Oropharynx non-erythematous. Neck: No stridor.   Cardiovascular: Normal rate, regular rhythm. Good peripheral circulation. Grossly normal heart sounds. Respiratory: Normal respiratory effort.  No retractions. Lungs CTAB. Gastrointestinal: Soft and nontender. No distention. :  Musculoskeletal: No lower extremity tenderness nor edema. No gross deformities of extremities. Neurologic:  Normal speech and language. No gross focal neurologic deficits are appreciated.  Skin:  Skin is warm, dry and intact. No rash noted. Psychiatric: Mood and affect are normal. Speech and behavior are normal.  ____________________________________________   LABS (all labs ordered are listed, but only abnormal results are displayed)  Labs Reviewed  COMPREHENSIVE METABOLIC PANEL - Abnormal; Notable for the following:       Result Value   Chloride 99 (*)    Creatinine, Ser 1.73 (*)    AST 50 (*)    GFR calc non Af Amer 51 (*)    GFR calc Af Amer 59 (*)    All other components within normal limits  URINALYSIS, COMPLETE (UACMP) WITH MICROSCOPIC - Abnormal; Notable for the following:    Color, Urine AMBER (*)    APPearance CLOUDY (*)     Protein, ur 100 (*)    Squamous Epithelial / LPF 0-5 (*)    All other components within normal limits  TROPONIN I - Abnormal; Notable for the following:    Troponin I 0.06 (*)    All other components within normal limits  CK - Abnormal; Notable for the following:    Total CK 1,054 (*)    All other components within normal limits  URINE DRUG SCREEN, QUALITATIVE ([facility] ONLY) - Abnormal; Notable for the following:    Cannabinoid 50 Ng, Ur [place] POSITIVE (*)    All other components within normal limits  TROPONIN I - Abnormal; Notable for the following:    Troponin I 0.11 (*)    All other components within normal limits  CBC - Abnormal; Notable for the following:    Hemoglobin 12.6 (*)    HCT 37.9 (*)    All other components within normal limits  COMPREHENSIVE METABOLIC PANEL - Abnormal; Notable for the following:    Creatinine, Ser 1.31 (*)    Calcium 8.5 (*)    Anion gap 3 (*)    All other components within normal limits  LIPID PANEL - Abnormal; Notable for the following:    LDL Cholesterol 101 (*)    All other components within normal limits  LIPASE, BLOOD  CBC  MAGNESIUM  PHOSPHORUS  TSH  TROPONIN I  TROPONIN I  HIV ANTIBODY (ROUTINE TESTING)   ____________________________________________  EKG  ED ECG REPORT I, Hudson N BROWN, the attending physician, personally viewed and interpreted this ECG.   Date: 08/11/2016  EKG Time: 9:46 PM  Rate: 69  Rhythm: Normal sinus rhythm  Axis: Normal  Intervals: Normal  ST&T Change: Inferior T-wave inversion    Procedures   ____________________________________________   INITIAL IMPRESSION / ASSESSMENT AND PLAN / ED COURSE  Pertinent labs & imaging results that were available during my care of the patient were reviewed by me and considered in my medical decision making (see chart for details).  31 year old male presenting with right upper quadrant abdominal pain laboratory data consistent with elevated CK and troponin at  0.06. Of note patient's creatinine also elevated at 1.73      ____________________________________________  FINAL CLINICAL IMPRESSION(S) / ED DIAGNOSES  Rhabdomyolysis Renal insufficiency NSTEMI  MEDICATIONS GIVEN DURING THIS VISIT:  Medications  0.9 %  sodium chloride infusion ( Intravenous New Bag/Given 08/11/16 0412)  acetaminophen (TYLENOL) tablet 650 mg (not administered)    Or  acetaminophen (TYLENOL) suppository 650 mg (not administered)  oxyCODONE (Oxy IR/ROXICODONE) immediate release tablet 5 mg (not administered)  zolpidem (AMBIEN) tablet 5 mg (not administered)  senna-docusate (Senokot-S) tablet 1 tablet (not administered)  bisacodyl (DULCOLAX) EC tablet 5 mg (not administered)  magnesium citrate solution 1 Bottle (not administered)  ondansetron (ZOFRAN) tablet 4 mg (not administered)    Or  ondansetron (ZOFRAN) injection 4 mg (not administered)  albuterol (PROVENTIL) (2.5 MG/3ML) 0.083% nebulizer solution 2.5 mg (not  administered)  ipratropium (ATROVENT) nebulizer solution 0.5 mg (not administered)  sodium chloride flush (NS) 0.9 % injection 3 mL (3 mLs Intravenous Given 08/11/16 0412)  pantoprazole (PROTONIX) injection 40 mg (40 mg Intravenous Given 08/11/16 0802)  morphine 2 MG/ML injection 1 mg (not administered)  aspirin EC tablet 81 mg (not administered)  morphine 2 MG/ML injection 2 mg (2 mg Intravenous Given 08/10/16 2353)  ondansetron (ZOFRAN) injection 4 mg (4 mg Intravenous Given 08/10/16 2354)  aspirin chewable tablet 324 mg (324 mg Oral Given 08/10/16 2353)  sodium chloride 0.9 % bolus 1,000 mL (0 mLs Intravenous Stopped 08/11/16 0049)  sodium chloride 0.9 % bolus 1,000 mL (0 mLs Intravenous Stopped 08/11/16 0041)  enoxaparin (LOVENOX) injection 95 mg (95 mg Subcutaneous Given 08/11/16 0701)     NEW OUTPATIENT MEDICATIONS STARTED DURING THIS VISIT:  There are no discharge medications for this patient.   There are no discharge medications for this  patient.   There are no discharge medications for this patient.    Note:  This document was prepared using Dragon voice recognition software and may include unintentional dictation errors.    Darci Current, MD 08/11/16 [phone]

## 2016-08-11 NOTE — H&P (Signed)
History and Physical   [facility] - [place] [facility] Admission History and Physical [employer], D.O.    Patient Name: Benjamin Beltran MR#: [id number] Date of Birth: 20-Feb-1986 Date of Admission: 08/10/2016  Referring MD/NP/PA: Dr. Manson Passey Primary Care Physician: Patient, No Pcp Per Patient coming from: Home  Chief Complaint:  Chief Complaint  Patient presents with  . Abdominal Pain    HPI: Benjamin Beltran is a 31 y.o. male with  presents to the emergency department for evaluation of abdominal pain.  Patient States that he has had midepigastric and right upper abdominal pain for several weeks which has been intermittent, is worse with food intake and radiates through to his back. Patient denies fevers/chills, weakness, dizziness, chest pain, shortness of breath, N/V/C/D, dysuria/frequency, changes in mental status.   Of note patient states that he does strenuous exercise including weight lifting and running every single day. He tries to stay adequately hydrated. In the emergency department he was found to have acute renal failure secondary to rhabdomyolysis with an elevated CK and elevated troponin for which she received IV fluids and aspirin. Patient denies any cocaine use although he last used about one year ago. He also denies any supplements, steroids, NSAIDs. He has had rhabdomyolysis in the past and it was thought to be related to supplement use and cocaine use. He has also had prior emergency department visits for abdominal pain was thought to have gastritis, esophagitis.  Patient also reports that he had a left upper tooth that broke several weeks ago which he pulled out with applier indicating that he has retained root and tooth particles in the gum. He complains of pain and swelling in that area. He has not seen a dentist however he has not had any fevers.   Otherwise there has been no change in status. Patient has been taking medication as prescribed and there has been  no recent change in medication or diet.  No recent antibiotics.  There has been no recent illness, hospitalizations, travel or sick contacts.    Review of Systems:  CONSTITUTIONAL: No fever/chills, fatigue, weakness, weight gain/loss, headache. EYES: No blurry or double vision. ENT: No tinnitus, postnasal drip, redness or soreness of the oropharynx. RESPIRATORY: No cough, dyspnea, wheeze.  No hemoptysis.  CARDIOVASCULAR: No chest pain, palpitations, syncope, orthopnea. No lower extremity edema.  GASTROINTESTINAL: Positive abdominal pain No nausea, vomiting,  diarrhea, constipation.  No hematemesis, melena or hematochezia. GENITOURINARY: No dysuria, frequency, hematuria. ENDOCRINE: No polyuria or nocturia. No heat or cold intolerance. HEMATOLOGY: No anemia, bruising, bleeding. INTEGUMENTARY: No rashes, ulcers, lesions. MUSCULOSKELETAL: No arthritis, gout, dyspnea. NEUROLOGIC: No numbness, tingling, ataxia, seizure-type activity, weakness. PSYCHIATRIC: No anxiety, depression, insomnia.   Past Medical History:  Diagnosis Date  . Elevated liver function tests unk    Past Surgical History:  Procedure Laterality Date  . tendon surgery to left hand  unk     reports that he has been smoking Cigarettes.  He has been smoking about 0.50 packs per day. He has never used smokeless tobacco. He reports that he drinks alcohol. He reports that he uses drugs, including Marijuana.  No Known Allergies  History reviewed. No pertinent family history.  Prior to Admission medications   Medication Sig Start Date End Date Taking? Authorizing Provider  cyclobenzaprine (FLEXERIL) 5 MG tablet Take 5 mg by mouth 3 (three) times daily as needed for muscle spasms.    [provider]  famotidine (PEPCID) 20 MG tablet Take 1 tablet (20 mg  total) by mouth 2 (two) times daily. 03/27/16   Emily FilbertWilliams, Jonathan E, MD  methocarbamol (ROBAXIN) 500 MG tablet Take 500 mg by mouth every 6 (six) hours as needed for  muscle spasms.    [provider]  methylPREDNISolone (MEDROL) 4 MG tablet Day 1: 8 mg PO before breakfast, 4 mg after lunch and after dinner, and 8 mg at bedtime   Day 2: 4 mg PO before breakfast, after lunch, and after dinner and 8 mg at bedtime  Day 3: 4 mg PO before breakfast, after lunch, after dinner, and at bedtime  Day 4: 4 mg PO before breakfast, after lunch, and at bedtime   Day 5: 4 mg PO before breakfast and at bedtime  Day 6: 4 mg PO before breakfast 04/24/16   Rockne MenghiniNorman, Anne-Caroline, MD    Physical Exam: Vitals:   08/10/16 2142 08/10/16 2143 08/10/16 2330  BP: 101/68  104/63  Pulse: 77  (!) 59  Resp: 16  13  Temp: 98.6 F (37 C)    TempSrc: Oral    SpO2: 99%  100%  Weight:  95.3 kg (210 lb)   Height:  5\' 11"  (1.803 m)     GENERAL: 31 y.o.-year-old Male patient, well-developed, well-nourished lying in the bed in no acute distress.  Pleasant and cooperative.   HEENT: Head atraumatic, normocephalic. Pupils equal, round, reactive to light and accommodation. No scleral icterus. Extraocular muscles intact. Nares are patent. Oropharynx is clear. Mucus membranes moist. NECK: Supple, full range of motion. No JVD, no bruit heard. No thyroid enlargement, no tenderness, no cervical lymphadenopathy. CHEST: Normal breath sounds bilaterally. No wheezing, rales, rhonchi or crackles. No use of accessory muscles of respiration.  No reproducible chest wall tenderness.  CARDIOVASCULAR: S1, S2 normal. No murmurs, rubs, or gallops. Cap refill <2 seconds. Pulses intact distally.  ABDOMEN: Soft, nondistended, mild midepigastric tenderness palpation. No rebound, guarding, rigidity. Normoactive bowel sounds present in all four quadrants. No organomegaly or mass. EXTREMITIES: No pedal edema, cyanosis, or clubbing. No calf tenderness or Homan's sign.  NEUROLOGIC: The patient is alert and oriented x 3. Cranial nerves II through XII are grossly intact with no focal sensorimotor deficit.  Muscle strength 5/5 in all extremities. Sensation intact. Gait not checked. PSYCHIATRIC:  Normal affect, mood, thought content. SKIN: Warm, dry, and intact without obvious rash, lesion, or ulcer.    Labs on Admission:  CBC:  Recent Labs Lab 08/06/16 2119 08/10/16 2144  WBC 5.0 9.2  HGB 13.2 14.4  HCT 39.2* 43.3  MCV 84.6 85.8  PLT 196 202   Basic Metabolic Panel:  Recent Labs Lab 08/06/16 2119 08/10/16 2144  NA 138 135  K 3.7 3.7  CL 106 99*  CO2 27 28  GLUCOSE 113* 93  BUN 14 15  CREATININE 1.16 1.73*  CALCIUM 8.9 9.5   GFR: Estimated Creatinine Clearance: 72.9 mL/min (A) (by C-G formula based on SCr of 1.73 mg/dL (H)). Liver Function Tests:  Recent Labs Lab 08/06/16 2119 08/10/16 2144  AST 39 50*  ALT 32 35  ALKPHOS 57 63  BILITOT 0.5 0.6  PROT 7.0 7.8  ALBUMIN 4.2 4.7    Recent Labs Lab 08/06/16 2119 08/10/16 2144  LIPASE 30 34   No results for input(s): AMMONIA in the last 168 hours. Coagulation Profile: No results for input(s): INR, PROTIME in the last 168 hours. Cardiac Enzymes:  Recent Labs Lab 08/10/16 2144  CKTOTAL 1,054*  TROPONINI 0.06*   BNP (last 3 results) No results  for input(s): PROBNP in the last 8760 hours. HbA1C: No results for input(s): HGBA1C in the last 72 hours. CBG: No results for input(s): GLUCAP in the last 168 hours. Lipid Profile: No results for input(s): CHOL, HDL, LDLCALC, TRIG, CHOLHDL, LDLDIRECT in the last 72 hours. Thyroid Function Tests: No results for input(s): TSH, T4TOTAL, FREET4, T3FREE, THYROIDAB in the last 72 hours. Anemia Panel: No results for input(s): VITAMINB12, FOLATE, FERRITIN, TIBC, IRON, RETICCTPCT in the last 72 hours. Urine analysis:    Component Value Date/Time   COLORURINE AMBER (A) 08/10/2016 2144   APPEARANCEUR CLOUDY (A) 08/10/2016 2144   LABSPEC 1.023 08/10/2016 2144   PHURINE 5.0 08/10/2016 2144   GLUCOSEU NEGATIVE 08/10/2016 2144   HGBUR NEGATIVE 08/10/2016 2144    BILIRUBINUR NEGATIVE 08/10/2016 2144   KETONESUR NEGATIVE 08/10/2016 2144   PROTEINUR 100 (A) 08/10/2016 2144   NITRITE NEGATIVE 08/10/2016 2144   LEUKOCYTESUR NEGATIVE 08/10/2016 2144   Sepsis Labs: @LABRCNTIP (procalcitonin:4,lacticidven:4) )No results found for this or any previous visit (from the past 240 hour(s)).   Radiological Exams on Admission: No results found.  EKG: Normal sinus rhythm at 69 bpm with normal axis, , T-wave inversions in leads II, III, aVF, and V3 and nonspecific ST-T wave changes.   Assessment/Plan  This is a 31 y.o. male with a history of rhabdomyolysis in the past now being admitted with:  #. Acute kidney injury and elevated troponin secondary to rhabdomyolysis -Admit inpatient telemetry monitoring -Aggressive IV fluid hydration -Trend CK, troponin -Check TSH and lipids -Continue aspirin -Advised adequate hydration and avoidance of supplementation and cocaine -Urine toxicology is pending  #. Abdominal pain with normal lipase and LFTs -We'll check abdominal ultrasound -Pain control, antiemetics -Nothing by mouth -IV Protonix  #. Dental pain following self extraction of tooth -Patient will need dentist -No evidence of active infection at this time  Admission status: Inpatient IV Fluids: Normal saline Diet/Nutrition: Nothing by mouth Consults called: None  DVT Px:  SCDs and early ambulation. Code Status: Full Code  Disposition Plan: To home in 1-2 days  All the records are reviewed and case discussed with ED provider. Management plans discussed with the patient and/or family who express understanding and agree with plan of care.  Ramil Edgington D.O. on 08/11/2016 at 1:21 AM Between 7am to 6pm - Pager - [phone] After 6pm go to [email or web address] - password [email or web address] [facility] [place] [facility] Office [phone] CC: Primary care physician; Patient, No Pcp Per   08/11/2016, 1:21 AM

## 2016-08-11 NOTE — Discharge Summary (Signed)
[facility] [place] at [facility]  Benjamin SpireJeremy D Beltran, [place]31 y.o., DOB [birth year], MRN [id number]. Admission date: 08/10/2016 Discharge Date 08/11/2016 Primary MD Patient, No Pcp Per Admitting Physician Tonye RoyaltyAlexis Hugelmeyer, DO  Admission Diagnosis  abdominal pain  Discharge Diagnosis   Active Problems:   Acute renal failure due to rhabdomyolysis (HCC) and dehydration not improved   Abdominal pain with nornal lipase and lft's  Elevated troponin due to demand ischemia and rhabdomyolysis        Hospital Course Deanne CofferJeremy Rawlinson is a 31 y.o. male with  presents to the emergency department for evaluation of abdominal pain.  Patient States that he has had midepigastric and right upper abdominal pain for several weeks which has been intermittent, is worse with food intake and radiates through to his back. Patient was evaluated and the need ER and was noted to have elevated CPK as well as slightly abnormal troponin. He was also noted to be in renal failure and dehydrated. He had abdominal pain however that resolved. Ultrasound of the abdomen was normal. He is doing much better denies any complaints was to go home.             Consults  None  Significant Tests:  See full reports for all details     [place] Abdomen Complete  Result Date: 08/11/2016 CLINICAL DATA:  [id number] year old male with a history of abdominal pain EXAM: ABDOMEN ULTRASOUND COMPLETE COMPARISON:  03/23/2016 FINDINGS: Gallbladder: Anechoic fluid within the gallbladder with no echogenic debris year stones. No gallbladder wall thickening. Negative sonographic Murphy's sign. Common bile duct: Diameter: 1 mm -2 mm Liver: No focal lesion identified. Within normal limits in parenchymal echogenicity. IVC: No abnormality visualized. Pancreas: Visualized portion unremarkable. Spleen: Size and appearance within normal limits. Right Kidney: Length: 12.5 cm. Echogenicity within normal limits. No mass or hydronephrosis visualized. Left  Kidney: Length: 12.8 cm. Echogenicity within normal limits. No mass or hydronephrosis visualized. Abdominal aorta: No aneurysm visualized. Other findings: None. IMPRESSION: Unremarkable sonographic survey with no sonographic evidence of acute cholecystitis. Electronically Signed   By: Gilmer MorJaime  Wagner D.O.   On: 08/11/2016 09:01       Today   Subjective:   Deanne CofferJeremy Beltran  Pt doing well  Objective:   Blood pressure 119/71, pulse (!) 59, temperature 97.5 F (36.4 C), temperature source Oral, resp. rate 16, height 5\' 11"  (1.803 m), weight 209 lb 1.6 oz (94.8 kg), SpO2 100 %.  .  Intake/Output Summary (Last 24 hours) at 08/11/16 1557 Last data filed at 08/11/16 0809  Gross per 24 hour  Intake             2270 ml  Output              400 ml  Net             1870 ml    Exam VITAL SIGNS: Blood pressure 119/71, pulse (!) 59, temperature 97.5 F (36.4 C), temperature source Oral, resp. rate 16, height 5\' 11"  (1.803 m), weight 209 lb 1.6 oz (94.8 kg), SpO2 100 %.  GENERAL:  31 y.o.-year-old patient lying in the bed with no acute distress.  EYES: Pupils equal, round, reactive to light and accommodation. No scleral icterus. Extraocular muscles intact.  HEENT: Head atraumatic, normocephalic. Oropharynx and nasopharynx clear.  NECK:  Supple, no jugular venous distention. No thyroid enlargement, no tenderness.  LUNGS: Normal breath sounds bilaterally, no wheezing, rales,rhonchi or crepitation. No use of accessory muscles of respiration.  CARDIOVASCULAR:  S1, S2 normal. No murmurs, rubs, or gallops.  ABDOMEN: Soft, nontender, nondistended. Bowel sounds present. No organomegaly or mass.  EXTREMITIES: No pedal edema, cyanosis, or clubbing.  NEUROLOGIC: Cranial nerves II through XII are intact. Muscle strength 5/5 in all extremities. Sensation intact. Gait not checked.  PSYCHIATRIC: The patient is alert and oriented x 3.  SKIN: No obvious rash, lesion, or ulcer.   Data Review     CBC w Diff: Lab  Results  Component Value Date   WBC 6.6 08/11/2016   HGB 12.6 (L) 08/11/2016   HCT 37.9 (L) 08/11/2016   PLT 177 08/11/2016   LYMPHOPCT 43 03/27/2016   MONOPCT 9 03/27/2016   EOSPCT 5 03/27/2016   BASOPCT 1 03/27/2016   CMP: Lab Results  Component Value Date   NA 137 08/11/2016   K 3.8 08/11/2016   CL 106 08/11/2016   CO2 28 08/11/2016   BUN 15 08/11/2016   CREATININE 1.31 (H) 08/11/2016   PROT 6.5 08/11/2016   ALBUMIN 3.9 08/11/2016   BILITOT 0.5 08/11/2016   ALKPHOS 51 08/11/2016   AST 40 08/11/2016   ALT 29 08/11/2016  .  Micro Results No results found for this or any previous visit (from the past 240 hour(s)).      Code Status Orders        Start     Ordered   08/11/16 0331  Full code  Continuous     08/11/16 0330    Code Status History    Date Active Date Inactive Code Status Order ID Comments User Context   This patient has a current code status but no historical code status.          Follow-up Information    pcp Follow up in 1 week(s).           Discharge Medications   Allergies as of 08/11/2016      Reactions   Antihistamines, Diphenhydramine-type    liver      Medication List    You have not been prescribed any medications.        Total Time in preparing paper work, data evaluation and todays exam - 35 minutes  Auburn Bilberry M.D on 08/11/2016 at 3:57 PM  [facility]   Office  [phone]

## 2016-08-11 NOTE — Progress Notes (Signed)
Discharge paperwork reviewed with patient who verbalized understanding. Pt to follow up with PCP. Patients friend to transport home. Patient is stable and ready for discharge. Patient requests to walk out of hospital himself. Stable gait visualized.

## 2016-08-11 NOTE — Progress Notes (Signed)
PT Cancellation Note  Patient Details Name: Benjamin Beltran MRN: [id number] DOB: 02-Feb-1986   Cancelled Treatment:    Reason Eval/Treat Not Completed:  (Consult received and chart reviewed.  Per discussion with primary RN, patient up in room without difficulty; no PT needs identified.  Will complete PT order at this time; please re-consult should needs change.)    Jahne Krukowski H. Manson PasseyBrown, PT, DPT, NCS 08/11/16, 9:23 AM [phone]

## 2016-08-12 LAB — HIV ANTIBODY (ROUTINE TESTING W REFLEX): HIV SCREEN 4TH GENERATION: NONREACTIVE

## 2016-11-29 ENCOUNTER — Emergency Department
Admission: EM | Admit: 2016-11-29 | Discharge: 2016-11-29 | Disposition: A | Discharge status: Home / Self Care | Payer: Self-pay | Attending: Emergency Medicine | Admitting: Emergency Medicine

## 2016-11-29 ENCOUNTER — Emergency Department: Payer: Self-pay

## 2016-11-29 ENCOUNTER — Encounter: Payer: Self-pay | Admitting: Emergency Medicine

## 2016-11-29 DIAGNOSIS — B349 Viral infection, unspecified: Secondary | ICD-10-CM | POA: Insufficient documentation

## 2016-11-29 DIAGNOSIS — K8500 Idiopathic acute pancreatitis without necrosis or infection: Secondary | ICD-10-CM | POA: Insufficient documentation

## 2016-11-29 DIAGNOSIS — J069 Acute upper respiratory infection, unspecified: Secondary | ICD-10-CM | POA: Insufficient documentation

## 2016-11-29 DIAGNOSIS — F17210 Nicotine dependence, cigarettes, uncomplicated: Secondary | ICD-10-CM | POA: Insufficient documentation

## 2016-11-29 LAB — CBC WITH DIFFERENTIAL/PLATELET
BASOS ABS: 0 10*3/uL (ref 0–0.1)
Basophils Relative: 1 %
EOS PCT: 7 %
Eosinophils Absolute: 0.3 10*3/uL (ref 0–0.7)
HCT: 40.8 % (ref 40.0–52.0)
Hemoglobin: 13.5 g/dL (ref 13.0–18.0)
LYMPHS ABS: 1.6 10*3/uL (ref 1.0–3.6)
LYMPHS PCT: 41 %
MCH: 28.4 pg (ref 26.0–34.0)
MCHC: 33.1 g/dL (ref 32.0–36.0)
MCV: 85.7 fL (ref 80.0–100.0)
MONO ABS: 0.3 10*3/uL (ref 0.2–1.0)
MONOS PCT: 9 %
Neutro Abs: 1.6 10*3/uL (ref 1.4–6.5)
Neutrophils Relative %: 42 %
PLATELETS: 174 10*3/uL (ref 150–440)
RBC: 4.77 MIL/uL (ref 4.40–5.90)
RDW: 13.8 % (ref 11.5–14.5)
WBC: 3.8 10*3/uL (ref 3.8–10.6)

## 2016-11-29 LAB — COMPREHENSIVE METABOLIC PANEL
ALT: 28 U/L (ref 17–63)
AST: 33 U/L (ref 15–41)
Albumin: 4.1 g/dL (ref 3.5–5.0)
Alkaline Phosphatase: 47 U/L (ref 38–126)
Anion gap: 6 (ref 5–15)
BUN: 9 mg/dL (ref 6–20)
CHLORIDE: 103 mmol/L (ref 101–111)
CO2: 30 mmol/L (ref 22–32)
Calcium: 8.8 mg/dL — ABNORMAL LOW (ref 8.9–10.3)
Creatinine, Ser: 1.07 mg/dL (ref 0.61–1.24)
Glucose, Bld: 106 mg/dL — ABNORMAL HIGH (ref 65–99)
POTASSIUM: 3.9 mmol/L (ref 3.5–5.1)
Sodium: 139 mmol/L (ref 135–145)
Total Bilirubin: 0.7 mg/dL (ref 0.3–1.2)
Total Protein: 7.2 g/dL (ref 6.5–8.1)

## 2016-11-29 LAB — LIPASE, BLOOD: Lipase: 83 U/L — ABNORMAL HIGH (ref 11–51)

## 2016-11-29 LAB — TROPONIN I

## 2016-11-29 LAB — ETHANOL

## 2016-11-29 MED ORDER — KETOROLAC TROMETHAMINE 30 MG/ML IJ SOLN
30.0000 mg | Freq: Once | INTRAMUSCULAR | Status: AC
  Administered 2016-11-29: 30 mg via INTRAVENOUS
  Filled 2016-11-29: qty 1

## 2016-11-29 MED ORDER — METOCLOPRAMIDE HCL 5 MG/ML IJ SOLN
10.0000 mg | Freq: Once | INTRAMUSCULAR | Status: AC
  Administered 2016-11-29: 10 mg via INTRAVENOUS
  Filled 2016-11-29: qty 2

## 2016-11-29 MED ORDER — LORAZEPAM 2 MG/ML IJ SOLN
1.0000 mg | Freq: Once | INTRAMUSCULAR | Status: AC
  Administered 2016-11-29: 1 mg via INTRAVENOUS
  Filled 2016-11-29: qty 1

## 2016-11-29 MED ORDER — ONDANSETRON 4 MG PO TBDP: 4.0000 mg | ORAL_TABLET | Freq: Three times a day (TID) | ORAL | 0 refills | Status: DC | PRN

## 2016-11-29 MED ORDER — OXYCODONE-ACETAMINOPHEN 5-325 MG PO TABS: 1.0000 | ORAL_TABLET | Freq: Four times a day (QID) | ORAL | 0 refills | Status: DC | PRN

## 2016-11-29 NOTE — ED Notes (Signed)
MD aware of patient's bradycardia.

## 2016-11-29 NOTE — ED Notes (Signed)
MD notified of patient's anxiety, tachycardia, and diaphoresis.  MD acknowledged and new orders given.

## 2016-11-29 NOTE — ED Notes (Signed)
Pt states he woke up this morning with nausea and a headache. States he also has a tooth that broke off in his mouth and is worried it is infected. Patient in NAD.

## 2016-11-29 NOTE — ED Notes (Signed)
MD in room to assess patient.  Will continue to monitor.   

## 2016-11-29 NOTE — ED Notes (Signed)
As this RN began placing EKG leads on patient, patient stated, he felt his heart was beating much faster and he felt very hot.  Patient states he cannot lie in the bed.  Patient standing up and pacing around room.  This RN discussed the importance of getting an EKG if patient felt his heart was beating fast.  Patient denies chest pain and shortness of breath at this time.  Patient reports feeling like he is having a panic attack and reports history of panic attacks.

## 2016-11-29 NOTE — ED Provider Notes (Addendum)
[facility] Emergency Department Provider Note       Time seen: ----------------------------------------- 7:21 AM on 11/29/2016 -----------------------------------------     I have reviewed the triage vital signs and the nursing notes.   HISTORY   Chief Complaint Headache and Cough    HPI Benjamin Beltran is a 31 y.o. male with a history of remote history of rhabdo resulting in renal failure who presents to the ED for right-sided headache as well as nonproductive cough. Patient describes a sensation that he has infection in his body. He woke up not feeling well, felt pain across his chest that he describes as indigestion. He has had a persistent cough but no productive nature. He also has a right-sided headache. He denies fevers, chills or other complaints.  Past Medical History:  Diagnosis Date  . Elevated liver function tests unk    Patient Active Problem List   Diagnosis Date Noted  . Acute renal failure due to rhabdomyolysis (HCC) 08/11/2016    Past Surgical History:  Procedure Laterality Date  . tendon surgery to left hand  unk    Allergies Antihistamines, diphenhydramine-type  Social History Social History  Substance Use Topics  . Smoking status: Current Every Day Smoker    Packs/day: 0.50    Types: Cigarettes  . Smokeless tobacco: Never Used  . Alcohol use Yes    Review of Systems Constitutional: Negative for fever. Eyes: Negative for vision changes ENT:  Negative for congestion, sore throat Cardiovascular: positive for chest pain, cough Respiratory: Negative for shortness of breath. Gastrointestinal: Negative for abdominal pain, vomiting and diarrhea. Genitourinary: Negative for dysuria. Musculoskeletal: Negative for back pain. Skin: Negative for rash. Neurological: positive for headache, weakness  All systems negative/normal/unremarkable except as stated in the  HPI  ____________________________________________   PHYSICAL EXAM:  VITAL SIGNS: ED Triage Vitals  Enc Vitals Group     BP 11/29/16 0614 103/61     Pulse Rate 11/29/16 0614 (!) 56     Resp 11/29/16 0614 18     Temp 11/29/16 0614 97.7 F (36.5 C)     Temp Source 11/29/16 0614 Oral     SpO2 11/29/16 0614 100 %     Weight 11/29/16 0612 215 lb (97.5 kg)     Height 11/29/16 0612  (1.803 m)     Head Circumference --      Peak Flow --      Pain Score 11/29/16 0612 8     Pain Loc --      Pain Edu? --      Excl. in GC? --    Constitutional: Alert and oriented. Well appearing and in no distress. Eyes: Conjunctivae are normal. Normal extraocular movements. ENT   Head: Normocephalic and atraumatic.   Nose: No congestion/rhinnorhea.   Mouth/Throat: Mucous membranes are moist.   Neck: No stridor. Cardiovascular: Normal rate, regular rhythm. No murmurs, rubs, or gallops. Respiratory: Normal respiratory effort without tachypnea nor retractions. Breath sounds are clear and equal bilaterally. No wheezes/rales/rhonchi. Gastrointestinal: Soft and nontender. Normal bowel sounds Musculoskeletal: Nontender with normal range of motion in extremities. No lower extremity tenderness nor edema. Neurologic:  Normal speech and language. No gross focal neurologic deficits are appreciated.  Skin:  Skin is warm, dry and intact. No rash noted. Psychiatric: Mood and affect are normal. Speech and behavior are normal.  ____________________________________________  ED COURSE:  Pertinent labs & imaging results that were available during my care of the patient were reviewed by me and  considered in my medical decision making (see chart for details). Patient presents for generalized malaise with headache and cough, we will assess with labs and imaging as indicated.  EKG interpreted by me with a normal sinus rhythm, rate is 90 bpm, LVH, normal PR interval, normal QT.    Procedures ____________________________________________   LABS (pertinent positives/negatives)  Labs Reviewed  COMPREHENSIVE METABOLIC PANEL - Abnormal; Notable for the following:       Result Value   Glucose, Bld 106 (*)    Calcium 8.8 (*)    All other components within normal limits  LIPASE, BLOOD - Abnormal; Notable for the following:    Lipase 83 (*)    All other components within normal limits  CBC WITH DIFFERENTIAL/PLATELET  TROPONIN I  ETHANOL  URINE DRUG SCREEN, QUALITATIVE ([facility] ONLY)    RADIOLOGY  chest x-ray is unremarkable ____________________________________________  DIFFERENTIAL DIAGNOSIS   URI, pneumonia, gastroenteritis, toothache, renal failure, rhabdomyolysis   FINAL ASSESSMENT AND PLAN  pancreatitis, anxiety   Plan: Patient had presented for general malaise with headache and cough. Patients labs were negative with the exception of an elevated lipase likely indicative viral or idiopathic pancreatitis. patient states he has not had any alcohol in the last month. Renal function and troponin were both negative. He has no leukocytosisor worrisome exam. Patients imaging was negative for pneumonia. be discharged with pain medicine and otherwise he is stable for outpatient follow-up.   Emily Filbert, MD   Note: This note was generated in part or whole with voice recognition software. Voice recognition is usually quite accurate but there are transcription errors that can and very often do occur. I apologize for any typographical errors that were not detected and corrected.     Emily Filbert, MD 11/29/16 1610    Emily Filbert, MD 11/29/16 9604    Emily Filbert, MD 11/29/16 [phone]

## 2016-11-29 NOTE — ED Triage Notes (Addendum)
Patient ambulatory to triage with steady gait, without difficulty or distress noted; pt reports recent nonprod cough; c/o right sided HA

## 2017-01-14 ENCOUNTER — Emergency Department: Payer: Self-pay

## 2017-01-14 ENCOUNTER — Emergency Department
Admission: EM | Admit: 2017-01-14 | Discharge: 2017-01-14 | Discharge status: Against Medical Advice | Payer: Self-pay | Attending: Emergency Medicine | Admitting: Emergency Medicine

## 2017-01-14 ENCOUNTER — Encounter: Payer: Self-pay | Admitting: Emergency Medicine

## 2017-01-14 DIAGNOSIS — R002 Palpitations: Secondary | ICD-10-CM | POA: Insufficient documentation

## 2017-01-14 DIAGNOSIS — Z5321 Procedure and treatment not carried out due to patient leaving prior to being seen by health care provider: Secondary | ICD-10-CM | POA: Insufficient documentation

## 2017-01-14 LAB — BASIC METABOLIC PANEL
ANION GAP: 7 (ref 5–15)
BUN: 13 mg/dL (ref 6–20)
CO2: 28 mmol/L (ref 22–32)
Calcium: 8.9 mg/dL (ref 8.9–10.3)
Chloride: 104 mmol/L (ref 101–111)
Creatinine, Ser: 0.99 mg/dL (ref 0.61–1.24)
GFR calc Af Amer: >60 mL/min (ref >60)
GFR calc non Af Amer: >60 mL/min (ref >60)
GLUCOSE: 96 mg/dL (ref 65–99)
POTASSIUM: 3.9 mmol/L (ref 3.5–5.1)
Sodium: 139 mmol/L (ref 135–145)

## 2017-01-14 LAB — CBC
HEMATOCRIT: 40.1 % (ref 40.0–52.0)
HEMOGLOBIN: 13.3 g/dL (ref 13.0–18.0)
MCH: 28.6 pg (ref 26.0–34.0)
MCHC: 33.3 g/dL (ref 32.0–36.0)
MCV: 85.9 fL (ref 80.0–100.0)
Platelets: 192 10*3/uL (ref 150–440)
RBC: 4.66 MIL/uL (ref 4.40–5.90)
RDW: 13.1 % (ref 11.5–14.5)
WBC: 4.2 10*3/uL (ref 3.8–10.6)

## 2017-01-14 LAB — TROPONIN I: Troponin I: <0.03 ng/mL (ref <0.03)

## 2017-01-14 NOTE — ED Triage Notes (Signed)
Patient with complaint of his "heart jumping". Patent states that it has been jumping all day.

## 2017-01-14 NOTE — ED Notes (Signed)
Lobby checked for pt, bathrooms checked, outside checked, no response.

## 2017-01-14 NOTE — ED Notes (Signed)
Pt called to be taken back to tx room, no response

## 2017-01-15 ENCOUNTER — Telehealth: Payer: Self-pay | Admitting: Emergency Medicine

## 2017-01-15 NOTE — Telephone Encounter (Signed)
Called patient due to lwot to inquire about condition and follow up plans. Person who answered said patient is not there, but she will give him the message that we called.  She says she thinks he may return today.

## 2017-02-09 LAB — HM HIV SCREENING LAB: HM HIV Screening: NEGATIVE

## 2017-06-22 ENCOUNTER — Encounter: Payer: Self-pay | Admitting: Emergency Medicine

## 2017-06-22 ENCOUNTER — Emergency Department
Admission: EM | Admit: 2017-06-22 | Discharge: 2017-06-22 | Disposition: A | Discharge status: Home / Self Care | Payer: Worker's Compensation | Attending: Emergency Medicine | Admitting: Emergency Medicine

## 2017-06-22 ENCOUNTER — Other Ambulatory Visit: Payer: Self-pay

## 2017-06-22 ENCOUNTER — Emergency Department: Payer: Worker's Compensation

## 2017-06-22 DIAGNOSIS — Y939 Activity, unspecified: Secondary | ICD-10-CM | POA: Insufficient documentation

## 2017-06-22 DIAGNOSIS — W230XXA Caught, crushed, jammed, or pinched between moving objects, initial encounter: Secondary | ICD-10-CM | POA: Diagnosis not present

## 2017-06-22 DIAGNOSIS — Z87891 Personal history of nicotine dependence: Secondary | ICD-10-CM | POA: Insufficient documentation

## 2017-06-22 DIAGNOSIS — Y9259 Other trade areas as the place of occurrence of the external cause: Secondary | ICD-10-CM | POA: Diagnosis not present

## 2017-06-22 DIAGNOSIS — S6992XA Unspecified injury of left wrist, hand and finger(s), initial encounter: Secondary | ICD-10-CM | POA: Diagnosis present

## 2017-06-22 DIAGNOSIS — Y990 Civilian activity done for income or pay: Secondary | ICD-10-CM | POA: Diagnosis not present

## 2017-06-22 DIAGNOSIS — Z79899 Other long term (current) drug therapy: Secondary | ICD-10-CM | POA: Insufficient documentation

## 2017-06-22 DIAGNOSIS — S67195A Crushing injury of left ring finger, initial encounter: Secondary | ICD-10-CM | POA: Diagnosis not present

## 2017-06-22 MED ORDER — TRAMADOL HCL 50 MG PO TABS: 50.0000 mg | ORAL_TABLET | Freq: Four times a day (QID) | ORAL | 0 refills | Status: DC | PRN

## 2017-06-22 MED ORDER — NAPROXEN 500 MG PO TABS: 500.0000 mg | ORAL_TABLET | Freq: Two times a day (BID) | ORAL | 0 refills | Status: DC

## 2017-06-22 NOTE — ED Triage Notes (Signed)
Pt arrives ambulatory to triage with c/o left ring finger injury at work. Pt reports that he was operating the press which was broken and as he was pushing down on the lever, he felt a "pop". Pt does want WC. Pt is in NAD.

## 2017-06-22 NOTE — ED Notes (Signed)
This EDT,and RN Janus Molder (first nurse) verified that we do not have the pt's company's profile in our system records for Workers Comp. Pt was informed that he would now need to fill out a Ineligibility Form which states that based on the reasons below we are unable to process his workers comp. Paper work at this time.   Reasons as to why [facility] was not able to do claim: 1. Pt job profile not found in our system 2. Pt job has not specified we may use our [facility] COC as a back up 3. Pt job has not specified which drug panel to use for the lab testing  Pt was advised to contact his supervisor/ HR immediately regarding this matter. Pt also informed that he has 72hrs from the time of the incidence/accident to file workers comp. Pt filled out paper work mentioned above and was given a carbon copy to take with him back to work. Pt informed that we will still be treating him as a patient for his finger injury. Pt verbalized understanding of information given to him and is now sitting in the lobby awaiting further treatment. Triage nurse notified

## 2017-06-22 NOTE — ED Notes (Signed)
Splint applied to left ring finger, CMS intact prior to and after application

## 2017-06-22 NOTE — ED Notes (Signed)
Pt given instructions for WC urine specimen which included to not flush the toilet or wash hands before placing specimen with this RN. EDT Sarah heard pt running water in sink which pt stated that he had forgotten and was washing his hands. Pt informed that in order to do WC, pt must urinate. Pt reports that he cannot urinate again at this time and made aware that he has a two hour window. Pt verbalized understanding and will notify nursing staff when he can urinate again. Pt seated in lobby at this time.

## 2017-06-22 NOTE — ED Provider Notes (Signed)
[facility] Emergency Department Provider Note ____________________________________________  Time seen: Approximately 11:04 PM  I have reviewed the triage vital signs and the nursing notes.   HISTORY  Chief Complaint Finger Injury    HPI Benjamin Beltran is a 32 y.o. male who presents to the emergency department for evaluation and treatment of left ring finger pain. While at work he smashed it in a press. No alleviating measures have been attempted for this complaint prior to arrival.   Past Medical History:  Diagnosis Date  . Elevated liver function tests unk    Patient Active Problem List   Diagnosis Date Noted  . Acute renal failure due to rhabdomyolysis (HCC) 08/11/2016    Past Surgical History:  Procedure Laterality Date  . NECK SURGERY    . tendon surgery to left hand  unk    Prior to Admission medications   Medication Sig Start Date End Date Taking? Authorizing Provider  naproxen (NAPROSYN) 500 MG tablet Take 1 tablet (500 mg total) by mouth 2 (two) times daily with a meal. 06/22/17   Erasto Sleight B, FNP  ondansetron (ZOFRAN ODT) 4 MG disintegrating tablet Take 1 tablet (4 mg total) by mouth every 8 (eight) hours as needed for nausea or vomiting. 11/29/16   Emily Filbert, MD  oxyCODONE-acetaminophen (PERCOCET) 5-325 MG tablet Take 1-2 tablets by mouth every 6 (six) hours as needed. 11/29/16   Emily Filbert, MD  traMADol (ULTRAM) 50 MG tablet Take 1 tablet (50 mg total) by mouth every 6 (six) hours as needed. 06/22/17   Bibiana Gillean, Rulon Eisenmenger B, FNP    Allergies Antihistamines, diphenhydramine-type  No family history on file.  Social History Social History   Tobacco Use  . Smoking status: Former Smoker    Packs/day: 0.50    Types: Cigarettes  . Smokeless tobacco: Never Used  Substance Use Topics  . Alcohol use: No    Frequency: Never  . Drug use: Yes    Types: Marijuana    Review of Systems Constitutional: Negative for  fever. Cardiovascular: Negative for chest pain. Respiratory: Negative for shortness of breath. Musculoskeletal: Positive for left hand pain. Skin: Negative for open wound or lesion.  Neurological: Negative for decrease in sensation  ____________________________________________   PHYSICAL EXAM:  VITAL SIGNS: ED Triage Vitals  Enc Vitals Group     BP 06/22/17 2211 118/64     Pulse Rate 06/22/17 2211 77     Resp 06/22/17 2211 18     Temp 06/22/17 2211 (!) 97.5 F (36.4 C)     Temp Source 06/22/17 2211 Oral     SpO2 06/22/17 2211 98 %     Weight 06/22/17 2219 211 lb (95.7 kg)     Height 06/22/17 2219  (1.803 m)     Head Circumference --      Peak Flow --      Pain Score 06/22/17 2219 8     Pain Loc --      Pain Edu? --      Excl. in GC? --     Constitutional: Alert and oriented. Well appearing and in no acute distress. Eyes: Conjunctivae are clear without discharge or drainage Head: Atraumatic Neck: Supple Respiratory: No cough. Respirations are even and unlabored. Musculoskeletal: Patient is unable to fully extend at the left ring finger DIP. Neurologic: Sensation and motor function is intact. Skin: Intact Psychiatric: Affect and behavior are appropriate.  ____________________________________________   LABS (all labs ordered are listed, but  only abnormal results are displayed)  Labs Reviewed - No data to display ____________________________________________  RADIOLOGY  Image of the left hand is negative for acute bony abnormality per radiology. ____________________________________________   PROCEDURES  Procedures  ____________________________________________   INITIAL IMPRESSION / ASSESSMENT AND PLAN / ED COURSE  Benjamin Beltran is a 32 y.o. who presents to the emergency department for evaluation and treatment after smashing his left ring finger in a press while at work.  Differential diagnosis includes but is not limited to crush injury, fracture,  or tendon injury.  Patient instructed to follow-up with the hand specialist.  He was also instructed to return to the emergency department for symptoms that change or worsen if unable schedule an appointment with orthopedics or primary care.  Medications - No data to display  Pertinent labs & imaging results that were available during my care of the patient were reviewed by me and considered in my medical decision making (see chart for details).  _________________________________________   FINAL CLINICAL IMPRESSION(S) / ED DIAGNOSES  Final diagnoses:  Crushing injury of left ring finger, initial encounter    ED Discharge Orders        Ordered    naproxen (NAPROSYN) 500 MG tablet  2 times daily with meals     06/22/17 2315    traMADol (ULTRAM) 50 MG tablet  Every 6 hours PRN     06/22/17 2315       If controlled substance prescribed during this visit, 12 month history viewed on the [facility] prior to issuing an initial prescription for Schedule II or III opiod.    Chinita Pester, FNP 06/22/17 2332    Emily Filbert, MD 06/23/17 [phone]

## 2017-06-22 NOTE — ED Notes (Signed)
Pt reports pain to left ring finger after operating a press at work, pt reports squeezing injury at distal knuckle, apparent trauma with some swelling to posterior side, pt reports unable to bend finger

## 2017-06-24 ENCOUNTER — Emergency Department
Admission: EM | Admit: 2017-06-24 | Discharge: 2017-06-24 | Disposition: A | Discharge status: Home / Self Care | Payer: Self-pay | Attending: Emergency Medicine | Admitting: Emergency Medicine

## 2017-06-24 ENCOUNTER — Encounter: Payer: Self-pay | Admitting: Emergency Medicine

## 2017-06-24 ENCOUNTER — Other Ambulatory Visit: Payer: Self-pay

## 2017-06-24 DIAGNOSIS — Z87891 Personal history of nicotine dependence: Secondary | ICD-10-CM | POA: Insufficient documentation

## 2017-06-24 DIAGNOSIS — Y929 Unspecified place or not applicable: Secondary | ICD-10-CM | POA: Insufficient documentation

## 2017-06-24 DIAGNOSIS — S30860A Insect bite (nonvenomous) of lower back and pelvis, initial encounter: Secondary | ICD-10-CM | POA: Insufficient documentation

## 2017-06-24 DIAGNOSIS — Y9389 Activity, other specified: Secondary | ICD-10-CM | POA: Insufficient documentation

## 2017-06-24 DIAGNOSIS — W57XXXA Bitten or stung by nonvenomous insect and other nonvenomous arthropods, initial encounter: Secondary | ICD-10-CM | POA: Insufficient documentation

## 2017-06-24 DIAGNOSIS — Y998 Other external cause status: Secondary | ICD-10-CM | POA: Insufficient documentation

## 2017-06-24 MED ORDER — DOXYCYCLINE HYCLATE 100 MG PO CAPS: ORAL_CAPSULE | ORAL | 0 refills | Status: DC

## 2017-06-24 NOTE — ED Triage Notes (Signed)
Here for tick removal to right groin.  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 Reports it is deep.  No pain. No fevers.

## 2017-06-24 NOTE — ED Notes (Signed)
See triage note  presents with possible tick embedded in groin area   States he removed one to back several days ago  But was not aware of this tick  Denies any fever or headache

## 2017-06-24 NOTE — ED Provider Notes (Signed)
[facility] Emergency Department Provider Note  ____________________________________________   First MD Initiated Contact with Patient 06/24/17 [phone]     (approximate)  I have reviewed the triage vital signs and the nursing notes.   HISTORY  Chief Complaint Tick Removal   HPI Benjamin Beltran is a 32 y.o. male is here for tick removal of the right genital area.  Patient thinks it is possible that the tick is been there for a week.  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  Patient reports no pain, fever, joint aches or headache.   Past Medical History:  Diagnosis Date  . Elevated liver function tests unk    Patient Active Problem List   Diagnosis Date Noted  . Acute renal failure due to rhabdomyolysis (HCC) 08/11/2016    Past Surgical History:  Procedure Laterality Date  . NECK SURGERY    . tendon surgery to left hand  unk    Prior to Admission medications   Medication Sig Start Date End Date Taking? Authorizing Provider  doxycycline (VIBRAMYCIN) 100 MG capsule 2 capsule as a single dose once 06/24/17   Bridget Hartshorn L, PA-C    Allergies Antihistamines, diphenhydramine-type  History reviewed. No pertinent family history.  Social History Social History   Tobacco Use  . Smoking status: Former Smoker    Packs/day: 0.50    Types: Cigarettes  . Smokeless tobacco: Never Used  Substance Use Topics  . Alcohol use: No    Frequency: Never  . Drug use: Yes    Types: Marijuana    Review of Systems Constitutional: No fever/chills Cardiovascular: Denies chest pain. Respiratory: Denies shortness of breath. Gastrointestinal:  No nausea, no vomiting.   Musculoskeletal: Negative for muscle or joint aches. Skin: Positive for tick  bite. Neurological: Negative for headaches, focal weakness or numbness. ___________________________________________   PHYSICAL EXAM:  VITAL SIGNS: ED Triage Vitals  Enc Vitals Group     BP  06/24/17 0853 105/65     Pulse Rate 06/24/17 0853 (!) 58     Resp 06/24/17 0853 16     Temp 06/24/17 0853 97.6 F (36.4 C)     Temp Source 06/24/17 0853 Oral     SpO2 06/24/17 0853 99 %     Weight 06/24/17 0843 211 lb (95.7 kg)     Height 06/24/17 0843  (1.803 m)     Head Circumference --      Peak Flow --      Pain Score 06/24/17 0843 0     Pain Loc --      Pain Edu? --      Excl. in [place]? --     Constitutional: Alert and oriented. Well appearing and in no acute distress. Eyes: Conjunctivae are normal.  Head: Atraumatic. Neck: No stridor.   Cardiovascular: Normal rate, regular rhythm. Grossly normal heart sounds.  Good peripheral circulation. Respiratory: Normal respiratory effort.  No retractions. Lungs CTAB. Gastrointestinal: Soft and nontender. No distention. No abdominal bruits. No CVA tenderness. Musculoskeletal: Moves upper and lower extremities without any difficulty.  Normal gait was noted. Neurologic:  Normal speech and language. No gross focal neurologic deficits are appreciated.  Skin:  Skin is warm, dry and intact.  Tick is present at the right lateral base of the penis. Psychiatric: Mood and affect are normal. Speech and behavior are normal.  ____________________________________________   LABS (all labs ordered are listed, but only abnormal results are displayed)  Labs Reviewed - No  data to display  PROCEDURES  Procedure(s) performed: Tick was removed including the head using a hemostat.  No particles were present after this procedure.  Procedures  Critical Care performed: No  ____________________________________________   INITIAL IMPRESSION / ASSESSMENT AND PLAN / ED COURSE  As part of my medical decision making, I reviewed the following data within the electronic MEDICAL RECORD NUMBER Notes from prior ED visits and [place] Controlled Substance Database  Patient is here for tick removal of a tick that could possibly have been on him for 1 week.  Patient was  given a one-time dose of doxycycline as recommended by the up-to-date site for prophylaxis.  Patient is to follow-up with his PCP if any continued problems or [facility] acute care.  ____________________________________________   FINAL CLINICAL IMPRESSION(S) / ED DIAGNOSES  Final diagnoses:  Tick bite with subsequent removal of tick     ED Discharge Orders        Ordered    doxycycline (VIBRAMYCIN) 100 MG capsule     06/24/17 1034       Note:  This document was prepared using Dragon voice recognition software and may include unintentional dictation errors.    Tommi Rumps, PA-C 06/24/17 1618    Jene Every, MD 06/26/17 2146

## 2017-06-24 NOTE — Discharge Instructions (Signed)
Follow-up with any urgent care or [facility] acute care if any continued problems.  Take doxycycline 2 capsules as a single dose once.  Watch area for any signs of infection.

## 2017-10-31 ENCOUNTER — Emergency Department
Admission: EM | Admit: 2017-10-31 | Discharge: 2017-10-31 | Disposition: A | Discharge status: Home / Self Care | Payer: Self-pay | Attending: Emergency Medicine | Admitting: Emergency Medicine

## 2017-10-31 ENCOUNTER — Encounter: Payer: Self-pay | Admitting: Emergency Medicine

## 2017-10-31 DIAGNOSIS — Z5321 Procedure and treatment not carried out due to patient leaving prior to being seen by health care provider: Secondary | ICD-10-CM | POA: Insufficient documentation

## 2017-10-31 DIAGNOSIS — R110 Nausea: Secondary | ICD-10-CM | POA: Insufficient documentation

## 2017-10-31 DIAGNOSIS — R51 Headache: Secondary | ICD-10-CM | POA: Insufficient documentation

## 2017-10-31 LAB — COMPREHENSIVE METABOLIC PANEL
ALK PHOS: 52 U/L (ref 38–126)
ALT: 24 U/L (ref 0–44)
AST: 35 U/L (ref 15–41)
Albumin: 5.1 g/dL — ABNORMAL HIGH (ref 3.5–5.0)
Anion gap: 8 (ref 5–15)
BUN: 10 mg/dL (ref 6–20)
CALCIUM: 9.8 mg/dL (ref 8.9–10.3)
CHLORIDE: 100 mmol/L (ref 98–111)
CO2: 28 mmol/L (ref 22–32)
CREATININE: 1.77 mg/dL — AB (ref 0.61–1.24)
GFR calc Af Amer: 57 mL/min — ABNORMAL LOW (ref >60)
GFR calc non Af Amer: 49 mL/min — ABNORMAL LOW (ref >60)
Glucose, Bld: 128 mg/dL — ABNORMAL HIGH (ref 70–99)
Potassium: 3.6 mmol/L (ref 3.5–5.1)
SODIUM: 136 mmol/L (ref 135–145)
Total Bilirubin: 0.8 mg/dL (ref 0.3–1.2)
Total Protein: 8.2 g/dL — ABNORMAL HIGH (ref 6.5–8.1)

## 2017-10-31 LAB — CBC
HCT: 44 % (ref 40.0–52.0)
Hemoglobin: 14.6 g/dL (ref 13.0–18.0)
MCH: 28.3 pg (ref 26.0–34.0)
MCHC: 33.3 g/dL (ref 32.0–36.0)
MCV: 85 fL (ref 80.0–100.0)
PLATELETS: 221 10*3/uL (ref 150–440)
RBC: 5.18 MIL/uL (ref 4.40–5.90)
RDW: 13.2 % (ref 11.5–14.5)
WBC: 6.2 10*3/uL (ref 3.8–10.6)

## 2017-10-31 LAB — LIPASE, BLOOD: LIPASE: 36 U/L (ref 11–51)

## 2017-10-31 MED ORDER — ONDANSETRON 4 MG PO TBDP
4.0000 mg | ORAL_TABLET | Freq: Once | ORAL | Status: AC
  Administered 2017-10-31: 4 mg via ORAL

## 2017-10-31 MED ORDER — ONDANSETRON 4 MG PO TBDP
ORAL_TABLET | ORAL | Status: AC
  Filled 2017-10-31: qty 1

## 2017-10-31 NOTE — ED Notes (Signed)
No answer when called several times from lobby 
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
## 2017-11-01 ENCOUNTER — Telehealth: Payer: Self-pay | Admitting: Emergency Medicine

## 2017-11-01 NOTE — Telephone Encounter (Signed)
Called patient due to lwot to inquire about condition and follow up plans. Left message.   

## 2018-08-05 ENCOUNTER — Other Ambulatory Visit: Payer: Self-pay

## 2018-08-05 ENCOUNTER — Emergency Department
Admission: EM | Admit: 2018-08-05 | Discharge: 2018-08-05 | Disposition: A | Discharge status: Home / Self Care | Payer: Self-pay | Attending: Emergency Medicine | Admitting: Emergency Medicine

## 2018-08-05 ENCOUNTER — Encounter: Payer: Self-pay | Admitting: Emergency Medicine

## 2018-08-05 ENCOUNTER — Emergency Department: Payer: Self-pay

## 2018-08-05 DIAGNOSIS — Y939 Activity, unspecified: Secondary | ICD-10-CM | POA: Insufficient documentation

## 2018-08-05 DIAGNOSIS — W57XXXA Bitten or stung by nonvenomous insect and other nonvenomous arthropods, initial encounter: Secondary | ICD-10-CM | POA: Insufficient documentation

## 2018-08-05 DIAGNOSIS — S30861A Insect bite (nonvenomous) of abdominal wall, initial encounter: Secondary | ICD-10-CM | POA: Insufficient documentation

## 2018-08-05 DIAGNOSIS — M6282 Rhabdomyolysis: Secondary | ICD-10-CM | POA: Insufficient documentation

## 2018-08-05 DIAGNOSIS — Y929 Unspecified place or not applicable: Secondary | ICD-10-CM | POA: Insufficient documentation

## 2018-08-05 DIAGNOSIS — Z87891 Personal history of nicotine dependence: Secondary | ICD-10-CM | POA: Insufficient documentation

## 2018-08-05 DIAGNOSIS — Y999 Unspecified external cause status: Secondary | ICD-10-CM | POA: Insufficient documentation

## 2018-08-05 LAB — CBC WITH DIFFERENTIAL/PLATELET
Abs Immature Granulocytes: 0 10*3/uL (ref 0.00–0.07)
Basophils Absolute: 0 10*3/uL (ref 0.0–0.1)
Basophils Relative: 0 %
Eosinophils Absolute: 0.2 10*3/uL (ref 0.0–0.5)
Eosinophils Relative: 5 %
HCT: 37.4 % — ABNORMAL LOW (ref 39.0–52.0)
Hemoglobin: 12.2 g/dL — ABNORMAL LOW (ref 13.0–17.0)
Immature Granulocytes: 0 %
Lymphocytes Relative: 55 %
Lymphs Abs: 2.3 10*3/uL (ref 0.7–4.0)
MCH: 28 pg (ref 26.0–34.0)
MCHC: 32.6 g/dL (ref 30.0–36.0)
MCV: 85.8 fL (ref 80.0–100.0)
Monocytes Absolute: 0.3 10*3/uL (ref 0.1–1.0)
Monocytes Relative: 8 %
Neutro Abs: 1.3 10*3/uL — ABNORMAL LOW (ref 1.7–7.7)
Neutrophils Relative %: 32 %
Platelets: 209 10*3/uL (ref 150–400)
RBC: 4.36 MIL/uL (ref 4.22–5.81)
RDW: 13.3 % (ref 11.5–15.5)
WBC: 4.2 10*3/uL (ref 4.0–10.5)
nRBC: 0 % (ref 0.0–0.2)

## 2018-08-05 LAB — URINALYSIS, COMPLETE (UACMP) WITH MICROSCOPIC
Bilirubin Urine: NEGATIVE
Glucose, UA: NEGATIVE mg/dL
Hgb urine dipstick: NEGATIVE
Ketones, ur: NEGATIVE mg/dL
Leukocytes,Ua: NEGATIVE
Nitrite: NEGATIVE
Protein, ur: NEGATIVE mg/dL
Specific Gravity, Urine: 1.027 (ref 1.005–1.030)
Squamous Epithelial / HPF: NONE SEEN (ref 0–5)
pH: 7 (ref 5.0–8.0)

## 2018-08-05 LAB — CK
Total CK: 1049 U/L — ABNORMAL HIGH (ref 49–397)
Total CK: 873 U/L — ABNORMAL HIGH (ref 49–397)

## 2018-08-05 LAB — COMPREHENSIVE METABOLIC PANEL
ALT: 35 U/L (ref 0–44)
AST: 41 U/L (ref 15–41)
Albumin: 3.9 g/dL (ref 3.5–5.0)
Alkaline Phosphatase: 52 U/L (ref 38–126)
Anion gap: 9 (ref 5–15)
BUN: 10 mg/dL (ref 6–20)
CO2: 29 mmol/L (ref 22–32)
Calcium: 8.9 mg/dL (ref 8.9–10.3)
Chloride: 103 mmol/L (ref 98–111)
Creatinine, Ser: 0.99 mg/dL (ref 0.61–1.24)
GFR calc Af Amer: >60 mL/min (ref >60)
GFR calc non Af Amer: >60 mL/min (ref >60)
Glucose, Bld: 114 mg/dL — ABNORMAL HIGH (ref 70–99)
Potassium: 3.7 mmol/L (ref 3.5–5.1)
Sodium: 141 mmol/L (ref 135–145)
Total Bilirubin: 0.4 mg/dL (ref 0.3–1.2)
Total Protein: 6.4 g/dL — ABNORMAL LOW (ref 6.5–8.1)

## 2018-08-05 LAB — LIPASE, BLOOD: Lipase: 36 U/L (ref 11–51)

## 2018-08-05 MED ORDER — SODIUM CHLORIDE 0.9 % IV BOLUS
1000.0000 mL | Freq: Once | INTRAVENOUS | Status: AC
  Administered 2018-08-05: 16:00:00 1000 mL via INTRAVENOUS

## 2018-08-05 MED ORDER — SODIUM CHLORIDE 0.9 % IV BOLUS
1000.0000 mL | Freq: Once | INTRAVENOUS | Status: AC
  Administered 2018-08-05: 19:00:00 1000 mL via INTRAVENOUS

## 2018-08-05 MED ORDER — IOHEXOL 300 MG/ML  SOLN
100.0000 mL | Freq: Once | INTRAMUSCULAR | Status: AC | PRN
  Administered 2018-08-05: 100 mL via INTRAVENOUS
  Filled 2018-08-05: qty 100

## 2018-08-05 MED ORDER — SODIUM CHLORIDE 0.9 % IV BOLUS
1000.0000 mL | Freq: Once | INTRAVENOUS | Status: AC
  Administered 2018-08-05: 1000 mL via INTRAVENOUS

## 2018-08-05 MED ORDER — DOXYCYCLINE MONOHYDRATE 100 MG PO TABS: 100.0000 mg | ORAL_TABLET | Freq: Two times a day (BID) | ORAL | 0 refills | Status: AC

## 2018-08-05 NOTE — ED Provider Notes (Signed)
[facility] Emergency Department Provider Note  ____________________________________________  Time seen: Approximately 4:11 PM  I have reviewed the triage vital signs and the nursing notes.   HISTORY  Chief Complaint Insect Bite    HPI Benjamin Beltran is a 33 y.o. male with a history of alcohol dependency and nontraumatic rhabdo, presents to the emergency department with multiple concerns.  Patient has an embedded tick at left lower quadrant of abdomen.  Patient secondarily is concerned about dehydration.  Patient states that he has had some diffuse abdominal discomfort over the past few days which has been associated with non traumatic rhabdo in the past.  He currently feels like his symptoms are similar.  He denies chest pain, chest tightness, shortness of breath or nausea.  No constipation no emesis or diarrhea.  No other alleviating measures have been attempted.        Past Medical History:  Diagnosis Date  . Elevated liver function tests unk    Patient Active Problem List   Diagnosis Date Noted  . Acute renal failure due to rhabdomyolysis ([place]) 08/11/2016    Past Surgical History:  Procedure Laterality Date  . NECK SURGERY    . tendon surgery to left hand  unk    Prior to Admission medications   Medication Sig Start Date End Date Taking? Authorizing Provider  doxycycline (ADOXA) 100 MG tablet Take 1 tablet (100 mg total) by mouth 2 (two) times daily for 7 days. 08/05/18 08/12/18  Lannie Fields, PA-C    Allergies Antihistamines, diphenhydramine-type  No family history on file.  Social History Social History   Tobacco Use  . Smoking status: Former Smoker    Packs/day: 0.50    Types: Cigarettes  . Smokeless tobacco: Never Used  Substance Use Topics  . Alcohol use: No    Frequency: Never  . Drug use: Yes    Types: Marijuana     Review of Systems  Constitutional: No fever/chills Eyes: No visual changes. No discharge ENT: No  upper respiratory complaints. Cardiovascular: no chest pain. Respiratory: no cough. No SOB. Gastrointestinal: Patient has diffuse abdominal discomfort. No nausea, no vomiting.  No diarrhea.  No constipation. Genitourinary: Negative for dysuria. No hematuria Musculoskeletal: Negative for musculoskeletal pain. Skin: Patient has tick bite.  Neurological: Negative for headaches, focal weakness or numbness.   ____________________________________________   PHYSICAL EXAM:  VITAL SIGNS: ED Triage Vitals  Enc Vitals Group     BP 08/05/18 1502 135/75     Pulse Rate 08/05/18 1502 (!) 57     Resp 08/05/18 1502 20     Temp 08/05/18 1502 98.2 F (36.8 C)     Temp Source 08/05/18 1502 Oral     SpO2 08/05/18 1502 98 %     Weight 08/05/18 1458 210 lb (95.3 kg)     Height 08/05/18 1458 5\' 11"  (1.803 m)     Head Circumference --      Peak Flow --      Pain Score 08/05/18 1458 0     Pain Loc --      Pain Edu? --      Excl. in [place]? --      Constitutional: Alert and oriented. Well appearing and in no acute distress. Eyes: Conjunctivae are normal. PERRL. EOMI. Head: Atraumatic. Cardiovascular: Normal rate, regular rhythm. Normal S1 and S2.  Good peripheral circulation. Respiratory: Normal respiratory effort without tachypnea or retractions. Lungs CTAB. Good air entry to the bases with no decreased or  absent breath sounds. Gastrointestinal: Bowel sounds 4 quadrants. Soft and nontender to palpation. No guarding or rigidity. No palpable masses. No distention. No CVA tenderness. Musculoskeletal: Full range of motion to all extremities. No gross deformities appreciated. Neurologic:  Normal speech and language. No gross focal neurologic deficits are appreciated.  Skin: Patient had tick head embedded at left lower quadrant of abdomen.  Psychiatric: Mood and affect are normal. Speech and behavior are normal. Patient exhibits appropriate insight and  judgement.   ____________________________________________   LABS (all labs ordered are listed, but only abnormal results are displayed)  Labs Reviewed  CBC WITH DIFFERENTIAL/PLATELET - Abnormal; Notable for the following components:      Result Value   Hemoglobin 12.2 (*)    HCT 37.4 (*)    Neutro Abs 1.3 (*)    All other components within normal limits  COMPREHENSIVE METABOLIC PANEL - Abnormal; Notable for the following components:   Glucose, Bld 114 (*)    Total Protein 6.4 (*)    All other components within normal limits  CK - Abnormal; Notable for the following components:   Total CK 1,049 (*)    All other components within normal limits  URINALYSIS, COMPLETE (UACMP) WITH MICROSCOPIC - Abnormal; Notable for the following components:   Color, Urine YELLOW (*)    APPearance HAZY (*)    Bacteria, UA RARE (*)    All other components within normal limits  CK - Abnormal; Notable for the following components:   Total CK 873 (*)    All other components within normal limits  LIPASE, BLOOD   ____________________________________________  EKG   ____________________________________________  RADIOLOGY   Ct Abdomen Pelvis W Contrast  Result Date: 08/05/2018 CLINICAL DATA:  Right-sided abdominal pain EXAM: CT ABDOMEN AND PELVIS WITH CONTRAST TECHNIQUE: Multidetector CT imaging of the abdomen and pelvis was performed using the standard protocol following bolus administration of intravenous contrast. CONTRAST:  100mL OMNIPAQUE IOHEXOL 300 MG/ML  SOLN COMPARISON:  03/27/2016 FINDINGS: Lower chest: No acute abnormality. Hepatobiliary: No solid liver abnormality is seen. Contracted gallbladder. No gallstones, gallbladder wall thickening, or biliary dilatation. Pancreas: Unremarkable. No pancreatic ductal dilatation or surrounding inflammatory changes. Spleen: Normal in size without significant abnormality. Adrenals/Urinary Tract: Adrenal glands are unremarkable. Kidneys are normal, without  renal calculi, solid lesion, or hydronephrosis. Bladder is unremarkable. Stomach/Bowel: Stomach is within normal limits. Appendix appears normal. No evidence of bowel wall thickening, distention, or inflammatory changes. Vascular/Lymphatic: No significant vascular findings are present. No enlarged abdominal or pelvic lymph nodes. Reproductive: No mass or other significant abnormality. Other: No abdominal wall hernia or abnormality. No abdominopelvic ascites. Musculoskeletal: No acute or significant osseous findings. IMPRESSION: 1. No CT findings of the abdomen or pelvis to explain right-sided abdominal pain. 2. Contracted gallbladder without evidence of calculi, biliary ductal dilatation, or other abnormality. Electronically Signed   By: Lauralyn PrimesAlex  Bibbey M.D.   On: 08/05/2018 16:54    ____________________________________________    PROCEDURES  Procedure(s) performed:    Procedures  Tick head was removed using forceps  Medications  sodium chloride 0.9 % bolus 1,000 mL (0 mLs Intravenous Stopped 08/05/18 1713)  iohexol (OMNIPAQUE) 300 MG/ML solution 100 mL (100 mLs Intravenous Contrast Given 08/05/18 1640)  sodium chloride 0.9 % bolus 1,000 mL (0 mLs Intravenous Stopped 08/05/18 1915)  sodium chloride 0.9 % bolus 1,000 mL (0 mLs Intravenous Stopped 08/05/18 2050)     ____________________________________________   INITIAL IMPRESSION / ASSESSMENT AND PLAN / ED COURSE  Pertinent labs & imaging  results that were available during my care of the patient were reviewed by me and considered in my medical decision making (see chart for details).  Review of the [place] CSRS was performed in accordance of the [facility] prior to dispensing any controlled drugs.  Clinical Course as of Aug 04 2328  Mon Aug 05, 2018  2031 HCT(!): 37.4 [JW]    Clinical Course User Index [JW] Orvil FeilWoods, Benton Tooker M, [place]PA-C          Assessment and Plan:  Abdominal Discomfort:  Tick Bite:  33 year old male presents to the emergency  department with concern for diffuse abdominal discomfort that has occurred for the past 2 days and also an embedded tick head at left lower quadrant of abdomen.  On physical exam, patient seemed to be resting comfortably.  He was mildly bradycardic but vital signs were otherwise reassuring.  Patient had no abdominal tenderness or guarding appreciated to palpation on physical exam.  Differential diagnosis included nontraumatic rhabdo, pancreatitis, tick bite and dehydration...  Patient CK was elevated in the emergency department.  CT abdomen and pelvis revealed no acute abnormality.  Patient was given 2 L of normal saline and CK was reevaluated.  CK trended down in the emergency department.  Patient stated that he felt much better after receiving fluids and wished to be discharged from the emergency department.  Advised monitoring symptoms at home.  Strict return precautions were given to return to the emergency department for new or worsening symptoms.  All patient questions were answered    ____________________________________________  FINAL CLINICAL IMPRESSION(S) / ED DIAGNOSES  Final diagnoses:  Non-traumatic rhabdomyolysis  Tick bite, initial encounter      NEW MEDICATIONS STARTED DURING THIS VISIT:  ED Discharge Orders         Ordered    doxycycline (ADOXA) 100 MG tablet  2 times daily     08/05/18 2049              This chart was dictated using voice recognition software/Dragon. Despite best efforts to proofread, errors can occur which can change the meaning. Any change was purely unintentional.    Orvil FeilWoods, Nioma Mccubbins M, PA-C 08/05/18 2330    Minna AntisPaduchowski, Kevin, MD 08/06/18 2351

## 2018-08-05 NOTE — ED Notes (Signed)
See triage note  Presents with tick embedded at belt line  Also has been feeling dizzy  Thinks he may be dehydrated   No fever or n/v/d

## 2018-08-05 NOTE — ED Triage Notes (Signed)
Pt reports noticed a tick on his left side 1-2 hours ago and when he pulled it off the head stayed stuck inside.

## 2019-02-14 ENCOUNTER — Emergency Department
Admission: EM | Admit: 2019-02-14 | Discharge: 2019-02-14 | Disposition: A | Discharge status: Home / Self Care | Payer: No Typology Code available for payment source | Attending: Emergency Medicine | Admitting: Emergency Medicine

## 2019-02-14 ENCOUNTER — Encounter: Payer: Self-pay | Admitting: Emergency Medicine

## 2019-02-14 ENCOUNTER — Emergency Department: Payer: No Typology Code available for payment source

## 2019-02-14 ENCOUNTER — Other Ambulatory Visit: Payer: Self-pay

## 2019-02-14 DIAGNOSIS — M791 Myalgia, unspecified site: Secondary | ICD-10-CM | POA: Insufficient documentation

## 2019-02-14 DIAGNOSIS — Z87891 Personal history of nicotine dependence: Secondary | ICD-10-CM | POA: Diagnosis not present

## 2019-02-14 DIAGNOSIS — Y92410 Unspecified street and highway as the place of occurrence of the external cause: Secondary | ICD-10-CM | POA: Insufficient documentation

## 2019-02-14 DIAGNOSIS — Y93I9 Activity, other involving external motion: Secondary | ICD-10-CM | POA: Diagnosis not present

## 2019-02-14 DIAGNOSIS — S161XXA Strain of muscle, fascia and tendon at neck level, initial encounter: Secondary | ICD-10-CM

## 2019-02-14 DIAGNOSIS — Y999 Unspecified external cause status: Secondary | ICD-10-CM | POA: Diagnosis not present

## 2019-02-14 DIAGNOSIS — S0990XA Unspecified injury of head, initial encounter: Secondary | ICD-10-CM | POA: Diagnosis present

## 2019-02-14 DIAGNOSIS — M7918 Myalgia, other site: Secondary | ICD-10-CM

## 2019-02-14 MED ORDER — TRAMADOL HCL 50 MG PO TABS: 50.0000 mg | ORAL_TABLET | Freq: Two times a day (BID) | ORAL | 0 refills | Status: DC | PRN

## 2019-02-14 MED ORDER — CYCLOBENZAPRINE HCL 10 MG PO TABS: 10.0000 mg | ORAL_TABLET | Freq: Three times a day (TID) | ORAL | 0 refills | Status: DC | PRN

## 2019-02-14 MED ORDER — IBUPROFEN 600 MG PO TABS: 600.0000 mg | ORAL_TABLET | Freq: Three times a day (TID) | ORAL | 0 refills | Status: DC | PRN

## 2019-02-14 MED ORDER — IBUPROFEN 600 MG PO TABS
600.0000 mg | ORAL_TABLET | Freq: Once | ORAL | Status: AC
  Administered 2019-02-14: 600 mg via ORAL
  Filled 2019-02-14: qty 1

## 2019-02-14 MED ORDER — CYCLOBENZAPRINE HCL 10 MG PO TABS
10.0000 mg | ORAL_TABLET | Freq: Once | ORAL | Status: AC
  Administered 2019-02-14: 10 mg via ORAL
  Filled 2019-02-14: qty 1

## 2019-02-14 MED ORDER — TRAMADOL HCL 50 MG PO TABS
50.0000 mg | ORAL_TABLET | Freq: Once | ORAL | Status: AC
  Administered 2019-02-14: 50 mg via ORAL
  Filled 2019-02-14: qty 1

## 2019-02-14 NOTE — ED Notes (Signed)
Pt c/o numbness on right side, able to move right arm and leg but both have a tingling feeling

## 2019-02-14 NOTE — Discharge Instructions (Signed)
Follow discharge care instruction take medication as directed. °
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
## 2019-02-19 NOTE — ED Notes (Signed)
Patient called on 02/19/2019 asking about a c-collar. I advised him that if he left his here it would have been disposed of. He stated he would try a local store to try and obtain one.

## 2019-05-05 ENCOUNTER — Emergency Department
Admission: EM | Admit: 2019-05-05 | Discharge: 2019-05-05 | Disposition: A | Discharge status: Home / Self Care | Payer: Self-pay | Attending: Student in an Organized Health Care Education/Training Program | Admitting: Student in an Organized Health Care Education/Training Program

## 2019-05-05 ENCOUNTER — Other Ambulatory Visit: Payer: Self-pay

## 2019-05-05 ENCOUNTER — Emergency Department: Payer: Self-pay

## 2019-05-05 ENCOUNTER — Encounter: Payer: Self-pay | Admitting: Emergency Medicine

## 2019-05-05 DIAGNOSIS — Z720 Tobacco use: Secondary | ICD-10-CM

## 2019-05-05 DIAGNOSIS — F17210 Nicotine dependence, cigarettes, uncomplicated: Secondary | ICD-10-CM | POA: Insufficient documentation

## 2019-05-05 DIAGNOSIS — R1013 Epigastric pain: Secondary | ICD-10-CM | POA: Insufficient documentation

## 2019-05-05 DIAGNOSIS — R0789 Other chest pain: Secondary | ICD-10-CM | POA: Insufficient documentation

## 2019-05-05 LAB — COMPREHENSIVE METABOLIC PANEL
ALT: 47 U/L — ABNORMAL HIGH (ref 0–44)
AST: 49 U/L — ABNORMAL HIGH (ref 15–41)
Albumin: 4.1 g/dL (ref 3.5–5.0)
Alkaline Phosphatase: 74 U/L (ref 38–126)
Anion gap: 10 (ref 5–15)
BUN: 15 mg/dL (ref 6–20)
CO2: 25 mmol/L (ref 22–32)
Calcium: 8.9 mg/dL (ref 8.9–10.3)
Chloride: 104 mmol/L (ref 98–111)
Creatinine, Ser: 0.98 mg/dL (ref 0.61–1.24)
GFR calc Af Amer: >60 mL/min (ref >60)
GFR calc non Af Amer: >60 mL/min (ref >60)
Glucose, Bld: 122 mg/dL — ABNORMAL HIGH (ref 70–99)
Potassium: 4 mmol/L (ref 3.5–5.1)
Sodium: 139 mmol/L (ref 135–145)
Total Bilirubin: 0.7 mg/dL (ref 0.3–1.2)
Total Protein: 7.1 g/dL (ref 6.5–8.1)

## 2019-05-05 LAB — CBC
HCT: 42.7 % (ref 39.0–52.0)
Hemoglobin: 13.7 g/dL (ref 13.0–17.0)
MCH: 27.7 pg (ref 26.0–34.0)
MCHC: 32.1 g/dL (ref 30.0–36.0)
MCV: 86.4 fL (ref 80.0–100.0)
Platelets: 250 10*3/uL (ref 150–400)
RBC: 4.94 MIL/uL (ref 4.22–5.81)
RDW: 13.3 % (ref 11.5–15.5)
WBC: 4.5 10*3/uL (ref 4.0–10.5)
nRBC: 0 % (ref 0.0–0.2)

## 2019-05-05 LAB — TROPONIN I (HIGH SENSITIVITY)
Troponin I (High Sensitivity): 6 ng/L (ref <18)
Troponin I (High Sensitivity): 6 ng/L (ref <18)

## 2019-05-05 LAB — LIPASE, BLOOD: Lipase: 36 U/L (ref 11–51)

## 2019-05-05 MED ORDER — VARENICLINE TARTRATE 0.5 MG PO TABS: 0.5000 mg | ORAL_TABLET | Freq: Two times a day (BID) | ORAL | 0 refills | Status: DC

## 2019-05-05 NOTE — ED Triage Notes (Signed)
Pt c/o right side/right flank pain.  No vomiting. Feels similar to his pancreatitis but also like something else going on. No fevers.  No urinary sx.

## 2019-05-05 NOTE — Discharge Instructions (Signed)
Find resources and other ways to help you quit smoking and remain smoke-free after you quit. These resources are most helpful when you use them often. They include: Online chats with a Veterinary surgeon. Phone quitlines. Printed Materials engineer. Support groups or group counseling. Text messaging programs. Mobile phone apps. Use apps on your mobile phone or tablet that can help you stick to your quit plan. There are many free apps for mobile phones and tablets as well as websites. Examples include Quit Guide from the [employer] and smokefree.gov

## 2019-05-05 NOTE — ED Notes (Signed)
Pt presents to ED via POV with c/o generalized abdominal pain. Pt states pain has improved since he arrived to hospital. Pt also seeking information regarding quitting smoking. Pt A&O x 4, SB on EKG, NAD Noted at time of assessment.

## 2019-05-05 NOTE — ED Provider Notes (Signed)
[facility] Emergency Department Provider Note    First MD Initiated Contact with Patient 05/05/19 1454     (approximate)  I have reviewed the triage vital signs and the nursing notes.   HISTORY  Chief Complaint Abdominal Pain    HPI Benjamin Beltran is a 34 y.o. male with a history of pancreatitis presents to the ER for evaluation of right sided epigastric discomfort and chest pain.  States that it happens when he smokes.  Not currently having any chest pain or symptoms.  No nausea or vomiting.  States his pancreatitis is more related to his vaping so he switched back to smoking tobacco products.  He is smoking her today and has some discomfort.  States his primary reason for coming to the ER was to get help with smoking cessation.  Denies any fevers.    Past Medical History:  Diagnosis Date  . Elevated liver function tests unk   History reviewed. No pertinent family history. Past Surgical History:  Procedure Laterality Date  . NECK SURGERY    . tendon surgery to left hand  unk   Patient Active Problem List   Diagnosis Date Noted  . Acute renal failure due to rhabdomyolysis ([place]) 08/11/2016      Prior to Admission medications   Medication Sig Start Date End Date Taking? Authorizing Provider  cyclobenzaprine (FLEXERIL) 10 MG tablet Take 1 tablet (10 mg total) by mouth 3 (three) times daily as needed. 02/14/19   Sable Feil, PA-C  ibuprofen (ADVIL) 600 MG tablet Take 1 tablet (600 mg total) by mouth every 8 (eight) hours as needed. 02/14/19   Sable Feil, PA-C  traMADol (ULTRAM) 50 MG tablet Take 1 tablet (50 mg total) by mouth every 12 (twelve) hours as needed. 02/14/19   Sable Feil, PA-C  varenicline (CHANTIX) 0.5 MG tablet Take 1 tablet (0.5 mg total) by mouth 2 (two) times daily. Days 1-3 take once daily.  On Days 4-7 take twice daily.  Day 8 and > take two pills twice daily 05/05/19   Merlyn Lot, MD     Allergies Antihistamines, diphenhydramine-type and Tylenol [acetaminophen]    Social History Social History   Tobacco Use  . Smoking status: Former Smoker    Packs/day: 0.50    Types: Cigarettes  . Smokeless tobacco: Never Used  Substance Use Topics  . Alcohol use: No  . Drug use: Yes    Types: Marijuana    Review of Systems Patient denies headaches, rhinorrhea, blurry vision, numbness, shortness of breath, chest pain, edema, cough, abdominal pain, nausea, vomiting, diarrhea, dysuria, fevers, rashes or hallucinations unless otherwise stated above in HPI. ____________________________________________   PHYSICAL EXAM:  VITAL SIGNS: Vitals:   05/05/19 1245  BP: 121/67  Pulse: 64  Resp: 19  Temp: 98.2 F (36.8 C)  SpO2: 98%    Constitutional: Alert and oriented.  Eyes: Conjunctivae are normal.  Head: Atraumatic. Nose: No congestion/rhinnorhea. Mouth/Throat: Mucous membranes are moist.   Neck: No stridor. Painless ROM.  Cardiovascular: Normal rate, regular rhythm. Grossly normal heart sounds.  Good peripheral circulation. Respiratory: Normal respiratory effort.  No retractions. Lungs CTAB. Gastrointestinal: Soft and nontender. No distention. No abdominal bruits. No CVA tenderness. Genitourinary:  Musculoskeletal: No lower extremity tenderness nor edema.  No joint effusions. Neurologic:  Normal speech and language. No gross focal neurologic deficits are appreciated. No facial droop Skin:  Skin is warm, dry and intact. No rash noted. Psychiatric: Mood and affect are  normal. Speech and behavior are normal.  ____________________________________________   LABS (all labs ordered are listed, but only abnormal results are displayed)  Results for orders placed or performed during the hospital encounter of 05/05/19 (from the past 24 hour(s))  Lipase, blood     Status: None   Collection Time: 05/05/19 12:46 PM  Result Value Ref Range   Lipase 36 11 - 51 U/L   Comprehensive metabolic panel     Status: Abnormal   Collection Time: 05/05/19 12:46 PM  Result Value Ref Range   Sodium 139 135 - 145 mmol/L   Potassium 4.0 3.5 - 5.1 mmol/L   Chloride 104 98 - 111 mmol/L   CO2 25 22 - 32 mmol/L   Glucose, Bld 122 (H) 70 - 99 mg/dL   BUN 15 6 - 20 mg/dL   Creatinine, Ser 0.27 0.61 - 1.24 mg/dL   Calcium 8.9 8.9 - 25.3 mg/dL   Total Protein 7.1 6.5 - 8.1 g/dL   Albumin 4.1 3.5 - 5.0 g/dL   AST 49 (H) 15 - 41 U/L   ALT 47 (H) 0 - 44 U/L   Alkaline Phosphatase 74 38 - 126 U/L   Total Bilirubin 0.7 0.3 - 1.2 mg/dL   GFR calc non Af Amer >60 >60 mL/min   GFR calc Af Amer >60 >60 mL/min   Anion gap 10 5 - 15  CBC     Status: None   Collection Time: 05/05/19 12:46 PM  Result Value Ref Range   WBC 4.5 4.0 - 10.5 K/uL   RBC 4.94 4.22 - 5.81 MIL/uL   Hemoglobin 13.7 13.0 - 17.0 g/dL   HCT 66.4 40.3 - 47.4 %   MCV 86.4 80.0 - 100.0 fL   MCH 27.7 26.0 - 34.0 pg   MCHC 32.1 30.0 - 36.0 g/dL   RDW 25.9 56.3 - 87.5 %   Platelets 250 150 - 400 K/uL   nRBC 0.0 0.0 - 0.2 %  Troponin I (High Sensitivity)     Status: None   Collection Time: 05/05/19  3:13 PM  Result Value Ref Range   Troponin I (High Sensitivity) 6 <18 ng/L  Troponin I (High Sensitivity)     Status: None   Collection Time: 05/05/19  5:02 PM  Result Value Ref Range   Troponin I (High Sensitivity) 6 <18 ng/L   ____________________________________________  EKG My review and personal interpretation at Time: 15:16   Indication: chest pain  Rate: 60  Rhythm: sinus Axis: normal Other: normal intervals,  Nonspecific st and t wave abnormality.  Inferior abn is concistent with previous but wave abn in v5 and v6 appears new ____________________________________________  RADIOLOGY  I personally reviewed all radiographic images ordered to evaluate for the above acute complaints and reviewed radiology reports and findings.  These findings were personally discussed with the patient.  Please see  medical record for radiology report.  ____________________________________________   PROCEDURES  Procedure(s) performed:  Procedures    Critical Care performed: no ____________________________________________   INITIAL IMPRESSION / ASSESSMENT AND PLAN / ED COURSE  Pertinent labs & imaging results that were available during my care of the patient were reviewed by me and considered in my medical decision making (see chart for details).   DDX: ACS, pericarditis, esophagitis, boerhaaves, pe, dissection, pna, bronchitis, costochondritis   Benjamin Beltran is a 34 y.o. who presents to the ED with symptoms as described above.  Patient arrives his chief complaint to be primarily asking for  assistance with quitting smoking.  His exam is reassuring.  He is not complaining of any chest pain or discomfort at this time but has been having daily chest discomfort that he says primarily associated with smoking.  Denies any diaphoresis with this.  His EKG is somewhat abnormal as compared to previous.  His initial troponin is negative.  He is lowered by Wells criteria and PERC negative.  No evidence of pancreatitis.  Borderline elevation of LFTs but bilirubin is normal.  Chest x-ray without pneumothorax or infiltrate.  Will observe in the ER on monitor and order serial enzymes.  Clinical Course as of May 04 1756  Mon May 05, 2019  1757 Serial enzymes are negative.  He is not having active chest pain.  Not consistent with ACS.  Believe he stable appropriate for discharge home.  He is requesting prescription for Chantix which has helped him quit in the past.  As he does not have PCP established right now we will give him initiating prescription and have given him his referral for close outpatient follow-up and additional assistance with smoking cessation.  Have discussed with the patient and available family all diagnostics and treatments performed thus far and all questions were answered to the best of my  ability. The patient demonstrates understanding and agreement with plan.    [PR]    Clinical Course User Index [PR] Willy Eddy, MD    The patient was evaluated in Emergency Department today for the symptoms described in the history of present illness. He/she was evaluated in the context of the global COVID-19 pandemic, which necessitated consideration that the patient might be at risk for infection with the SARS-CoV-2 virus that causes COVID-19. Institutional protocols and algorithms that pertain to the evaluation of patients at risk for COVID-19 are in a state of rapid change based on information released by regulatory bodies including the CDC and federal and state organizations. These policies and algorithms were followed during the patient's care in the ED.  As part of my medical decision making, I reviewed the following data within the electronic MEDICAL RECORD NUMBER Nursing notes reviewed and incorporated, Labs reviewed, notes from prior ED visits and [place] Controlled Substance Database   ____________________________________________   FINAL CLINICAL IMPRESSION(S) / ED DIAGNOSES  Final diagnoses:  Atypical chest pain  Smoking trying to quit      NEW MEDICATIONS STARTED DURING THIS VISIT:  New Prescriptions   VARENICLINE (CHANTIX) 0.5 MG TABLET    Take 1 tablet (0.5 mg total) by mouth 2 (two) times daily. Days 1-3 take once daily.  On Days 4-7 take twice daily.  Day 8 and > take two pills twice daily     Note:  This document was prepared using Dragon voice recognition software and may include unintentional dictation errors.    Willy Eddy, MD 05/05/19 1758

## 2019-05-05 NOTE — ED Triage Notes (Signed)
FIRST NURSE NOTE- pt here for abdominal pain. Hx of pancreatitis. Ambulatory. NAD

## 2019-05-08 ENCOUNTER — Emergency Department: Payer: Self-pay

## 2019-05-08 ENCOUNTER — Other Ambulatory Visit: Payer: Self-pay

## 2019-05-08 ENCOUNTER — Emergency Department
Admission: EM | Admit: 2019-05-08 | Discharge: 2019-05-08 | Disposition: A | Discharge status: Home / Self Care | Payer: Self-pay | Attending: Emergency Medicine | Admitting: Emergency Medicine

## 2019-05-08 ENCOUNTER — Encounter: Payer: Self-pay | Admitting: Emergency Medicine

## 2019-05-08 DIAGNOSIS — Y939 Activity, unspecified: Secondary | ICD-10-CM | POA: Insufficient documentation

## 2019-05-08 DIAGNOSIS — S161XXA Strain of muscle, fascia and tendon at neck level, initial encounter: Secondary | ICD-10-CM

## 2019-05-08 DIAGNOSIS — S169XXA Unspecified injury of muscle, fascia and tendon at neck level, initial encounter: Secondary | ICD-10-CM | POA: Diagnosis not present

## 2019-05-08 DIAGNOSIS — Z87891 Personal history of nicotine dependence: Secondary | ICD-10-CM | POA: Insufficient documentation

## 2019-05-08 DIAGNOSIS — Z79899 Other long term (current) drug therapy: Secondary | ICD-10-CM | POA: Insufficient documentation

## 2019-05-08 DIAGNOSIS — Y999 Unspecified external cause status: Secondary | ICD-10-CM | POA: Diagnosis not present

## 2019-05-08 DIAGNOSIS — S199XXA Unspecified injury of neck, initial encounter: Secondary | ICD-10-CM | POA: Diagnosis present

## 2019-05-08 DIAGNOSIS — Y92410 Unspecified street and highway as the place of occurrence of the external cause: Secondary | ICD-10-CM | POA: Diagnosis not present

## 2019-05-08 MED ORDER — TRAMADOL HCL 50 MG PO TABS
50.0000 mg | ORAL_TABLET | Freq: Once | ORAL | Status: AC
  Administered 2019-05-08: 50 mg via ORAL
  Filled 2019-05-08: qty 1

## 2019-05-08 MED ORDER — MELOXICAM 15 MG PO TABS: 15.0000 mg | ORAL_TABLET | Freq: Every day | ORAL | 2 refills | Status: DC

## 2019-05-08 MED ORDER — BACLOFEN 10 MG PO TABS: 10.0000 mg | ORAL_TABLET | Freq: Every day | ORAL | 1 refills | Status: DC

## 2019-05-08 NOTE — Discharge Instructions (Addendum)
Follow-up with your regular doctor if not improving in 1 week.  Return emergency department if worsening. If you develop a worsening headache you need to return for a CT. Take your medication as prescribed. Apply ice to all areas that hurt.  In 3 days you may follow-up with wet heat followed by ice. See orthopedics if not better in 1 week.

## 2019-05-08 NOTE — ED Notes (Signed)
Pt discharged home after verbalizing understanding of discharge instructions; nad noted. 
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
## 2019-08-04 ENCOUNTER — Other Ambulatory Visit: Payer: Self-pay

## 2019-08-04 ENCOUNTER — Ambulatory Visit: Payer: Self-pay | Admitting: Family Medicine

## 2019-08-04 ENCOUNTER — Encounter: Payer: Self-pay | Admitting: Family Medicine

## 2019-08-04 DIAGNOSIS — F32A Depression, unspecified: Secondary | ICD-10-CM

## 2019-08-04 DIAGNOSIS — F329 Major depressive disorder, single episode, unspecified: Secondary | ICD-10-CM

## 2019-08-04 DIAGNOSIS — F419 Anxiety disorder, unspecified: Secondary | ICD-10-CM

## 2019-08-04 DIAGNOSIS — Z113 Encounter for screening for infections with a predominantly sexual mode of transmission: Secondary | ICD-10-CM

## 2019-08-04 DIAGNOSIS — B009 Herpesviral infection, unspecified: Secondary | ICD-10-CM | POA: Insufficient documentation

## 2019-08-04 LAB — GRAM STAIN

## 2019-08-04 MED ORDER — ACYCLOVIR 400 MG PO TABS: 400.0000 mg | ORAL_TABLET | Freq: Three times a day (TID) | ORAL | 2 refills | Status: AC

## 2019-08-04 NOTE — Progress Notes (Signed)
Gram stain reviewed and is negative today, so no treatment needed for gram stain per standing order. Pt aware that Acyclovir was rx'd to pharmacy by provider. Kathreen Cosier, LCSW card with contact info, as well as [employer] Card given to pt with contact info per pt request. Counseled pt per provider orders and pt states understanding. Provider orders completed.

## 2019-08-04 NOTE — Progress Notes (Signed)
[facility] Department STI clinic/screening visit  Subjective:  Benjamin Beltran is a 34 y.o. male being seen today for No chief complaint on file.    The patient reports they do not have symptoms.   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 Has ~1 HSV outbreak/year. Last outbreak 3 months ago. Denies symptoms currently.   Pt last urinated 1 hour ago.  See flowsheet for further details and programmatic requirements.   Last HIV test per pt/review of record: 01/2017 Last tetanus vaccine: 2018   No components found for: HCV  The following portions of the patient's history were reviewed and updated as appropriate: allergies, current medications, past medical history, past social history, past surgical history and problem list.  Objective:  There were no vitals filed for this visit.   Physical Exam Constitutional:      Appearance: Normal appearance.  HENT:     Head: Normocephalic and atraumatic.     Comments: No nits or hair loss    Mouth/Throat:     Mouth: Mucous membranes are moist.     Pharynx: Oropharynx is clear. No oropharyngeal exudate or posterior oropharyngeal erythema.  Pulmonary:     Effort: Pulmonary effort is normal.  Abdominal:     General: Abdomen is flat.     Palpations: Abdomen is soft. There is no hepatomegaly or mass.     Tenderness: There is no abdominal tenderness.  Genitourinary:    Pubic Area: No rash or pubic lice.      Penis: Normal and circumcised.      Testes:        Right: Mass, tenderness, swelling, testicular hydrocele or varicocele not present.        Left: Mass, tenderness, swelling, testicular hydrocele or varicocele not present.     Epididymis:     Right: Normal.     Left: Normal.     Rectum:  Normal.    Lymphadenopathy:     Head:     Right side of head: No preauricular or posterior auricular adenopathy.     Left side of head: No preauricular or posterior auricular adenopathy.     Cervical: No cervical adenopathy.     Upper Body:     Right upper body: No supraclavicular or axillary adenopathy.     Left upper body: No supraclavicular or axillary adenopathy.     Lower Body: No right inguinal adenopathy. No left inguinal adenopathy.  Skin:    General: Skin is warm and dry.     Findings: No rash.  Neurological:     Mental Status: He is alert and oriented to person, place, and time.       Assessment and Plan:  Benjamin Beltran is a 34 y.o. male presenting to the [facility] for STI screening   1. Screening examination for venereal disease -Pt without symptoms. Screenings today as below. Treat gram stain per standing order. -Patient does meet criteria for HepB, HepC Screening. Accepts these screenings. -Unclear etiology of small area of fluctuance on R side surface of scrotal sac. Pt agrees to bloodwork as below. Possibly boil or infected hair follicle as pt does shave. Suggested warm compresses to area, RTC if condition worsens or fails to improve.  -  Counseled on warning s/sx and when to seek care. Recommended condom use with all sex and discussed importance of condom use for STI prevention. - Gram stain - Gonococcus culture - HBV Antigen/Antibody [facility] Lab - HIV/HCV [facility] - Syphilis Serology, [facility] - Gonococcus culture  2. HSV infection -No s/sx of infection today, though pt requests refill of medication for next outbreak. Sent to pharmacy today.  - acyclovir (ZOVIRAX) 400 MG tablet; Take 1 tablet (400 mg total) by mouth 3 (three) times daily for 5 days.  Dispense: 15 tablet; Refill: 2  3. Anxiety and depression Pt endorses anxiety upon flowsheet questioning and would like to speak with behavioral health, will refer.  - Ambulatory  referral to [facility]   Return for screening as needed.  No future appointments.  Ann Held, PA-C

## 2019-08-07 ENCOUNTER — Encounter: Payer: Self-pay | Admitting: Family Medicine

## 2019-08-07 ENCOUNTER — Telehealth: Payer: Self-pay | Admitting: General Practice

## 2019-08-07 LAB — HEPATITIS B SURFACE ANTIGEN

## 2019-08-07 NOTE — Telephone Encounter (Signed)
CALLING REGARDING TEST RESULTS .

## 2019-08-08 NOTE — Telephone Encounter (Signed)
Phone call to patient at provided number. Patient requesting TR from 08/04/19 OV. Explained to patient TR are not in yet and needs to wait at least 10 days for results. All questions answered. Patient verbalized understanding of above. Tawny Hopping, RN

## 2019-08-09 LAB — GONOCOCCUS CULTURE

## 2019-08-12 ENCOUNTER — Encounter: Payer: Self-pay | Admitting: Family Medicine

## 2019-08-12 LAB — HM HIV SCREENING LAB: HM HIV Screening: NEGATIVE

## 2019-08-12 LAB — HM HEPATITIS C SCREENING LAB: HM Hepatitis Screen: NEGATIVE

## 2019-08-29 ENCOUNTER — Emergency Department
Admission: EM | Admit: 2019-08-29 | Discharge: 2019-08-29 | Disposition: A | Discharge status: Home / Self Care | Payer: Self-pay | Attending: Emergency Medicine | Admitting: Emergency Medicine

## 2019-08-29 ENCOUNTER — Emergency Department: Payer: Self-pay

## 2019-08-29 ENCOUNTER — Other Ambulatory Visit: Payer: Self-pay

## 2019-08-29 DIAGNOSIS — R1013 Epigastric pain: Secondary | ICD-10-CM | POA: Insufficient documentation

## 2019-08-29 DIAGNOSIS — Z87891 Personal history of nicotine dependence: Secondary | ICD-10-CM | POA: Insufficient documentation

## 2019-08-29 LAB — URINALYSIS, COMPLETE (UACMP) WITH MICROSCOPIC
Bacteria, UA: NONE SEEN
Bilirubin Urine: NEGATIVE
Glucose, UA: NEGATIVE mg/dL
Hgb urine dipstick: NEGATIVE
Ketones, ur: NEGATIVE mg/dL
Leukocytes,Ua: NEGATIVE
Nitrite: NEGATIVE
Protein, ur: NEGATIVE mg/dL
Specific Gravity, Urine: 1.004 — ABNORMAL LOW (ref 1.005–1.030)
Squamous Epithelial / HPF: NONE SEEN (ref 0–5)
pH: 6 (ref 5.0–8.0)

## 2019-08-29 LAB — COMPREHENSIVE METABOLIC PANEL
ALT: 41 U/L (ref 0–44)
AST: 37 U/L (ref 15–41)
Albumin: 4.4 g/dL (ref 3.5–5.0)
Alkaline Phosphatase: 50 U/L (ref 38–126)
Anion gap: 6 (ref 5–15)
BUN: 10 mg/dL (ref 6–20)
CO2: 28 mmol/L (ref 22–32)
Calcium: 9.1 mg/dL (ref 8.9–10.3)
Chloride: 104 mmol/L (ref 98–111)
Creatinine, Ser: 1.07 mg/dL (ref 0.61–1.24)
GFR calc Af Amer: >60 mL/min (ref >60)
GFR calc non Af Amer: >60 mL/min (ref >60)
Glucose, Bld: 103 mg/dL — ABNORMAL HIGH (ref 70–99)
Potassium: 4.2 mmol/L (ref 3.5–5.1)
Sodium: 138 mmol/L (ref 135–145)
Total Bilirubin: 0.5 mg/dL (ref 0.3–1.2)
Total Protein: 7.5 g/dL (ref 6.5–8.1)

## 2019-08-29 LAB — CBC
HCT: 42.6 % (ref 39.0–52.0)
Hemoglobin: 13.9 g/dL (ref 13.0–17.0)
MCH: 27.7 pg (ref 26.0–34.0)
MCHC: 32.6 g/dL (ref 30.0–36.0)
MCV: 84.9 fL (ref 80.0–100.0)
Platelets: 219 10*3/uL (ref 150–400)
RBC: 5.02 MIL/uL (ref 4.22–5.81)
RDW: 12.2 % (ref 11.5–15.5)
WBC: 4.6 10*3/uL (ref 4.0–10.5)
nRBC: 0 % (ref 0.0–0.2)

## 2019-08-29 LAB — LIPASE, BLOOD: Lipase: 96 U/L — ABNORMAL HIGH (ref 11–51)

## 2019-08-29 MED ORDER — MORPHINE SULFATE (PF) 4 MG/ML IV SOLN
4.0000 mg | Freq: Once | INTRAVENOUS | Status: AC
  Administered 2019-08-29: 4 mg via INTRAVENOUS
  Filled 2019-08-29: qty 1

## 2019-08-29 MED ORDER — SODIUM CHLORIDE 0.9% FLUSH: 3.0000 mL | Freq: Once | INTRAVENOUS | Status: DC

## 2019-08-29 MED ORDER — IOHEXOL 300 MG/ML  SOLN
125.0000 mL | Freq: Once | INTRAMUSCULAR | Status: AC | PRN
  Administered 2019-08-29: 125 mL via INTRAVENOUS

## 2019-08-29 MED ORDER — OMEPRAZOLE 20 MG PO CPDR: 20.0000 mg | DELAYED_RELEASE_CAPSULE | Freq: Every day | ORAL | 1 refills | Status: DC

## 2019-08-29 MED ORDER — LACTATED RINGERS IV BOLUS
1000.0000 mL | Freq: Once | INTRAVENOUS | Status: AC
  Administered 2019-08-29: 1000 mL via INTRAVENOUS

## 2019-08-29 NOTE — ED Triage Notes (Signed)
Pt c/o RUQ radiating into the back intermittently for "a while" and today is concerned because he noticed blood in his urine, denies N/V/D.Marland Kitchen

## 2019-08-29 NOTE — ED Provider Notes (Signed)
[facility] Emergency Department Provider Note   ____________________________________________   First MD Initiated Contact with Patient 08/29/19 1516     (approximate)  I have reviewed the triage vital signs and the nursing notes.   HISTORY  Chief Complaint Abdominal Pain    HPI Benjamin Beltran is a 34 y.o. male with no significant past medical history who presents to the ED complaining of abdominal pain.  Patient reports that he has been dealing with intermittent pain in his right upper quadrant in his epigastrium for the past couple of months.  He states it seems to be worse when he eats and can radiate towards the right side of his back.  He denies any associated nausea or vomiting, but reports solid foods have been difficult for him and he will often eat smoothies for meals as he seems to tolerate these better.  He does state that he has been eating pizza for the past couple of days and his pain has seemed worse.  He has not had any diarrhea and denies any fevers, cough, chest pain, or shortness of breath.  He does admit to issues with alcohol abuse in the past, was drinking heavily a couple of months ago but has not had any alcohol in the past 2 months.  He denies any drug use.        Past Medical History:  Diagnosis Date  . Elevated liver function tests unk    Patient Active Problem List   Diagnosis Date Noted  . 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 Acute renal failure due to rhabdomyolysis (HCC) 08/11/2016    Past Surgical History:  Procedure Laterality Date  . NECK SURGERY    . tendon surgery to left hand  unk    Prior to Admission medications   Medication Sig Start Date End Date Taking? Authorizing Provider  omeprazole (PRILOSEC) 20 MG capsule Take 1 capsule (20 mg total) by mouth daily. 08/29/19 08/28/20  Chesley Noon, MD    Allergies Antihistamines, diphenhydramine-type and Tylenol [acetaminophen]  No family history on file.  Social  History Social History   Tobacco Use  . Smoking status: Former Smoker    Packs/day: 0.50  . Smokeless tobacco: Never Used  Vaping Use  . Vaping Use: Every day  . Devices: 1 cartridge q 2 days  Substance Use Topics  . Alcohol use: No  . Drug use: Not Currently    Types: Marijuana    Review of Systems  Constitutional: No fever/chills Eyes: No visual changes. ENT: No sore throat. Cardiovascular: Denies chest pain. Respiratory: Denies shortness of breath. Gastrointestinal: Positive for abdominal pain.  No nausea, no vomiting.  No diarrhea.  No constipation. Genitourinary: Negative for dysuria. Musculoskeletal: Negative for back pain. Skin: Negative for rash. Neurological: Negative for headaches, focal weakness or numbness.  ____________________________________________   PHYSICAL EXAM:  VITAL SIGNS: ED Triage Vitals  Enc Vitals Group     BP 08/29/19 1209 121/77     Pulse Rate 08/29/19 1209 (!) 53     Resp 08/29/19 1209 16     Temp 08/29/19 1213 98 F (36.7 C)     Temp Source 08/29/19 1209 Oral     SpO2 08/29/19 1209 99 %     Weight 08/29/19 1210 238 lb (108 kg)     Height 08/29/19 1210 5\' 11"  (1.803 m)     Head Circumference --      Peak Flow --      Pain Score 08/29/19 1210  0     Pain Loc --      Pain Edu? --      Excl. in GC? --     Constitutional: Alert and oriented. Eyes: Conjunctivae are normal. Head: Atraumatic. Nose: No congestion/rhinnorhea. Mouth/Throat: Mucous membranes are moist. Neck: Normal ROM Cardiovascular: Normal rate, regular rhythm. Grossly normal heart sounds. Respiratory: Normal respiratory effort.  No retractions. Lungs CTAB. Gastrointestinal: Soft and tender to palpation in the epigastrium and right upper quadrant. No distention. Genitourinary: deferred Musculoskeletal: No lower extremity tenderness nor edema. Neurologic:  Normal speech and language. No gross focal neurologic deficits are appreciated. Skin:  Skin is warm, dry and  intact. No rash noted. Psychiatric: Mood and affect are normal. Speech and behavior are normal.  ____________________________________________   LABS (all labs ordered are listed, but only abnormal results are displayed)  Labs Reviewed  LIPASE, BLOOD - Abnormal; Notable for the following components:      Result Value   Lipase 96 (*)    All other components within normal limits  COMPREHENSIVE METABOLIC PANEL - Abnormal; Notable for the following components:   Glucose, Bld 103 (*)    All other components within normal limits  URINALYSIS, COMPLETE (UACMP) WITH MICROSCOPIC - Abnormal; Notable for the following components:   Color, Urine STRAW (*)    APPearance CLEAR (*)    Specific Gravity, Urine 1.004 (*)    All other components within normal limits  CBC   ____________________________________________  EKG  ED ECG REPORT I, Chesley Noon, the attending physician, personally viewed and interpreted this ECG.   Date: 08/29/2019  EKG Time: 12:21  Rate: 55  Rhythm: sinus bradycardia  Axis: Normal  Intervals:none  ST&T Change: Inferior T wave inversions similar to prior   PROCEDURES  Procedure(s) performed (including Critical Care):  Procedures   ____________________________________________   INITIAL IMPRESSION / ASSESSMENT AND PLAN / ED COURSE       34 year old male with no significant past medical history presents to the ED complaining of intermittent epigastric and right upper quadrant abdominal pain for the past couple of months that can often radiate to his right upper back.  He continues to have some pain currently with tenderness in his epigastrium.  Lab work shows mildly elevated lipase but is otherwise unremarkable.  CT scan was performed and negative for acute process, no evidence of gallstones, cholecystitis, or pancreatitis.  He may have a very mild pancreatitis based off his mildly elevated lipase, but given his pain is improved following IV morphine and he is  tolerating p.o. without difficulty, he is appropriate for discharge home with PCP follow-up.  He was counseled to continue avoiding alcohol as well as fatty foods, we will also start him on a PPI for possible peptic ulcer disease.  He was counseled to return to the ED for new or worsening symptoms, patient agrees with plan.      ____________________________________________   FINAL CLINICAL IMPRESSION(S) / ED DIAGNOSES  Final diagnoses:  Epigastric pain     ED Discharge Orders         Ordered    omeprazole (PRILOSEC) 20 MG capsule  Daily     Discontinue  Reprint     08/29/19 1828           Note:  This document was prepared using Dragon voice recognition software and may include unintentional dictation errors.   Chesley Noon, MD 08/29/19 [phone]

## 2019-08-29 NOTE — ED Notes (Signed)
ED Provider at bedside. 

## 2019-09-02 ENCOUNTER — Telehealth: Payer: Self-pay | Admitting: General Practice

## 2019-09-02 NOTE — Telephone Encounter (Signed)
Individual has been contacted 3+ times regarding ED referral. No further attempts to contact individual will be made. 

## 2019-10-09 ENCOUNTER — Other Ambulatory Visit: Payer: Self-pay | Admitting: Family Medicine

## 2019-10-09 NOTE — Telephone Encounter (Signed)
Benjamin Beltran wants to know if the Rx for an Sti can bel called back in to [facility] he didn't fill it

## 2019-10-09 NOTE — Telephone Encounter (Signed)
Phone call to pt. Pt states he would like the rx for herpes medication resent to his pharmacy since he never picked it up. Pt counseled that prescriptions are normally good for a year and I can see several refills on the Rx that was sent to [facility]. Pt to contact his pharmacy and let them know he needs it filled and will be picking it up. Pt to call us back if he has any issues.

## 2019-11-25 ENCOUNTER — Other Ambulatory Visit: Payer: Self-pay

## 2019-11-25 ENCOUNTER — Encounter: Payer: Self-pay | Admitting: Emergency Medicine

## 2019-11-25 ENCOUNTER — Emergency Department
Admission: EM | Admit: 2019-11-25 | Discharge: 2019-11-25 | Disposition: A | Discharge status: Home / Self Care | Attending: Emergency Medicine | Admitting: Emergency Medicine

## 2019-11-25 ENCOUNTER — Emergency Department

## 2019-11-25 DIAGNOSIS — M545 Low back pain, unspecified: Secondary | ICD-10-CM | POA: Diagnosis present

## 2019-11-25 DIAGNOSIS — M79671 Pain in right foot: Secondary | ICD-10-CM | POA: Insufficient documentation

## 2019-11-25 DIAGNOSIS — Y9389 Activity, other specified: Secondary | ICD-10-CM | POA: Insufficient documentation

## 2019-11-25 DIAGNOSIS — Y9289 Other specified places as the place of occurrence of the external cause: Secondary | ICD-10-CM | POA: Insufficient documentation

## 2019-11-25 DIAGNOSIS — W19XXXA Unspecified fall, initial encounter: Secondary | ICD-10-CM

## 2019-11-25 DIAGNOSIS — W010XXA Fall on same level from slipping, tripping and stumbling without subsequent striking against object, initial encounter: Secondary | ICD-10-CM | POA: Diagnosis not present

## 2019-11-25 MED ORDER — METHOCARBAMOL 500 MG PO TABS: 500.0000 mg | ORAL_TABLET | Freq: Three times a day (TID) | ORAL | 0 refills | Status: AC | PRN

## 2019-11-25 MED ORDER — IBUPROFEN 800 MG PO TABS
800.0000 mg | ORAL_TABLET | Freq: Once | ORAL | Status: AC
  Administered 2019-11-25: 800 mg via ORAL
  Filled 2019-11-25: qty 1

## 2019-11-25 MED ORDER — MELOXICAM 15 MG PO TABS: 15.0000 mg | ORAL_TABLET | Freq: Every day | ORAL | 1 refills | Status: AC

## 2019-11-25 NOTE — ED Provider Notes (Signed)
Emergency Department Provider Note  ____________________________________________  Time seen: Approximately 5:08 PM  I have reviewed the triage vital signs and the nursing notes.   HISTORY  Chief Complaint Back Pain (Work-related injury) and Leg Pain   Historian Patient     HPI Benjamin Beltran is a 34 y.o. male presents to the emergency department with low back pain and right foot pain after a mechanical fall.  Patient states that water had collected near the bathroom and patient slipped and fell.  Patient did not hit his head or his neck.  No numbness or tingling in the upper and lower extremities.  No chest pain, chest tightness or abdominal pain.   Past Medical History:  Diagnosis Date  . Elevated liver function tests unk     Immunizations up to date:  Yes.     Past Medical History:  Diagnosis Date  . Elevated liver function tests unk    Patient Active Problem List   Diagnosis Date Noted  . 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 Acute renal failure due to rhabdomyolysis (HCC) 08/11/2016    Past Surgical History:  Procedure Laterality Date  . NECK SURGERY    . tendon surgery to left hand  unk    Prior to Admission medications   Medication Sig Start Date End Date Taking? Authorizing Provider  meloxicam (MOBIC) 15 MG tablet Take 1 tablet (15 mg total) by mouth daily for 7 days. 11/25/19 12/02/19  Orvil Feil, PA-C  methocarbamol (ROBAXIN) 500 MG tablet Take 1 tablet (500 mg total) by mouth every 8 (eight) hours as needed for up to 5 days. 11/25/19 11/30/19  Orvil Feil, PA-C  omeprazole (PRILOSEC) 20 MG capsule Take 1 capsule (20 mg total) by mouth daily. 08/29/19 08/28/20  Chesley Noon, MD    Allergies Antihistamines, diphenhydramine-type and Tylenol [acetaminophen]  History reviewed. No pertinent family history.  Social History Social History   Tobacco Use  . Smoking status: Former Smoker    Packs/day: 0.50  . Smokeless tobacco: Never Used  Vaping Use   . Vaping Use: Every day  . Devices: 1 cartridge q 2 days  Substance Use Topics  . Alcohol use: No  . Drug use: Not Currently    Types: Marijuana     Review of Systems  Constitutional: No fever/chills Eyes:  No discharge ENT: No upper respiratory complaints. Respiratory: no cough. No SOB/ use of accessory muscles to breath Gastrointestinal:   No nausea, no vomiting.  No diarrhea.  No constipation. Musculoskeletal: patient has low back pain and right foot pain.  Skin: Negative for rash, abrasions, lacerations, ecchymosis.    ____________________________________________   PHYSICAL EXAM:  VITAL SIGNS: ED Triage Vitals  Enc Vitals Group     BP 11/25/19 1604 139/74     Pulse Rate 11/25/19 1604 62     Resp 11/25/19 1604 18     Temp 11/25/19 1604 98.6 F (37 C)     Temp Source 11/25/19 1604 Oral     SpO2 11/25/19 1604 97 %     Weight 11/25/19 1604 240 lb (108.9 kg)     Height 11/25/19 1604 5\' 11"  (1.803 m)     Head Circumference --      Peak Flow --      Pain Score 11/25/19 1610 9     Pain Loc --      Pain Edu? --      Excl. in GC? --      Constitutional: Alert and oriented.  Well appearing and in no acute distress. Eyes: Conjunctivae are normal. PERRL. EOMI. Head: Atraumatic. ENT:      Nose: No congestion/rhinnorhea.      Mouth/Throat: Mucous membranes are moist.  Neck: No stridor.  No cervical spine tenderness to palpation. Cardiovascular: Normal rate, regular rhythm. Normal S1 and S2.  Good peripheral circulation. Respiratory: Normal respiratory effort without tachypnea or retractions. Lungs CTAB. Good air entry to the bases with no decreased or absent breath sounds Gastrointestinal: Bowel sounds x 4 quadrants. Soft and nontender to palpation. No guarding or rigidity. No distention. Musculoskeletal: Full range of motion to all extremities. No obvious deformities noted Neurologic:  Normal for age. No gross focal neurologic deficits are appreciated.  Skin:  Skin is  warm, dry and intact. No rash noted. Psychiatric: Mood and affect are normal for age. Speech and behavior are normal.   ____________________________________________   LABS (all labs ordered are listed, but only abnormal results are displayed)  Labs Reviewed - No data to display ____________________________________________  EKG   ____________________________________________  RADIOLOGY Geraldo Pitter, personally viewed and evaluated these images (plain radiographs) as part of my medical decision making, as well as reviewing the written report by the radiologist.  DG Lumbar Spine 2-3 Views  Result Date: 11/25/2019 CLINICAL DATA:  Fall EXAM: LUMBAR SPINE - 2-3 VIEW COMPARISON:  04/23/2016, chest x-ray 05/05/2019 FINDINGS: For the purposes of reporting, vestigial ribs will be assumed at L1. Last well-formed lumbar type vertebra will be designated L5. Alignment within normal limits. Vertebral body heights are normal. The disc spaces are within normal limits. IMPRESSION: Negative. Electronically Signed   By: Jasmine Pang M.D.   On: 11/25/2019 17:53   DG Foot Complete Right  Result Date: 11/25/2019 CLINICAL DATA:  Fall great toe pain EXAM: RIGHT FOOT COMPLETE - 3+ VIEW COMPARISON:  None. FINDINGS: No acute displaced fracture or malalignment. Old appearing fracture deformity of the fifth metatarsal. Joint spaces are maintained IMPRESSION: No acute osseous abnormality. Electronically Signed   By: Jasmine Pang M.D.   On: 11/25/2019 17:52    ____________________________________________    PROCEDURES  Procedure(s) performed:     Procedures     Medications  ibuprofen (ADVIL) tablet 800 mg (800 mg Oral Given 11/25/19 1710)     ____________________________________________   INITIAL IMPRESSION / ASSESSMENT AND PLAN / ED COURSE  Pertinent labs & imaging results that were available during my care of the patient were reviewed by me and considered in my medical decision making  (see chart for details).      Assessment and Plan: Fall:  34 year old male presents to the emergency department after a mechanical fall.  Patient was alert, active and nontoxic-appearing.  He was primarily complaining of low back pain and right foot pain.  No bony abnormality was visualized in the lumbar spine or the right foot.  Patient was discharged with meloxicam and Robaxin.  He was advised to follow-up with primary care if discomfort persist.  All patient questions were answered.      ____________________________________________  FINAL CLINICAL IMPRESSION(S) / ED DIAGNOSES  Final diagnoses:  Fall, initial encounter      NEW MEDICATIONS STARTED DURING THIS VISIT:  ED Discharge Orders         Ordered    meloxicam (MOBIC) 15 MG tablet  Daily        11/25/19 1800    methocarbamol (ROBAXIN) 500 MG tablet  Every 8 hours PRN        11/25/19  1800              This chart was dictated using voice recognition software/Dragon. Despite best efforts to proofread, errors can occur which can change the meaning. Any change was purely unintentional.     Orvil Feil, PA-C 11/25/19 Redmond Baseman, MD 11/26/19 [phone]

## 2019-11-25 NOTE — ED Triage Notes (Addendum)
Pt arrived via POV with c/o slipping on water at work today around 220pm, pt states when he slipped his right foot twisted and fell onto his back. C/o generalized back pain and R great toe pain as well as R leg pain  Pt ambulatory without difficulty.  Pt works at [employer]. Per profile, no UDS is needed.

## 2019-11-25 NOTE — ED Notes (Signed)
See triage note. Pt ambulatory to room. Pt stating he slipped on water at work and fell and hurt is back and right toe. [facility] [place] does not require UDS or blood work.

## 2019-11-25 NOTE — Discharge Instructions (Signed)
Take Meloxicam for pain and inflammation.  °

## 2019-12-18 ENCOUNTER — Ambulatory Visit: Payer: Medicaid Other

## 2019-12-19 ENCOUNTER — Ambulatory Visit: Payer: Medicaid Other

## 2019-12-23 ENCOUNTER — Ambulatory Visit: Payer: Medicaid Other

## 2019-12-24 ENCOUNTER — Ambulatory Visit: Payer: Medicaid Other

## 2019-12-25 ENCOUNTER — Other Ambulatory Visit: Payer: Self-pay

## 2019-12-25 ENCOUNTER — Ambulatory Visit: Payer: Self-pay | Admitting: Physician Assistant

## 2019-12-25 ENCOUNTER — Encounter: Payer: Self-pay | Admitting: Physician Assistant

## 2019-12-25 DIAGNOSIS — Z113 Encounter for screening for infections with a predominantly sexual mode of transmission: Secondary | ICD-10-CM

## 2019-12-25 LAB — GRAM STAIN

## 2019-12-25 NOTE — Progress Notes (Signed)
   [facility] Department STI clinic/screening visit  Subjective:  Benjamin Beltran is a 34 y.o. male being seen today for an STI screening visit. The patient reports they do not have symptoms.    Patient has the following medical conditions:   Patient Active Problem List   Diagnosis Date Noted  . 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 Acute renal failure due to rhabdomyolysis [facility]) 08/11/2016     Chief Complaint  Patient presents with  . SEXUALLY TRANSMITTED DISEASE    screening    HPI  Patient reports that he is not having symptoms but would like a screening today.  Denies chronic conditions and regular medicines.  Reports last HIV and Hep screening was 3 months ago.  Last void prior to sample collection for Gram stain was a little less than 2 hr ago.   See flowsheet for further details and programmatic requirements.    The following portions of the patient's history were reviewed and updated as appropriate: allergies, current medications, past medical history, past social history, past surgical history and problem list.  Objective:  There were no vitals filed for this visit.  Physical Exam Constitutional:      General: He is not in acute distress.    Appearance: Normal appearance.  HENT:     Head: Normocephalic and atraumatic.     Comments: No nits,lice, or hair loss. No cervical, supraclavicular or axillary adenopathy.    Mouth/Throat:     Mouth: Mucous membranes are moist.     Pharynx: Oropharynx is clear. No oropharyngeal exudate or posterior oropharyngeal erythema.  Eyes:     Conjunctiva/sclera: Conjunctivae normal.  Pulmonary:     Effort: Pulmonary effort is normal.  Abdominal:     Palpations: Abdomen is soft. There is no mass.     Tenderness: There is no abdominal tenderness. There is no guarding or rebound.  Genitourinary:    Penis: Normal.      Testes: Normal.     Comments: Pubic area without nits, lice, hair loss, edema, erythema, lesions and inguinal  adenopathy. Penis circumcised without rash, lesions and discharge at meatus. Musculoskeletal:     Cervical back: Neck supple. No tenderness.  Skin:    General: Skin is warm and dry.     Findings: No bruising, erythema, lesion or rash.  Neurological:     Mental Status: He is alert and oriented to person, place, and time.  Psychiatric:        Mood and Affect: Mood normal.        Behavior: Behavior normal.        Thought Content: Thought content normal.        Judgment: Judgment normal.       Assessment and Plan:  Benjamin Beltran is a 34 y.o. male presenting to the [facility] Department for STI screening  1. Screening for STD (sexually transmitted disease) Patient into clinic without symptoms. Rec condoms with all sex. Await test results.  Counseled that RN will call if needs to RTC for treatment once results are back. - Gram stain - Gonococcus culture - HIV [facility] - Syphilis Serology, [facility]     Return for PRN.  No future appointments.  Benjamin Holmes, PA

## 2019-12-25 NOTE — Progress Notes (Signed)
Gram stain reviewed and is negative today, so no treatment needed for gram stain per standing order. Provider orders completed. 

## 2019-12-30 LAB — GONOCOCCUS CULTURE

## 2020-02-26 ENCOUNTER — Emergency Department
Admission: EM | Admit: 2020-02-26 | Discharge: 2020-02-27 | Disposition: A | Discharge status: Home / Self Care | Payer: Medicaid Other | Attending: Emergency Medicine | Admitting: Emergency Medicine

## 2020-02-26 ENCOUNTER — Other Ambulatory Visit: Payer: Self-pay

## 2020-02-26 ENCOUNTER — Encounter: Payer: Self-pay | Admitting: Emergency Medicine

## 2020-02-26 DIAGNOSIS — R110 Nausea: Secondary | ICD-10-CM | POA: Insufficient documentation

## 2020-02-26 DIAGNOSIS — N509 Disorder of male genital organs, unspecified: Secondary | ICD-10-CM | POA: Insufficient documentation

## 2020-02-26 DIAGNOSIS — R109 Unspecified abdominal pain: Secondary | ICD-10-CM | POA: Insufficient documentation

## 2020-02-26 DIAGNOSIS — Z5321 Procedure and treatment not carried out due to patient leaving prior to being seen by health care provider: Secondary | ICD-10-CM | POA: Insufficient documentation

## 2020-02-26 LAB — COMPREHENSIVE METABOLIC PANEL
ALT: 45 U/L — ABNORMAL HIGH (ref 0–44)
AST: 57 U/L — ABNORMAL HIGH (ref 15–41)
Albumin: 4.1 g/dL (ref 3.5–5.0)
Alkaline Phosphatase: 55 U/L (ref 38–126)
Anion gap: 10 (ref 5–15)
BUN: 18 mg/dL (ref 6–20)
CO2: 28 mmol/L (ref 22–32)
Calcium: 9.2 mg/dL (ref 8.9–10.3)
Chloride: 101 mmol/L (ref 98–111)
Creatinine, Ser: 1.03 mg/dL (ref 0.61–1.24)
GFR, Estimated: >60 mL/min (ref >60)
Glucose, Bld: 108 mg/dL — ABNORMAL HIGH (ref 70–99)
Potassium: 4 mmol/L (ref 3.5–5.1)
Sodium: 139 mmol/L (ref 135–145)
Total Bilirubin: 0.6 mg/dL (ref 0.3–1.2)
Total Protein: 7.3 g/dL (ref 6.5–8.1)

## 2020-02-26 LAB — CBC
HCT: 41.2 % (ref 39.0–52.0)
Hemoglobin: 13.5 g/dL (ref 13.0–17.0)
MCH: 28.1 pg (ref 26.0–34.0)
MCHC: 32.8 g/dL (ref 30.0–36.0)
MCV: 85.8 fL (ref 80.0–100.0)
Platelets: 212 10*3/uL (ref 150–400)
RBC: 4.8 MIL/uL (ref 4.22–5.81)
RDW: 13 % (ref 11.5–15.5)
WBC: 6 10*3/uL (ref 4.0–10.5)
nRBC: 0 % (ref 0.0–0.2)

## 2020-02-26 LAB — LIPASE, BLOOD: Lipase: 28 U/L (ref 11–51)

## 2020-02-26 NOTE — ED Triage Notes (Signed)
Pt to ED via POV with c/o abdominal pain x 1 month, also c/o lump to R testicle that he noticed yesterday. Pt states lump to R testicle is painful if squeezed or pressed on. Pt c/o nausea at this time, denies vomiting or diarrhea.

## 2020-02-27 NOTE — ED Notes (Signed)
No answer when called several times from lobby 

## 2020-04-08 ENCOUNTER — Emergency Department: Payer: Self-pay

## 2020-04-08 ENCOUNTER — Other Ambulatory Visit: Payer: Self-pay

## 2020-04-08 DIAGNOSIS — K029 Dental caries, unspecified: Secondary | ICD-10-CM | POA: Insufficient documentation

## 2020-04-08 DIAGNOSIS — N5089 Other specified disorders of the male genital organs: Secondary | ICD-10-CM

## 2020-04-08 DIAGNOSIS — N503 Cyst of epididymis: Secondary | ICD-10-CM | POA: Insufficient documentation

## 2020-04-08 DIAGNOSIS — R1011 Right upper quadrant pain: Secondary | ICD-10-CM | POA: Insufficient documentation

## 2020-04-08 DIAGNOSIS — Z87891 Personal history of nicotine dependence: Secondary | ICD-10-CM | POA: Insufficient documentation

## 2020-04-08 LAB — LIPASE, BLOOD: Lipase: 51 U/L (ref 11–51)

## 2020-04-08 LAB — BASIC METABOLIC PANEL WITH GFR
Anion gap: 9 (ref 5–15)
BUN: 12 mg/dL (ref 6–20)
CO2: 28 mmol/L (ref 22–32)
Calcium: 8.9 mg/dL (ref 8.9–10.3)
Chloride: 100 mmol/L (ref 98–111)
Creatinine, Ser: 1.06 mg/dL (ref 0.61–1.24)
GFR, Estimated: >60 mL/min
Glucose, Bld: 99 mg/dL (ref 70–99)
Potassium: 4 mmol/L (ref 3.5–5.1)
Sodium: 137 mmol/L (ref 135–145)

## 2020-04-08 LAB — CBC
HCT: 40.7 % (ref 39.0–52.0)
Hemoglobin: 13.5 g/dL (ref 13.0–17.0)
MCH: 28.2 pg (ref 26.0–34.0)
MCHC: 33.2 g/dL (ref 30.0–36.0)
MCV: 85.1 fL (ref 80.0–100.0)
Platelets: 191 10*3/uL (ref 150–400)
RBC: 4.78 MIL/uL (ref 4.22–5.81)
RDW: 13 % (ref 11.5–15.5)
WBC: 6.1 10*3/uL (ref 4.0–10.5)
nRBC: 0 % (ref 0.0–0.2)

## 2020-04-08 NOTE — ED Triage Notes (Signed)
Pt presenting to the ER with multiple complaints. Pt states that abdominal pain "has been here for a long tine." Pt states he has a "knot on my right testicle, and it has been there for a while too." Pt also states he thinks he has a left upper dental abscess, with a broken tooth, for 4 years.  Pt states he took an at home STI test and thinks it was positive.

## 2020-04-08 NOTE — ED Notes (Signed)
ER provider made aware of pt. Please see orders

## 2020-04-09 ENCOUNTER — Emergency Department: Payer: Self-pay

## 2020-04-09 ENCOUNTER — Emergency Department
Admission: EM | Admit: 2020-04-09 | Discharge: 2020-04-09 | Disposition: A | Discharge status: Home / Self Care | Payer: Self-pay | Attending: Emergency Medicine | Admitting: Emergency Medicine

## 2020-04-09 DIAGNOSIS — R1011 Right upper quadrant pain: Secondary | ICD-10-CM

## 2020-04-09 DIAGNOSIS — N5089 Other specified disorders of the male genital organs: Secondary | ICD-10-CM

## 2020-04-09 DIAGNOSIS — N503 Cyst of epididymis: Secondary | ICD-10-CM

## 2020-04-09 DIAGNOSIS — K029 Dental caries, unspecified: Secondary | ICD-10-CM

## 2020-04-09 LAB — URINALYSIS, ROUTINE W REFLEX MICROSCOPIC
Bilirubin Urine: NEGATIVE
Glucose, UA: NEGATIVE mg/dL
Hgb urine dipstick: NEGATIVE
Ketones, ur: NEGATIVE mg/dL
Leukocytes,Ua: NEGATIVE
Nitrite: NEGATIVE
Protein, ur: NEGATIVE mg/dL
Specific Gravity, Urine: 1.011 (ref 1.005–1.030)
pH: 6 (ref 5.0–8.0)

## 2020-04-09 LAB — HEPATIC FUNCTION PANEL
ALT: 30 U/L (ref 0–44)
AST: 41 U/L (ref 15–41)
Albumin: 4 g/dL (ref 3.5–5.0)
Alkaline Phosphatase: 57 U/L (ref 38–126)
Bilirubin, Direct: <0.1 mg/dL (ref 0.0–0.2)
Total Bilirubin: 0.6 mg/dL (ref 0.3–1.2)
Total Protein: 7 g/dL (ref 6.5–8.1)

## 2020-04-09 LAB — CHLAMYDIA/NGC RT PCR (ARMC ONLY)
Chlamydia Tr: NOT DETECTED
N gonorrhoeae: NOT DETECTED

## 2020-04-09 MED ORDER — PENICILLIN V POTASSIUM 250 MG PO TABS
500.0000 mg | ORAL_TABLET | Freq: Once | ORAL | Status: AC
  Administered 2020-04-09: 500 mg via ORAL
  Filled 2020-04-09: qty 2

## 2020-04-09 MED ORDER — PENICILLIN V POTASSIUM 500 MG PO TABS: 500.0000 mg | ORAL_TABLET | Freq: Four times a day (QID) | ORAL | 0 refills | Status: DC

## 2020-04-09 NOTE — ED Provider Notes (Signed)
[facility] Emergency Department Provider Note  ____________________________________________   Event Date/Time   First MD Initiated Contact with Patient 04/09/20 0033     (approximate)  I have reviewed the triage vital signs and the nursing notes.   HISTORY  Chief Complaint Groin Swelling, Abdominal Pain, and Dental Injury    HPI Benjamin Beltran is a 35 y.o. male with no significant past medical history who presents to the emergency department with multiple complaints.  He reports intermittent right upper quadrant abdominal pain for the past year.  States it has gotten worse over the past several days.  No fevers, nausea, vomiting, diarrhea, dysuria, hematuria.  Also reports he has had upper left dental pain for the past several days.  No facial swelling.  No difficulty swallowing, speaking or breathing.  Does not have a local dentist.  Also states that he has noticed a mass in his right testicle that has been present for several years.  He feels like it may have gotten larger and is tender.  No redness, swelling, discoloration to the scrotum.        Past Medical History:  Diagnosis Date  . Elevated liver function tests unk    Patient Active Problem List   Diagnosis Date Noted  . 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 Acute renal failure due to rhabdomyolysis (HCC) 08/11/2016    Past Surgical History:  Procedure Laterality Date  . NECK SURGERY    . tendon surgery to left hand  unk    Prior to Admission medications   Medication Sig Start Date End Date Taking? Authorizing Provider  penicillin v potassium (VEETID) 500 MG tablet Take 1 tablet (500 mg total) by mouth 4 (four) times daily. 04/09/20  Yes Ashir Kunz, Layla Maw, DO  cyclobenzaprine (FLEXERIL) 5 MG tablet Take 1 or 2 tablets every 8 hours as needed for spasms for up to 5 days 12/22/19   [provider]  methocarbamol (ROBAXIN) 750 MG tablet Take by mouth at bedtime. 12/04/19   [provider]  omeprazole (PRILOSEC) 20 MG capsule Take 1 capsule (20 mg total) by mouth daily. 08/29/19 08/28/20  Chesley Noon, MD  predniSONE (DELTASONE) 20 MG tablet Take 20 mg by mouth 2 (two) times daily. 12/11/19   [provider]    Allergies Antihistamines, diphenhydramine-type and Tylenol [acetaminophen]  No family history on file.  Social History Social History   Tobacco Use  . Smoking status: Former Smoker    Packs/day: 0.50  . Smokeless tobacco: Never Used  Vaping Use  . Vaping Use: Every day  . Devices: 1 cartridge q 2 days  Substance Use Topics  . Alcohol use: No  . Drug use: Not Currently    Types: Marijuana    Review of Systems Constitutional: No fever. Eyes: No visual changes. ENT: No sore throat. Cardiovascular: Denies chest pain. Respiratory: Denies shortness of breath. Gastrointestinal: No nausea, vomiting, diarrhea. Genitourinary: Negative for dysuria. Musculoskeletal: Negative for back pain. Skin: Negative for rash. Neurological: Negative for focal weakness or numbness.  ____________________________________________   PHYSICAL EXAM:  VITAL SIGNS: ED Triage Vitals  Enc Vitals Group     BP 04/08/20 2157 124/77     Pulse Rate 04/08/20 2157 68     Resp 04/08/20 2157 18     Temp 04/08/20 2157 (!) 97.5 F (36.4 C)     Temp src --      SpO2 04/08/20 2157 98 %     Weight 04/08/20 2158 240  lb (108.9 kg)     Height 04/08/20 2158 5\' 9"  (1.753 m)     Head Circumference --      Peak Flow --      Pain Score 04/08/20 2158 8     Pain Loc --      Pain Edu? --      Excl. in GC? --    CONSTITUTIONAL: Alert and oriented and responds appropriately to questions. Well-appearing; well-nourished HEAD: Normocephalic EYES: Conjunctivae clear, pupils appear equal, EOM appear intact ENT: normal nose; moist mucous membranes, patient has dental caries noted with pain in the left upper maxillary area without surrounding redness, warmth, drainage or swelling.  No  facial swelling.  Tongue sits flat in the bottom of the mouth.  No sign of Ludwig's angina.  Normal phonation.  No stridor.  No trismus or drooling. NECK: Supple, normal ROM CARD: RRR; S1 and S2 appreciated; no murmurs, no clicks, no rubs, no gallops RESP: Normal chest excursion without splinting or tachypnea; breath sounds clear and equal bilaterally; no wheezes, no rhonchi, no rales, no hypoxia or respiratory distress, speaking full sentences ABD/GI: Normal bowel sounds; non-distended; soft, oddly tender in the right upper quadrant with negative Murphy sign, no rebound, no guarding, no peritoneal signs, no hepatosplenomegaly, no tenderness at McBurney's point GU:  Normal external genitalia, circumcised male, normal penile shaft, no blood or discharge at the urethral meatus.  Patient does have a small nodular mass noted to the right testicle that is mildly tender.  No scrotal masses or swelling, no hernias appreciated, 2+ femoral pulses bilaterally; no perineal erythema, warmth, subcutaneous air or crepitus; no high riding testicle, normal bilateral cremasteric reflex.  Chaperone present for exam. BACK: The back appears normal EXT: Normal ROM in all joints; no deformity noted, no edema; no cyanosis SKIN: Normal color for age and race; warm; no rash on exposed skin NEURO: Moves all extremities equally PSYCH: The patient's mood and manner are appropriate.  ____________________________________________   LABS (all labs ordered are listed, but only abnormal results are displayed)  Labs Reviewed  URINALYSIS, ROUTINE W REFLEX MICROSCOPIC - Abnormal; Notable for the following components:      Result Value   Color, Urine STRAW (*)    APPearance CLEAR (*)    All other components within normal limits  CHLAMYDIA/NGC RT PCR ([facility] ONLY)  CBC  BASIC METABOLIC PANEL  LIPASE, BLOOD  HEPATIC FUNCTION PANEL    ____________________________________________  EKG  None ____________________________________________  RADIOLOGY I, Cheyne Bungert, personally viewed and evaluated these images (plain radiographs) as part of my medical decision making, as well as reviewing the written report by the radiologist.  ED MD interpretation: Gallbladder contracted.  No stones.  Right epididymal cyst.  No torsion.  Official radiology report(s): US SCROTUM W/DOPPLER  Result Date: 04/08/2020 CLINICAL DATA:  Palpable abnormality right scrotum for 1 week EXAM: SCROTAL ULTRASOUND DOPPLER ULTRASOUND OF THE TESTICLES TECHNIQUE: Complete ultrasound examination of the testicles, epididymis, and other scrotal structures was performed. Color and spectral Doppler ultrasound were also utilized to evaluate blood flow to the testicles. COMPARISON:  08/29/2019, 03/01/2015 FINDINGS: Right testicle Measurements: 3.8 x 2.5 x 2.8 cm. No mass or microlithiasis visualized. Left testicle Measurements: 3.2 x 2.6 by 2.5 cm. No mass or microlithiasis visualized. Right epididymis: Stable right epididymal cyst measuring up to 14 mm in size. The remainder of the right epididymis is unremarkable. Left epididymis:  Normal in size and appearance. Hydrocele: Small bilateral hydroceles, slightly more pronounced on the left.  Minimal debris within the hydroceles. Varicocele:  Bilateral varicoceles, right larger than left. Pulsed Doppler interrogation of both testes demonstrates normal low resistance arterial and venous waveforms bilaterally. IMPRESSION: 1. Stable right epididymal cyst, which corresponds to the patient's palpable abnormality. 2. Bilateral varicoceles and hydroceles. 3. Normal testes, with normal Doppler waveforms. Electronically Signed   By: Sharlet Salina M.D.   On: 04/08/2020 23:43   US Abdomen Limited RUQ (LIVER/GB)  Result Date: 04/09/2020 CLINICAL DATA:  Right upper quadrant pain. EXAM: ULTRASOUND ABDOMEN LIMITED RIGHT UPPER QUADRANT  COMPARISON:  Abdominal ultrasound 08/11/2016.  CT 08/29/2019 FINDINGS: Gallbladder: Contracted. No gallstones or wall thickening visualized. No sonographic Murphy sign noted by sonographer. Common bile duct: Diameter: 3 mm, normal. Liver: No focal lesion identified. Mildly increased in parenchymal echogenicity compared to right kidney. Portal vein is patent on color Doppler imaging with normal direction of blood flow towards the liver. Other: No right upper quadrant ascites. IMPRESSION: 1. Contracted gallbladder, also seen on prior CT. This may be due to nonfasting state or can be seen in the setting of chronic gallbladder disease. 2. Mild hepatic steatosis. Electronically Signed   By: Narda Rutherford M.D.   On: 04/09/2020 03:54    ____________________________________________   PROCEDURES  Procedure(s) performed (including Critical Care):  Procedures    ____________________________________________   INITIAL IMPRESSION / ASSESSMENT AND PLAN / ED COURSE  As part of my medical decision making, I reviewed the following data within the electronic MEDICAL RECORD NUMBER Nursing notes reviewed and incorporated, Labs reviewed,, [place] reviewed, Old chart reviewed and Notes from prior ED visits         Patient here with multiple different complaints.  He complains of a left testicular mass that has been present for years but he noticed that it felt like it was bigger than normal and tender over the past few days.  He has no signs of scrotal abscess or cellulitis on exam.  No sign of testicular torsion.  Ultrasound obtained in triage shows a stable right epididymal cyst which corresponds to the patient's palpable abnormality.  He has bilateral varicoceles and hydroceles.  Normal blood flow.  Urinalysis, urine gonorrhea and chlamydia pending.  No other masses or abnormality seen on ultrasound.  He is also complaining of dental pain.  He has dental caries with no obvious abscess.  No sign of Ludwig's angina.   Will place him on prophylactic penicillin and have him follow-up with a dentist as an outpatient.  Also complains of intermittent right upper quadrant abdominal pain.  Labs reassuring here with normal LFTs, lipase.  Normal white count.  No fever.  He denies vomiting or diarrhea.  Will obtain right upper quadrant ultrasound.  He has a negative Murphy sign.  He states he was told once before that this was from pancreatitis however his lipase today is normal.  We will evaluate for cholelithiasis but low suspicion for choledocholithiasis, cholecystitis.  Doubt appendicitis.  ED PROGRESS  Patient's gonorrhea and Chlamydia tests are negative.  Urine shows no sign of infection, blood.  Ultrasound of the gallbladder shows a contracted gallbladder without stones, biliary dilatation, wall thickening, pericholecystic fluid.  Recommended follow-up with a primary care physician, GI, dentist as an outpatient.  Recommend alternating Tylenol and Motrin for pain.  Will discharge on penicillin.   At this time, I do not feel there is any life-threatening condition present. I have reviewed, interpreted and discussed all results (EKG, imaging, lab, urine as appropriate) and exam findings with patient/family. I  have reviewed nursing notes and appropriate previous records.  I feel the patient is safe to be discharged home without further emergent workup and can continue workup as an outpatient as needed. Discussed usual and customary return precautions. Patient/family verbalize understanding and are comfortable with this plan.  Outpatient follow-up has been provided as needed. All questions have been answered.  ____________________________________________   FINAL CLINICAL IMPRESSION(S) / ED DIAGNOSES  Final diagnoses:  RUQ abdominal pain  Dental caries  Epididymal cyst     ED Discharge Orders         Ordered    penicillin v potassium (VEETID) 500 MG tablet  4 times daily        04/09/20 0425           *Please note:  Benjamin Beltran was evaluated in Emergency Department on 04/09/2020 for the symptoms described in the history of present illness. He was evaluated in the context of the global COVID-19 pandemic, which necessitated consideration that the patient might be at risk for infection with the SARS-CoV-2 virus that causes COVID-19. Institutional protocols and algorithms that pertain to the evaluation of patients at risk for COVID-19 are in a state of rapid change based on information released by regulatory bodies including the CDC and federal and state organizations. These policies and algorithms were followed during the patient's care in the ED.  Some ED evaluations and interventions may be delayed as a result of limited staffing during and the pandemic.*   Note:  This document was prepared using Dragon voice recognition software and may include unintentional dictation errors.   Janeice Stegall, Layla Maw, DO 04/09/20 [phone]

## 2020-04-09 NOTE — Discharge Instructions (Signed)
You may alternate Tylenol 1000 mg every 6 hours as needed for pain, fever and Ibuprofen 800 mg every 8 hours as needed for pain, fever.  Please take Ibuprofen with food.  Do not take more than 4000 mg of Tylenol (acetaminophen) in a 24 hour period.   Steps to find a Primary Care Provider (PCP):  Call [phone] or [phone] to access "[facility] Find a Doctor Service."  2.  You may also go on the [facility] website at [email or web address]  

## 2020-06-24 ENCOUNTER — Emergency Department: Payer: Medicaid Other

## 2020-06-24 ENCOUNTER — Other Ambulatory Visit: Payer: Self-pay

## 2020-06-24 ENCOUNTER — Emergency Department
Admission: EM | Admit: 2020-06-24 | Discharge: 2020-06-24 | Disposition: A | Discharge status: Home / Self Care | Payer: Medicaid Other | Attending: Emergency Medicine | Admitting: Emergency Medicine

## 2020-06-24 DIAGNOSIS — Z87891 Personal history of nicotine dependence: Secondary | ICD-10-CM | POA: Insufficient documentation

## 2020-06-24 DIAGNOSIS — S0502XA Injury of conjunctiva and corneal abrasion without foreign body, left eye, initial encounter: Secondary | ICD-10-CM

## 2020-06-24 DIAGNOSIS — Y9389 Activity, other specified: Secondary | ICD-10-CM | POA: Insufficient documentation

## 2020-06-24 DIAGNOSIS — S70312A Abrasion, left thigh, initial encounter: Secondary | ICD-10-CM | POA: Insufficient documentation

## 2020-06-24 DIAGNOSIS — R079 Chest pain, unspecified: Secondary | ICD-10-CM | POA: Insufficient documentation

## 2020-06-24 DIAGNOSIS — W268XXA Contact with other sharp object(s), not elsewhere classified, initial encounter: Secondary | ICD-10-CM | POA: Insufficient documentation

## 2020-06-24 LAB — BASIC METABOLIC PANEL
Anion gap: 7 (ref 5–15)
BUN: 9 mg/dL (ref 6–20)
CO2: 28 mmol/L (ref 22–32)
Calcium: 8.8 mg/dL — ABNORMAL LOW (ref 8.9–10.3)
Chloride: 100 mmol/L (ref 98–111)
Creatinine, Ser: 1.1 mg/dL (ref 0.61–1.24)
GFR, Estimated: >60 mL/min (ref >60)
Glucose, Bld: 122 mg/dL — ABNORMAL HIGH (ref 70–99)
Potassium: 3.4 mmol/L — ABNORMAL LOW (ref 3.5–5.1)
Sodium: 135 mmol/L (ref 135–145)

## 2020-06-24 LAB — CBC
HCT: 38.1 % — ABNORMAL LOW (ref 39.0–52.0)
Hemoglobin: 12.5 g/dL — ABNORMAL LOW (ref 13.0–17.0)
MCH: 28.1 pg (ref 26.0–34.0)
MCHC: 32.8 g/dL (ref 30.0–36.0)
MCV: 85.6 fL (ref 80.0–100.0)
Platelets: 209 10*3/uL (ref 150–400)
RBC: 4.45 MIL/uL (ref 4.22–5.81)
RDW: 12.6 % (ref 11.5–15.5)
WBC: 5 10*3/uL (ref 4.0–10.5)
nRBC: 0 % (ref 0.0–0.2)

## 2020-06-24 LAB — TROPONIN I (HIGH SENSITIVITY)
Troponin I (High Sensitivity): 44 ng/L — ABNORMAL HIGH (ref <18)
Troponin I (High Sensitivity): 40 ng/L — ABNORMAL HIGH (ref <18)

## 2020-06-24 MED ORDER — ERYTHROMYCIN 5 MG/GM OP OINT: 1.0000 "application " | TOPICAL_OINTMENT | Freq: Three times a day (TID) | OPHTHALMIC | 0 refills | Status: AC

## 2020-06-24 MED ORDER — ERYTHROMYCIN 5 MG/GM OP OINT
TOPICAL_OINTMENT | Freq: Once | OPHTHALMIC | Status: AC
  Administered 2020-06-24: 1 via OPHTHALMIC
  Filled 2020-06-24: qty 1

## 2020-06-24 NOTE — Discharge Instructions (Signed)
Please call the number provided for cardiology to arrange a follow-up appointment as soon as possible.  Return to the emergency department for any return of/worsening chest pain, trouble breathing, or any other symptom personally concerning to yourself.

## 2020-06-24 NOTE — ED Notes (Addendum)
Pt ambulatory to ED5 from lobby. Pt reports intermittent L sided CP for several weeks. Denies SOB, n/v.   Pt reports the main reason he came in today was for a paper cut to his L eye. States he was trying on glasses that still had the tag on and when he was taking them off, it cut his eye. Sclera red   Pt AAO NAD VSS. Paduchowski MD at bedside

## 2020-06-24 NOTE — ED Triage Notes (Signed)
Pt to ER via POV with complaints of left sided chest pain that radiates into L arm. States this has been going on for several weeks. Occasional shortness of breath. Reports recent weight gain.   Pt also reports left eye pain, states he was trying on a pair of safety glasses and the tag of the new safety glasses cut his eye lid. States he feels like he can feel the air going through his eyelid and into his eye.

## 2020-06-24 NOTE — ED Provider Notes (Signed)
[facility] Emergency Department Provider Note  Time seen: 11:13 PM  I have reviewed the triage vital signs and the nursing notes.   HISTORY  Chief Complaint Chest Pain and Eye Injury   HPI Benjamin Beltran is a 35 y.o. male with no significant past medical history presents to the emergency department for intermittent chest pains as well as discomfort to his left eye.  According to the patient over the past 6 months or so he has been experiencing intermittent chest pains.  Denies any chest pain currently.  No shortness of breath nausea or diaphoresis.  Denies any substance use.  Denies any personal history of heart disease and is not sure if he has a family history of heart disease.  Patient states today he was trying on safety glasses that had a tag on them still and the tag scratched his eye, which is what prompted today's ER visit.  Patient denies any discharge, states it feels like it is scraped however.  Denies any visual changes.  Past Medical History:  Diagnosis Date  . Elevated liver function tests unk    Patient Active Problem List   Diagnosis Date Noted  . 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 Acute renal failure due to rhabdomyolysis (HCC) 08/11/2016    Past Surgical History:  Procedure Laterality Date  . NECK SURGERY    . tendon surgery to left hand  unk    Prior to Admission medications   Medication Sig Start Date End Date Taking? Authorizing Provider  cyclobenzaprine (FLEXERIL) 5 MG tablet Take 1 or 2 tablets every 8 hours as needed for spasms for up to 5 days 12/22/19   [provider]  methocarbamol (ROBAXIN) 750 MG tablet Take by mouth at bedtime. 12/04/19   [provider]  omeprazole (PRILOSEC) 20 MG capsule Take 1 capsule (20 mg total) by mouth daily. 08/29/19 08/28/20  Chesley Noon, MD  penicillin v potassium (VEETID) 500 MG tablet Take 1 tablet (500 mg total) by mouth 4 (four) times daily. 04/09/20   Ward, Layla Maw, DO   predniSONE (DELTASONE) 20 MG tablet Take 20 mg by mouth 2 (two) times daily. 12/11/19   [provider]    Allergies  Allergen Reactions  . Antihistamines, Diphenhydramine-Type     liver  . Tylenol [Acetaminophen]     Liver problems    No family history on file.  Social History Social History   Tobacco Use  . Smoking status: Former Smoker    Packs/day: 0.50  . Smokeless tobacco: Never Used  Vaping Use  . Vaping Use: Every day  . Devices: 1 cartridge q 2 days  Substance Use Topics  . Alcohol use: No  . Drug use: Not Currently    Types: Marijuana    Review of Systems Constitutional: Negative for fever. Eyes: Left eye discomfort Cardiovascular: Intermittent chest pain times months Respiratory: Negative for shortness of breath. Gastrointestinal: Negative for abdominal pain, vomiting Musculoskeletal: Negative for musculoskeletal complaints Neurological: Negative for headache All other ROS negative  ____________________________________________   PHYSICAL EXAM:  VITAL SIGNS: ED Triage Vitals  Enc Vitals Group     BP 06/24/20 1855 (!) 145/64     Pulse Rate 06/24/20 1855 63     Resp 06/24/20 1855 20     Temp 06/24/20 1855 98.5 F (36.9 C)     Temp Source 06/24/20 1855 Oral     SpO2 06/24/20 1855 100 %     Weight 06/24/20 1856 250 lb (113.4  kg)     Height 06/24/20 1856 5\' 11"  (1.803 m)     Head Circumference --      Peak Flow --      Pain Score 06/24/20 1856 5     Pain Loc --      Pain Edu? --      Excl. in GC? --    Constitutional: Alert and oriented. Well appearing and in no distress. Eyes: Normal exam, no discharge, no scleral injection. ENT      Head: Normocephalic and atraumatic.      Mouth/Throat: Mucous membranes are moist. Cardiovascular: Normal rate, regular rhythm.  Respiratory: Normal respiratory effort without tachypnea nor retractions. Breath sounds are clear Gastrointestinal: Soft and nontender. No distention.  Musculoskeletal:  Nontender with normal range of motion in all extremities.  Neurologic:  Normal speech and language. No gross focal neurologic deficits  Skin:  Skin is warm, dry and intact.  Psychiatric: Mood and affect are normal.   ____________________________________________    EKG  EKG viewed and interpreted by myself shows normal sinus rhythm at 69 bpm with a narrow QRS, normal axis, normal intervals.  Patient does have inferolateral T wave inversions.  These appear present in the patient's prior EKG as well.  ____________________________________________    RADIOLOGY  Chest x-ray is negative  ____________________________________________   INITIAL IMPRESSION / ASSESSMENT AND PLAN / ED COURSE  Pertinent labs & imaging results that were available during my care of the patient were reviewed by me and considered in my medical decision making (see chart for details).   Patient presents to the emergency department for a scrape to his left thigh.  No findings on physical exam however given the discomfort/foreign body sensation we will cover with erythromycin ointment as a precaution for likely scleral abrasion.  Patient does state as a secondary complaint intermittent chest pain x6+ months.  Denies any chest pain currently.  Patient is EKG does show inferolateral T wave inversions which appear abnormal but largely unchanged from prior EKG.  Patient's troponin is slightly elevated at 44 repeat of 40.  I spoke to Dr. 08/24/20 of cardiology given the patient's abnormal EKG and mildly elevated troponin.  He believes patient would be safe for discharge home with outpatient follow-up.  I will provide information for the patient and I have sent a secure message to Dr. Mariah Milling with the patient's information.  Discussed my typical chest pain return precautions.  Patient agreeable to plan.  Benjamin Beltran was evaluated in Emergency Department on 06/24/2020 for the symptoms described in the history of present illness. He  was evaluated in the context of the global COVID-19 pandemic, which necessitated consideration that the patient might be at risk for infection with the SARS-CoV-2 virus that causes COVID-19. Institutional protocols and algorithms that pertain to the evaluation of patients at risk for COVID-19 are in a state of rapid change based on information released by regulatory bodies including the CDC and federal and state organizations. These policies and algorithms were followed during the patient's care in the ED.  ____________________________________________   FINAL CLINICAL IMPRESSION(S) / ED DIAGNOSES  Chest pain Scleral abrasion   08/24/2020, MD 06/24/20 2317

## 2020-07-21 ENCOUNTER — Ambulatory Visit (INDEPENDENT_AMBULATORY_CARE_PROVIDER_SITE_OTHER): Payer: Self-pay | Admitting: Internal Medicine

## 2020-07-21 ENCOUNTER — Other Ambulatory Visit: Payer: Self-pay

## 2020-07-21 ENCOUNTER — Ambulatory Visit (INDEPENDENT_AMBULATORY_CARE_PROVIDER_SITE_OTHER): Payer: Self-pay

## 2020-07-21 ENCOUNTER — Encounter: Payer: Self-pay | Admitting: Internal Medicine

## 2020-07-21 VITALS — BP 110/80 | HR 65 | Ht 69.0 in | Wt 243.0 lb

## 2020-07-21 DIAGNOSIS — R0602 Shortness of breath: Secondary | ICD-10-CM

## 2020-07-21 DIAGNOSIS — R9431 Abnormal electrocardiogram [ECG] [EKG]: Secondary | ICD-10-CM

## 2020-07-21 DIAGNOSIS — R072 Precordial pain: Secondary | ICD-10-CM

## 2020-07-21 DIAGNOSIS — R079 Chest pain, unspecified: Secondary | ICD-10-CM

## 2020-07-21 LAB — ECHOCARDIOGRAM COMPLETE
AR max vel: 3.48 cm2
AV Area VTI: 3.79 cm2
AV Area mean vel: 3.63 cm2
AV Mean grad: 2 mmHg
AV Peak grad: 5.1 mmHg
Ao pk vel: 1.13 m/s
Area-P 1/2: 3.76 cm2
Calc EF: 52.6 %
Height: 69 in
Single Plane A2C EF: 50.6 %
Single Plane A4C EF: 53.9 %
Weight: 3888 oz

## 2020-07-21 MED ORDER — METOPROLOL TARTRATE 25 MG PO TABS: 25.0000 mg | ORAL_TABLET | Freq: Once | ORAL | 3 refills | Status: DC

## 2020-07-21 MED ORDER — ASPIRIN EC 81 MG PO TBEC: 81.0000 mg | DELAYED_RELEASE_TABLET | Freq: Every day | ORAL | Status: DC

## 2020-07-21 NOTE — Progress Notes (Signed)
New Outpatient Visit Date: 07/21/2020  Referring Provider: None  Chief Complaint: Chest pain and shortness of breath  HPI:  Benjamin Beltran is a 35 y.o. male who is being seen today for the evaluation of chest pain and shortness following recent emergency department visit. He has a history of elevated blood pressure and abnormal stress test many years ago.  Over the last few months, he has experienced intermittent shortness of breath most pronounced when he is at rest.  It is not clearly related to position though he does endorse some orthopnea.  He has also experienced chest discomfort that he describes as a "stabbing" pain in the center of his chest.  At times, he feels like there is a hole in his heart.  This typically happens at rest.  When he feels short of breath or has chest pain, he often gets up and exercises vigorously with resolution of the symptoms.  His last episode of chest pain was around the time of his ED visit last month.  Work-up at that time was notable for an abnormal EKG and mildly elevated high-sensitivity troponin I that was flat near 40.  His case was discussed with Dr. Mariah Milling, who recommended outpatient evaluation.  Today, Benjamin Beltran denies further chest pain but still has frequent shortness of breath.  He also has occasional palpitations that are not associated with the aforementioned dyspnea or chest pain.  He has not had any lightheadedness or edema.  He reports having undergone a stress test around age 48 in [place] and told that there was a subtle abnormality along the left side of his heart.  He was also told in the past that he had a heart murmur.  He is concerned that some of his shortness of breath may be related to vaping.  He also endorses use of testosterone in the past to help with bodybuilding.  --------------------------------------------------------------------------------------------------  Cardiovascular History & Procedures: Cardiovascular Problems:  Chest  pain  Shortness of breath  Risk Factors:  Male gender  Cath/PCI:  None  CV Surgery:  None  EP Procedures and Devices:  None  Non-Invasive Evaluation(s):  TTE (07/21/2020): Normal LV size and wall thickness.  LVEF 60-65% with normal wall motion.  GLS -17.2%.  Normal diastolic function.  Normal RV size and function.  No significant valvular abnormality.  Normal CVP.  Recent CV Pertinent Labs: Lab Results  Component Value Date   CHOL 159 08/11/2016   HDL 44 08/11/2016   LDLCALC 101 (H) 08/11/2016   TRIG 71 08/11/2016   CHOLHDL 3.6 08/11/2016   K 3.4 (L) 06/24/2020   MG 1.9 08/11/2016   BUN 9 06/24/2020   CREATININE 1.10 06/24/2020    --------------------------------------------------------------------------------------------------  Past Medical History:  Diagnosis Date  . Elevated blood pressure reading   . Elevated liver function tests unk    Past Surgical History:  Procedure Laterality Date  . NECK SURGERY    . tendon surgery to left hand  unk    Current Meds  Medication Sig  . aspirin EC 81 MG tablet Take 1 tablet (81 mg total) by mouth daily. Swallow whole.  . metoprolol tartrate (LOPRESSOR) 25 MG tablet Take 1 tablet (25 mg total) by mouth once for 1 dose. Prior to cardiac CTA    Allergies: Antihistamines, diphenhydramine-type and Tylenol [acetaminophen]  Social History   Tobacco Use  . Smoking status: Former Smoker    Packs/day: 0.50    Types: Cigarettes  . Smokeless tobacco: Never Used  Vaping Use  .  Vaping Use: Every day  . Devices: 1 cartridge q 2 days  Substance Use Topics  . Alcohol use: No  . Drug use: Not Currently    Types: Marijuana    Comment: occassionally    Family History  Problem Relation Age of Onset  . Diabetes Mother   . Irregular heart beat Mother     Review of Systems: A 12-system review of systems was performed and was negative except as noted in the  HPI.  --------------------------------------------------------------------------------------------------  Physical Exam: BP 110/80 (BP Location: Right Arm, Patient Position: Sitting, Cuff Size: Large)   Pulse 65   Ht 5\' 9"  (1.753 m)   Wt 243 lb (110.2 kg)   SpO2 97%   BMI 35.88 kg/m   General: Muscular man, seated comfortably in the exam room. HEENT: No conjunctival pallor or scleral icterus. Facemask in place. Neck: Supple without lymphadenopathy, thyromegaly, JVD, or HJR. No carotid bruit. Lungs: Normal work of breathing. Clear to auscultation bilaterally without wheezes or crackles. Heart: Regular rate and rhythm without murmurs, rubs, or gallops. Non-displaced PMI. Abd: Bowel sounds present. Soft, NT/ND without hepatosplenomegaly Ext: No lower extremity edema. Radial, PT, and DP pulses are 2+ bilaterally Skin: Warm and dry without rash. Neuro: CNIII-XII intact. Strength and fine-touch sensation intact in upper and lower extremities bilaterally. Psych: Normal mood and affect.  EKG: Normal sinus rhythm with anterolateral and inferior T wave inversions as well as nonspecific ST changes.  Overall appearance is similar to prior tracings back to 09/03/2015.  Lab Results  Component Value Date   WBC 5.0 06/24/2020   HGB 12.5 (L) 06/24/2020   HCT 38.1 (L) 06/24/2020   MCV 85.6 06/24/2020   PLT 209 06/24/2020    Lab Results  Component Value Date   NA 135 06/24/2020   K 3.4 (L) 06/24/2020   CL 100 06/24/2020   CO2 28 06/24/2020   BUN 9 06/24/2020   CREATININE 1.10 06/24/2020   GLUCOSE 122 (H) 06/24/2020   ALT 30 04/08/2020    Lab Results  Component Value Date   CHOL 159 08/11/2016   HDL 44 08/11/2016   LDLCALC 101 (H) 08/11/2016   TRIG 71 08/11/2016   CHOLHDL 3.6 08/11/2016     --------------------------------------------------------------------------------------------------  ASSESSMENT AND PLAN: Shortness of breath, chest pain, and abnormal EKG: Symptoms of been  present for several months, although chest pain has not returned following ED visit in May.  EKG changes are most consistent with abnormal repolarization though there is no significant LVH on today's tracing.  Echocardiogram performed urgently in the office today also does not show any LVH or other significant structural abnormality to explain T wave changes.  Benjamin Beltran seems quite young for ischemic heart disease.  Furthermore, his symptoms are unusual and that they improved with vigorous exercise.  He does have a history of tobacco use (he currently vapes) and testosterone use.  I have recommended that we obtain a coronary CTA to exclude coronary artery disease including anomalous coronary artery.  Pending this evaluation, I have encouraged Benjamin Beltran to limit his exercise regimen and to begin taking aspirin 81 mg daily.  I also advised him to quit vaping.  I advised him to seek immediate medical attention should his symptoms worsen in the meantime.  Follow-up: Return to clinic in 3 to 4 weeks (shortly after completion of coronary CTA).  Kizzie Bane, MD 07/22/2020 3:15 PM

## 2020-07-21 NOTE — Patient Instructions (Addendum)
Medication Instructions:   START Aspirin 81mg  daily - you may get this over the counter (we are providing samples today)  *If you need a refill on your cardiac medications before your next appointment, please call your pharmacy*   Lab Work:  None ordered  Testing/Procedures:  Your physician has requested that you have an echocardiogram. We will do this today in the office. Echocardiography is a painless test that uses sound waves to create images of your heart. It provides your doctor with information about the size and shape of your heart and how well your heart's chambers and valves are working. This procedure takes approximately one hour. There are no restrictions for this procedure.  Your cardiac CT will be scheduled at one of the below locations:   [facility] [address] [place], [place] [phone]  OR  [facility] [address] [place], [place] [phone]  If scheduled at [facility], please arrive at the [facility] main entrance (entrance A) of [facility] 30 minutes prior to test start time. Proceed to the [facility] Radiology Department (first floor) to check-in and test prep.  If scheduled at [facility], please arrive 15 mins early for check-in and test prep.  Please follow these instructions carefully (unless otherwise directed):  Hold all erectile dysfunction medications at least 3 days (72 hrs) prior to test.  On the Night Before the Test: . Be sure to Drink plenty of water. . Do not consume any caffeinated/decaffeinated beverages or chocolate 12 hours prior to your test. . Do not take any antihistamines 12 hours prior to your test.  On the Day of the Test: . Drink plenty of water until 1 hour prior to the test. . Do not eat any food 4 hours prior to the test. . You may take your regular medications prior to the test.  . Take  metoprolol (Lopressor) two hours prior to test. . HOLD Furosemide/Hydrochlorothiazide morning of the test.      After the Test: . Drink plenty of water. . After receiving IV contrast, you may experience a mild flushed feeling. This is normal. . On occasion, you may experience a mild rash up to 24 hours after the test. This is not dangerous. If this occurs, you can take Benadryl 25 mg and increase your fluid intake. . If you experience trouble breathing, this can be serious. If it is severe call 911 IMMEDIATELY. If it is mild, please call our office. . If you take any of these medications: Glipizide/Metformin, Avandament, Glucavance, please do not take 48 hours after completing test unless otherwise instructed.   Once we have confirmed authorization from your insurance company, we will call you to set up a date and time for your test. Based on how quickly your insurance processes prior authorizations requests, please allow up to 4 weeks to be contacted for scheduling your Cardiac CT appointment. Be advised that routine Cardiac CT appointments could be scheduled as many as 8 weeks after your provider has ordered it.  For non-scheduling related questions, please contact the cardiac imaging nurse navigator should you have any questions/concerns: [facility], Cardiac Imaging Nurse Navigator [place], Cardiac Imaging Nurse Navigator [facility] Direct Office Dial: [phone]   For scheduling needs, including cancellations and rescheduling, please call [phone], [phone].  Follow-Up: At [facility], you and your health needs are our priority.  As part of our  continuing mission to provide you with exceptional heart care, we have created designated Provider Care Teams.  These Care Teams include your primary Cardiologist (physician) and Advanced Practice Providers (APPs -  Physician Assistants and Nurse Practitioners) who all work together to provide you with  the care you need, when you need it.  We recommend signing up for the patient portal called "MyChart".  Sign up information is provided on this After Visit Summary.  MyChart is used to connect with patients for Virtual Visits (Telemedicine).  Patients are able to view lab/test results, encounter notes, upcoming appointments, etc.  Non-urgent messages can be sent to your provider as well.   To learn more about what you can do with MyChart, go to [email or web address].    Your next appointment:   3 - 4 week(s)  The format for your next appointment:   In Person  Provider:   You may see Dr. Cristal Deer End or one of the following Advanced Practice Providers on your designated Care Team:    Nicolasa Ducking, NP  Eula Listen, PA-C  Marisue Ivan, PA-C  Cadence Fransico Michael, [place]  Gillian Shields, NP   Other instructions  If you feel worse after today's visit, please go to the Emergency Room

## 2020-07-22 ENCOUNTER — Telehealth: Payer: Self-pay | Admitting: Licensed Clinical Social Worker

## 2020-07-22 ENCOUNTER — Encounter: Payer: Self-pay | Admitting: Internal Medicine

## 2020-07-22 DIAGNOSIS — R9431 Abnormal electrocardiogram [ECG] [EKG]: Secondary | ICD-10-CM | POA: Insufficient documentation

## 2020-07-22 DIAGNOSIS — R0602 Shortness of breath: Secondary | ICD-10-CM | POA: Insufficient documentation

## 2020-07-22 DIAGNOSIS — R072 Precordial pain: Secondary | ICD-10-CM | POA: Insufficient documentation

## 2020-07-22 NOTE — Progress Notes (Signed)
Heart and Vascular Care Navigation  07/22/2020  [place] 11-13-1985 [id number]  Reason for Referral: Patient referred due to financial concerns.   Engaged with patient by telephone for initial visit for Heart and Vascular Care Coordination.                                                                                                   Assessment:  Patient is a 35yo male who is married with 2 children (17yo lives with other family and 12yo daughter resides with patient). He reports he has had multiple injuries and has been on workers comp 2 x over the past few years.  Patient reports continued issues but no longer on workers comp and currently has medicaid family planning.   An upcoming surgery scheduled. Patient states he has had a "partial disability for sometime" but has never applied for SSD.   Patient has no current income or insurance. He denies any concerns with transportation, housing but states concerns with finances and food.                              HRT/VAS Care Coordination    Patients Home Cardiology Office [facility]   Outpatient Care Team Social Worker   Social Worker Name: Lasandra Beech, [place] [phone]   Living arrangements for the past 2 months Single Family Home   Lives with: Spouse; Minor Children  12yo daughter   Patient Current Insurance Coverage Self-Pay  Medicaid Family PLanning   Patient Has Concern With Paying Medical Bills Yes   Does Patient Have Prescription Coverage? No   Home Assistive Devices/Equipment None      Social History:                                                                             SDOH Screenings   Alcohol Screen: Not on file  Depression (OZD6-6): Not on file  Financial Resource Strain: High Risk  . Difficulty of Paying Living Expenses: Hard  Food Insecurity: Food Insecurity Present  . Worried About Programme researcher, broadcasting/film/video in the Last Year: Sometimes true  . Ran Out of Food in the Last Year: Sometimes true   Housing: Low Risk   . Last Housing Risk Score: 0  Physical Activity: Not on file  Social Connections: Not on file  Stress: Not on file  Tobacco Use: Medium Risk  . Smoking Tobacco Use: Former Smoker  . Smokeless Tobacco Use: Never Used  Transportation Needs: No Transportation Needs  . Lack of Transportation (Medical): No  . Lack of Transportation (Non-Medical): No    SDOH Interventions: Financial Resources:  Financial Strain Interventions: Other Engineer, site (Social Security for Financial controller) Editor, commissioning for [employer] Program  Food Insecurity:  Food Insecurity Interventions: Other (Comment) (Provided resources fo food banks)  Housing Insecurity:  Housing Interventions: Intervention Not Indicated  Transportation:   Transportation Interventions: Intervention Not Indicated   Follow-up plan: CSW provided resources for local food banks and discussed process for applying for food stamps. CSW also provided information on disability application process with Social Security and will mail patient a CAFA application to complete and return. Patient verbalizes understanding of follow up needed and will call CSW if further needs arise. Lasandra Beech, LCSW, CCSW-MCS [phone]

## 2020-07-22 NOTE — Telephone Encounter (Signed)
CSW received referral from the [place] office to assist patient with some financial resources. CSW attempted to reach patient by phone although left message for return call. Lasandra Beech, LCSW, CCSW-MCS [phone]

## 2020-07-26 ENCOUNTER — Other Ambulatory Visit: Payer: Self-pay

## 2020-07-26 MED ORDER — METOPROLOL TARTRATE 25 MG PO TABS: 25.0000 mg | ORAL_TABLET | Freq: Once | ORAL | 0 refills | Status: DC

## 2020-08-19 ENCOUNTER — Other Ambulatory Visit: Payer: Self-pay

## 2020-08-19 ENCOUNTER — Emergency Department: Payer: Self-pay

## 2020-08-19 DIAGNOSIS — I200 Unstable angina: Secondary | ICD-10-CM | POA: Diagnosis present

## 2020-08-19 DIAGNOSIS — E785 Hyperlipidemia, unspecified: Secondary | ICD-10-CM | POA: Diagnosis present

## 2020-08-19 DIAGNOSIS — Z79899 Other long term (current) drug therapy: Secondary | ICD-10-CM

## 2020-08-19 DIAGNOSIS — Z888 Allergy status to other drugs, medicaments and biological substances status: Secondary | ICD-10-CM

## 2020-08-19 DIAGNOSIS — Z716 Tobacco abuse counseling: Secondary | ICD-10-CM

## 2020-08-19 DIAGNOSIS — I214 Non-ST elevation (NSTEMI) myocardial infarction: Principal | ICD-10-CM | POA: Diagnosis present

## 2020-08-19 DIAGNOSIS — Z7982 Long term (current) use of aspirin: Secondary | ICD-10-CM

## 2020-08-19 DIAGNOSIS — E876 Hypokalemia: Secondary | ICD-10-CM | POA: Diagnosis present

## 2020-08-19 DIAGNOSIS — Z886 Allergy status to analgesic agent status: Secondary | ICD-10-CM

## 2020-08-19 DIAGNOSIS — I10 Essential (primary) hypertension: Secondary | ICD-10-CM | POA: Diagnosis present

## 2020-08-19 DIAGNOSIS — Z20822 Contact with and (suspected) exposure to covid-19: Secondary | ICD-10-CM | POA: Diagnosis present

## 2020-08-19 DIAGNOSIS — F17290 Nicotine dependence, other tobacco product, uncomplicated: Secondary | ICD-10-CM | POA: Diagnosis present

## 2020-08-19 LAB — CBC WITH DIFFERENTIAL/PLATELET
Abs Immature Granulocytes: 0.02 10*3/uL (ref 0.00–0.07)
Basophils Absolute: 0 10*3/uL (ref 0.0–0.1)
Basophils Relative: 0 %
Eosinophils Absolute: 0.2 10*3/uL (ref 0.0–0.5)
Eosinophils Relative: 2 %
HCT: 40.1 % (ref 39.0–52.0)
Hemoglobin: 13.2 g/dL (ref 13.0–17.0)
Immature Granulocytes: 0 %
Lymphocytes Relative: 33 %
Lymphs Abs: 2.5 10*3/uL (ref 0.7–4.0)
MCH: 28.1 pg (ref 26.0–34.0)
MCHC: 32.9 g/dL (ref 30.0–36.0)
MCV: 85.5 fL (ref 80.0–100.0)
Monocytes Absolute: 0.8 10*3/uL (ref 0.1–1.0)
Monocytes Relative: 11 %
Neutro Abs: 4.1 10*3/uL (ref 1.7–7.7)
Neutrophils Relative %: 54 %
Platelets: 221 10*3/uL (ref 150–400)
RBC: 4.69 MIL/uL (ref 4.22–5.81)
RDW: 13.2 % (ref 11.5–15.5)
WBC: 7.7 10*3/uL (ref 4.0–10.5)
nRBC: 0 % (ref 0.0–0.2)

## 2020-08-19 NOTE — ED Triage Notes (Signed)
Pt in with co chest pain states feels like tightness. Hx of the same, was seen here 3 weeks ago and admitted. Pt unsure of dx, has apptm pending with cardiology. Pt also co shob, none noted at this time.

## 2020-08-20 ENCOUNTER — Observation Stay: Payer: Self-pay

## 2020-08-20 ENCOUNTER — Encounter: Payer: Self-pay | Admitting: Family Medicine

## 2020-08-20 ENCOUNTER — Inpatient Hospital Stay
Admission: EM | Admit: 2020-08-20 | Discharge: 2020-08-21 | DRG: 282 | Disposition: A | Discharge status: Home / Self Care | Payer: Self-pay | Attending: Internal Medicine | Admitting: Internal Medicine

## 2020-08-20 ENCOUNTER — Ambulatory Visit: Payer: Medicaid Other | Admitting: Physician Assistant

## 2020-08-20 ENCOUNTER — Encounter
Admission: EM | Disposition: A | Discharge status: Home / Self Care | Payer: Self-pay | Source: Home / Self Care | Attending: Internal Medicine

## 2020-08-20 DIAGNOSIS — Z716 Tobacco abuse counseling: Secondary | ICD-10-CM

## 2020-08-20 DIAGNOSIS — I214 Non-ST elevation (NSTEMI) myocardial infarction: Secondary | ICD-10-CM

## 2020-08-20 DIAGNOSIS — E785 Hyperlipidemia, unspecified: Secondary | ICD-10-CM

## 2020-08-20 DIAGNOSIS — I200 Unstable angina: Secondary | ICD-10-CM

## 2020-08-20 DIAGNOSIS — R079 Chest pain, unspecified: Secondary | ICD-10-CM

## 2020-08-20 DIAGNOSIS — R7989 Other specified abnormal findings of blood chemistry: Secondary | ICD-10-CM

## 2020-08-20 DIAGNOSIS — R52 Pain, unspecified: Secondary | ICD-10-CM

## 2020-08-20 DIAGNOSIS — I10 Essential (primary) hypertension: Secondary | ICD-10-CM

## 2020-08-20 DIAGNOSIS — E876 Hypokalemia: Secondary | ICD-10-CM

## 2020-08-20 DIAGNOSIS — I2510 Atherosclerotic heart disease of native coronary artery without angina pectoris: Secondary | ICD-10-CM

## 2020-08-20 HISTORY — PX: LEFT HEART CATH AND CORONARY ANGIOGRAPHY: CATH118249

## 2020-08-20 HISTORY — DX: Rhabdomyolysis: M62.82

## 2020-08-20 HISTORY — DX: Chest pain, unspecified: R07.9

## 2020-08-20 HISTORY — DX: Essential (primary) hypertension: I10

## 2020-08-20 HISTORY — DX: Tobacco use: Z72.0

## 2020-08-20 LAB — HEPATIC FUNCTION PANEL
ALT: 71 U/L — ABNORMAL HIGH (ref 0–44)
AST: 110 U/L — ABNORMAL HIGH (ref 15–41)
Albumin: 3.7 g/dL (ref 3.5–5.0)
Alkaline Phosphatase: 39 U/L (ref 38–126)
Bilirubin, Direct: <0.1 mg/dL (ref 0.0–0.2)
Total Bilirubin: 0.7 mg/dL (ref 0.3–1.2)
Total Protein: 6.6 g/dL (ref 6.5–8.1)

## 2020-08-20 LAB — BASIC METABOLIC PANEL
Anion gap: 7 (ref 5–15)
BUN: 10 mg/dL (ref 6–20)
CO2: 27 mmol/L (ref 22–32)
Calcium: 8.5 mg/dL — ABNORMAL LOW (ref 8.9–10.3)
Chloride: 104 mmol/L (ref 98–111)
Creatinine, Ser: 1.11 mg/dL (ref 0.61–1.24)
GFR, Estimated: >60 mL/min (ref >60)
Glucose, Bld: 100 mg/dL — ABNORMAL HIGH (ref 70–99)
Potassium: 3.6 mmol/L (ref 3.5–5.1)
Sodium: 138 mmol/L (ref 135–145)

## 2020-08-20 LAB — LIPID PANEL
Cholesterol: 167 mg/dL (ref 0–200)
HDL: 40 mg/dL — ABNORMAL LOW (ref >40)
LDL Cholesterol: 118 mg/dL — ABNORMAL HIGH (ref 0–99)
Total CHOL/HDL Ratio: 4.2 RATIO
Triglycerides: 46 mg/dL (ref <150)
VLDL: 9 mg/dL (ref 0–40)

## 2020-08-20 LAB — APTT: aPTT: 24 seconds (ref 24–36)

## 2020-08-20 LAB — COMPREHENSIVE METABOLIC PANEL
ALT: 77 U/L — ABNORMAL HIGH (ref 0–44)
AST: 107 U/L — ABNORMAL HIGH (ref 15–41)
Albumin: 4 g/dL (ref 3.5–5.0)
Alkaline Phosphatase: 45 U/L (ref 38–126)
Anion gap: 7 (ref 5–15)
BUN: 13 mg/dL (ref 6–20)
CO2: 26 mmol/L (ref 22–32)
Calcium: 8.9 mg/dL (ref 8.9–10.3)
Chloride: 103 mmol/L (ref 98–111)
Creatinine, Ser: 1.15 mg/dL (ref 0.61–1.24)
GFR, Estimated: >60 mL/min (ref >60)
Glucose, Bld: 137 mg/dL — ABNORMAL HIGH (ref 70–99)
Potassium: 3.2 mmol/L — ABNORMAL LOW (ref 3.5–5.1)
Sodium: 136 mmol/L (ref 135–145)
Total Bilirubin: 0.6 mg/dL (ref 0.3–1.2)
Total Protein: 7.3 g/dL (ref 6.5–8.1)

## 2020-08-20 LAB — TROPONIN I (HIGH SENSITIVITY)
Troponin I (High Sensitivity): 115 ng/L (ref <18)
Troponin I (High Sensitivity): 203 ng/L (ref <18)
Troponin I (High Sensitivity): 2087 ng/L (ref <18)

## 2020-08-20 LAB — PROTIME-INR
INR: 1 (ref 0.8–1.2)
Prothrombin Time: 13.3 seconds (ref 11.4–15.2)

## 2020-08-20 LAB — HEPARIN LEVEL (UNFRACTIONATED)
Heparin Unfractionated: 0.44 IU/mL (ref 0.30–0.70)
Heparin Unfractionated: 0.58 IU/mL (ref 0.30–0.70)

## 2020-08-20 LAB — CBC
HCT: 38.4 % — ABNORMAL LOW (ref 39.0–52.0)
Hemoglobin: 12.7 g/dL — ABNORMAL LOW (ref 13.0–17.0)
MCH: 28.1 pg (ref 26.0–34.0)
MCHC: 33.1 g/dL (ref 30.0–36.0)
MCV: 85 fL (ref 80.0–100.0)
Platelets: 222 10*3/uL (ref 150–400)
RBC: 4.52 MIL/uL (ref 4.22–5.81)
RDW: 13.2 % (ref 11.5–15.5)
WBC: 5.6 10*3/uL (ref 4.0–10.5)
nRBC: 0 % (ref 0.0–0.2)

## 2020-08-20 LAB — HIV ANTIBODY (ROUTINE TESTING W REFLEX): HIV Screen 4th Generation wRfx: NONREACTIVE

## 2020-08-20 LAB — RESP PANEL BY RT-PCR (FLU A&B, COVID) ARPGX2
Influenza A by PCR: NEGATIVE
Influenza B by PCR: NEGATIVE
SARS Coronavirus 2 by RT PCR: NEGATIVE

## 2020-08-20 LAB — MAGNESIUM: Magnesium: 2.1 mg/dL (ref 1.7–2.4)

## 2020-08-20 SURGERY — LEFT HEART CATH AND CORONARY ANGIOGRAPHY
Anesthesia: Moderate Sedation

## 2020-08-20 MED ORDER — ATORVASTATIN CALCIUM 20 MG PO TABS
40.0000 mg | ORAL_TABLET | Freq: Every day | ORAL | Status: DC
  Filled 2020-08-20: qty 2

## 2020-08-20 MED ORDER — ENOXAPARIN SODIUM 40 MG/0.4ML IJ SOSY: 40.0000 mg | PREFILLED_SYRINGE | INTRAMUSCULAR | Status: DC

## 2020-08-20 MED ORDER — SODIUM CHLORIDE 0.9 % WEIGHT BASED INFUSION: 3.0000 mL/kg/h | INTRAVENOUS | Status: AC

## 2020-08-20 MED ORDER — SODIUM CHLORIDE 0.9 % IV SOLN: 250.0000 mL | INTRAVENOUS | Status: DC | PRN

## 2020-08-20 MED ORDER — HEPARIN (PORCINE) IN NACL 1000-0.9 UT/500ML-% IV SOLN
INTRAVENOUS | Status: DC | PRN
  Administered 2020-08-20 (×2): 500 mL

## 2020-08-20 MED ORDER — HEPARIN (PORCINE) 25000 UT/250ML-% IV SOLN: 14.0000 [IU]/kg/h | INTRAVENOUS | Status: DC

## 2020-08-20 MED ORDER — MIDAZOLAM HCL 2 MG/2ML IJ SOLN
INTRAMUSCULAR | Status: DC | PRN
Start: 2020-08-20 — End: 2020-08-20
  Administered 2020-08-20: 2 mg via INTRAVENOUS

## 2020-08-20 MED ORDER — LIDOCAINE HCL (PF) 1 % IJ SOLN
INTRAMUSCULAR | Status: AC
  Filled 2020-08-20: qty 30

## 2020-08-20 MED ORDER — HEPARIN SODIUM (PORCINE) 1000 UNIT/ML IJ SOLN
INTRAMUSCULAR | Status: DC | PRN
  Administered 2020-08-20: 5000 [IU] via INTRAVENOUS

## 2020-08-20 MED ORDER — SODIUM CHLORIDE 0.9% FLUSH: 3.0000 mL | Freq: Two times a day (BID) | INTRAVENOUS | Status: DC

## 2020-08-20 MED ORDER — AMLODIPINE BESYLATE 5 MG PO TABS
5.0000 mg | ORAL_TABLET | Freq: Every day | ORAL | Status: DC
  Filled 2020-08-20: qty 1

## 2020-08-20 MED ORDER — ZOLPIDEM TARTRATE 5 MG PO TABS
5.0000 mg | ORAL_TABLET | Freq: Every evening | ORAL | Status: DC | PRN
  Administered 2020-08-20: 5 mg via ORAL
  Filled 2020-08-20: qty 1

## 2020-08-20 MED ORDER — ATORVASTATIN CALCIUM 20 MG PO TABS: 80.0000 mg | ORAL_TABLET | Freq: Every day | ORAL | Status: DC

## 2020-08-20 MED ORDER — METOPROLOL TARTRATE 25 MG PO TABS: 12.5000 mg | ORAL_TABLET | Freq: Two times a day (BID) | ORAL | Status: DC

## 2020-08-20 MED ORDER — HEPARIN SODIUM (PORCINE) 5000 UNIT/ML IJ SOLN: 4000.0000 [IU] | Freq: Once | INTRAMUSCULAR | Status: DC

## 2020-08-20 MED ORDER — ASPIRIN 81 MG PO CHEW: 324.0000 mg | CHEWABLE_TABLET | ORAL | Status: AC

## 2020-08-20 MED ORDER — VERAPAMIL HCL 2.5 MG/ML IV SOLN
INTRAVENOUS | Status: DC | PRN
  Administered 2020-08-20: 2.5 mg via INTRA_ARTERIAL

## 2020-08-20 MED ORDER — SODIUM CHLORIDE 0.9% FLUSH: 3.0000 mL | INTRAVENOUS | Status: DC | PRN

## 2020-08-20 MED ORDER — SODIUM CHLORIDE 0.9% FLUSH
3.0000 mL | Freq: Two times a day (BID) | INTRAVENOUS | Status: DC
  Administered 2020-08-20: 3 mL via INTRAVENOUS

## 2020-08-20 MED ORDER — MIDAZOLAM HCL 2 MG/2ML IJ SOLN
INTRAMUSCULAR | Status: AC
  Filled 2020-08-20: qty 2

## 2020-08-20 MED ORDER — NITROGLYCERIN 2 % TD OINT
1.0000 [in_us] | TOPICAL_OINTMENT | Freq: Once | TRANSDERMAL | Status: AC
  Administered 2020-08-20: 1 [in_us] via TOPICAL
  Filled 2020-08-20: qty 1

## 2020-08-20 MED ORDER — HEPARIN BOLUS VIA INFUSION
4000.0000 [IU] | Freq: Once | INTRAVENOUS | Status: AC
  Administered 2020-08-20: 4000 [IU] via INTRAVENOUS
  Filled 2020-08-20: qty 4000

## 2020-08-20 MED ORDER — NITROGLYCERIN 0.4 MG SL SUBL: 0.4000 mg | SUBLINGUAL_TABLET | SUBLINGUAL | Status: DC | PRN

## 2020-08-20 MED ORDER — SODIUM CHLORIDE 0.9 % IV SOLN: INTRAVENOUS | Status: DC

## 2020-08-20 MED ORDER — POTASSIUM CHLORIDE CRYS ER 20 MEQ PO TBCR
40.0000 meq | EXTENDED_RELEASE_TABLET | Freq: Once | ORAL | Status: AC
  Administered 2020-08-20: 40 meq via ORAL
  Filled 2020-08-20: qty 2

## 2020-08-20 MED ORDER — ASPIRIN EC 81 MG PO TBEC: 81.0000 mg | DELAYED_RELEASE_TABLET | Freq: Every day | ORAL | Status: DC

## 2020-08-20 MED ORDER — LIDOCAINE HCL (PF) 1 % IJ SOLN
INTRAMUSCULAR | Status: DC | PRN
  Administered 2020-08-20: 2 mL

## 2020-08-20 MED ORDER — FENTANYL CITRATE (PF) 100 MCG/2ML IJ SOLN
INTRAMUSCULAR | Status: AC
  Filled 2020-08-20: qty 2

## 2020-08-20 MED ORDER — CLOPIDOGREL BISULFATE 75 MG PO TABS
75.0000 mg | ORAL_TABLET | Freq: Every day | ORAL | Status: DC
  Administered 2020-08-21: 75 mg via ORAL
  Filled 2020-08-20: qty 1

## 2020-08-20 MED ORDER — IOHEXOL 350 MG/ML SOLN
100.0000 mL | Freq: Once | INTRAVENOUS | Status: AC | PRN
  Administered 2020-08-20: 100 mL via INTRAVENOUS

## 2020-08-20 MED ORDER — HEPARIN (PORCINE) 25000 UT/250ML-% IV SOLN
1350.0000 [IU]/h | INTRAVENOUS | Status: DC
  Administered 2020-08-20: 1350 [IU]/h via INTRAVENOUS
  Filled 2020-08-20: qty 250

## 2020-08-20 MED ORDER — ADULT MULTIVITAMIN W/MINERALS CH
1.0000 | ORAL_TABLET | Freq: Every day | ORAL | Status: DC
  Administered 2020-08-21: 1 via ORAL
  Filled 2020-08-20: qty 1

## 2020-08-20 MED ORDER — HEPARIN SODIUM (PORCINE) 1000 UNIT/ML IJ SOLN
INTRAMUSCULAR | Status: AC
  Filled 2020-08-20: qty 1

## 2020-08-20 MED ORDER — MORPHINE SULFATE (PF) 2 MG/ML IV SOLN
2.0000 mg | INTRAVENOUS | Status: DC | PRN
  Administered 2020-08-20 (×2): 2 mg via INTRAVENOUS
  Filled 2020-08-20 (×2): qty 1

## 2020-08-20 MED ORDER — SODIUM CHLORIDE 0.9 % IV SOLN: INTRAVENOUS | Status: AC

## 2020-08-20 MED ORDER — VERAPAMIL HCL 2.5 MG/ML IV SOLN
INTRAVENOUS | Status: AC
  Filled 2020-08-20: qty 2

## 2020-08-20 MED ORDER — ASPIRIN 300 MG RE SUPP: 300.0000 mg | RECTAL | Status: DC

## 2020-08-20 MED ORDER — ASPIRIN 81 MG PO CHEW
81.0000 mg | CHEWABLE_TABLET | Freq: Every day | ORAL | Status: DC
  Administered 2020-08-21: 81 mg via ORAL
  Filled 2020-08-20: qty 1

## 2020-08-20 MED ORDER — POTASSIUM CHLORIDE 20 MEQ PO PACK: 40.0000 meq | PACK | Freq: Once | ORAL | Status: DC

## 2020-08-20 MED ORDER — ONDANSETRON HCL 4 MG/2ML IJ SOLN: 4.0000 mg | Freq: Four times a day (QID) | INTRAMUSCULAR | Status: DC | PRN

## 2020-08-20 MED ORDER — SODIUM CHLORIDE 0.9 % WEIGHT BASED INFUSION: 1.0000 mL/kg/h | INTRAVENOUS | Status: DC

## 2020-08-20 MED ORDER — HEPARIN (PORCINE) IN NACL 1000-0.9 UT/500ML-% IV SOLN
INTRAVENOUS | Status: AC
  Filled 2020-08-20: qty 1000

## 2020-08-20 MED ORDER — METOPROLOL TARTRATE 25 MG PO TABS
12.5000 mg | ORAL_TABLET | Freq: Two times a day (BID) | ORAL | Status: DC
  Administered 2020-08-20 (×2): 12.5 mg via ORAL
  Filled 2020-08-20 (×2): qty 1

## 2020-08-20 MED ORDER — IOHEXOL 300 MG/ML  SOLN
INTRAMUSCULAR | Status: DC | PRN
  Administered 2020-08-20: 56 mL

## 2020-08-20 MED ORDER — ALPRAZOLAM 0.25 MG PO TABS
0.2500 mg | ORAL_TABLET | Freq: Two times a day (BID) | ORAL | Status: DC | PRN
  Administered 2020-08-20: 0.25 mg via ORAL
  Filled 2020-08-20: qty 1

## 2020-08-20 MED ORDER — METOPROLOL TARTRATE 25 MG PO TABS: 25.0000 mg | ORAL_TABLET | Freq: Once | ORAL | Status: DC

## 2020-08-20 MED ORDER — ASPIRIN EC 325 MG PO TBEC: 325.0000 mg | DELAYED_RELEASE_TABLET | Freq: Every day | ORAL | Status: DC

## 2020-08-20 MED ORDER — ASPIRIN 81 MG PO CHEW
324.0000 mg | CHEWABLE_TABLET | Freq: Once | ORAL | Status: AC
  Administered 2020-08-20: 324 mg via ORAL
  Filled 2020-08-20: qty 4

## 2020-08-20 SURGICAL SUPPLY — 11 items
CATH INFINITI 5FR JK (CATHETERS) ×1 IMPLANT
DEVICE RAD TR BAND REGULAR (VASCULAR PRODUCTS) ×1 IMPLANT
DRAPE BRACHIAL (DRAPES) ×1 IMPLANT
GLIDESHEATH SLEND SS 6F .021 (SHEATH) ×1 IMPLANT
KIT SYRINGE INJ CVI SPIKEX1 (MISCELLANEOUS) ×1 IMPLANT
PACK CARDIAC CATH (CUSTOM PROCEDURE TRAY) ×2 IMPLANT
PROTECTION STATION PRESSURIZED (MISCELLANEOUS) ×2
SET ATX SIMPLICITY (MISCELLANEOUS) ×1 IMPLANT
STATION PROTECTION PRESSURIZED (MISCELLANEOUS) IMPLANT
WIRE GUIDERIGHT .035X150 (WIRE) ×1 IMPLANT
WIRE HITORQ VERSACORE ST 145CM (WIRE) ×1 IMPLANT

## 2020-08-20 NOTE — Progress Notes (Signed)
Patient ID: Benjamin Beltran, male   DOB: Oct 26, 1985, 35 y.o.   MRN: [id number]  patient examining chart reviewed. Discussed with patient and wife in the room. Comes in with chest pain ongoing for few days. Was evaluated the emergency room on June 1 for similar symptoms. Ruled in for non-STEMI. Cardiology consultation with Dr. Kirke Corin. Cardiac cath plan for today. Will await further cardiac recommendations pending cath results.  discussed with patient regarding cessation of vaping alcohol and marijuana.

## 2020-08-20 NOTE — Consult Note (Signed)
Cardiology Consult    Patient ID: [name] MRN: [id number], DOB/AGE: August 20, 1985   Admit date: 08/20/2020 Date of Consult: 08/20/2020  Primary Physician: Patient, No Pcp Per (Inactive) Primary Cardiologist: Yvonne Kendall, MD Requesting Provider: Kathie Rhodes. Allena Katz, MD  Patient Profile    [name] is a 35 y.o. male with a history of untreated HTN, tob/vape use, abnormal LFTs, and chest pain, who is being seen today for the evaluation of chest pain/NSTEMI at the request of Dr. Allena Katz.  Past Medical History   Past Medical History:  Diagnosis Date   Chest pain    a. 07/2020 Echo: EF 60-65%, no rwma, nl RV fxn. No significant valvular dzs.   Elevated liver function tests unk   Essential hypertension    Rhabdomyolysis    Tobacco abuse    Vapes nicotine containing substance     Past Surgical History:  Procedure Laterality Date   NECK SURGERY     tendon surgery to left hand  unk     Allergies  Allergies  Allergen Reactions   Antihistamines, Diphenhydramine-Type     liver   Tylenol [Acetaminophen]     Liver problems    History of Present Illness    35 year old male with the above past medical history including chest pain, elevated liver enzymes, hypertension (untreated), tobacco abuse, vaping, and occasional marijuana use.  He lives locally with his wife and children.  He exercises regularly/body builds.  He was previous evaluated by Dr. Okey Dupre on June 1 in the setting of a history of sharp and stabbing central chest pain, typically occurring at rest, and improved with activity potentially including vigorous exercise.  He was previously evaluated in the ED for similar symptoms with a mildly elevated high-sensitivity troponin with flat trend (40).  EKG at the time of office visit showed mild ST elevation in leads I and aVL with inferior and anterolateral T wave inversion.  Echo was performed in the office which showed normal LV function without regional wall motion abnormalities,  and without any significant valvular disease.  Recommendation was made for initiation of aspirin therapy and coronary CT angiography.  Coronary CTA was scheduled for July 7.  Patient says that he did well following his June 1 visit without any recurrence of chest pain.  He did smoke some marijuana and drank some alcohol on June 29.  On June 30, he worked out fairly heavily, saying that he squatted 500 pounds, which she had not done in a while.  He did not have any symptoms or limitations during exercise.  Later in the evening, he was working, [employer], [employer], when he had sudden onset of substernal chest heaviness and pressure associated with mild dyspnea and lightheadedness.  This was different than previous symptoms.  He sat down and rested for about 15 minutes but when symptoms did not abate, he had his wife drive him to the emergency department.  Here, he was hypertensive at 146/92.  Potassium was 3.2.  He had mild LFT abnormalities with an AST of 107 and ALT of 77.  Initial high-sensitivity troponin was elevated at 115 with subsequent rise to 203, and then 2087 this morning.  Admission ECG was similar to outpatient ECG.  In the ED, chest pressure persisted for several hours and eventually abated after sublingual nitroglycerin.  He is currently chest pain-free and on heparin.  Inpatient Medications     aspirin  324 mg Oral NOW   Or   aspirin  300 mg Rectal NOW   [  START ON 08/21/2020] aspirin EC  325 mg Oral Daily   atorvastatin  40 mg Oral Daily   metoprolol tartrate  12.5 mg Oral BID   multivitamin with minerals  1 tablet Oral Daily   potassium chloride  40 mEq Oral Once    Family History    Family History  Problem Relation Age of Onset   Diabetes Mother    Irregular heart beat Mother    He indicated that his mother is alive. He indicated that his father is alive.   Social History    Social History   Socioeconomic History   Marital status: Married    Spouse name: Not on file    Number of children: Not on file   Years of education: Not on file   Highest education level: Not on file  Occupational History   Not on file  Tobacco Use   Smoking status: Former    Packs/day: 0.50    Pack years: 0.00    Types: Cigarettes   Smokeless tobacco: Never  Vaping Use   Vaping Use: Every day   Devices: 1 cartridge q 2 days  Substance and Sexual Activity   Alcohol use: No   Drug use: Yes    Types: Marijuana    Comment: occassionally   Sexual activity: Yes    Partners: Female  Other Topics Concern   Not on file  Social History Narrative   Not on file   Social Determinants of Health   Financial Resource Strain: High Risk   Difficulty of Paying Living Expenses: Hard  Food Insecurity: Food Insecurity Present   Worried About Running Out of Food in the Last Year: Sometimes true   Ran Out of Food in the Last Year: Sometimes true  Transportation Needs: No Transportation Needs   Lack of Transportation (Medical): No   Lack of Transportation (Non-Medical): No  Physical Activity: Not on file  Stress: Not on file  Social Connections: Not on file  Intimate Partner Violence: Not on file     Review of Systems    General:  No chills, fever, night sweats or weight changes.  Cardiovascular:  +++ chest pain, +++ dyspnea, no edema, orthopnea, palpitations, paroxysmal nocturnal dyspnea. Dermatological: No rash, lesions/masses Respiratory: No cough, dyspnea Urologic: No hematuria, dysuria Abdominal:   No nausea, vomiting, diarrhea, bright red blood per rectum, melena, or hematemesis Neurologic:  No visual changes, wkns, changes in mental status. All other systems reviewed and are otherwise negative except as noted above.  Physical Exam    Blood pressure 118/78, pulse (!) 52, temperature 97.8 F (36.6 C), temperature source Oral, resp. rate 19, height 5\' 11"  (1.803 m), weight 112.8 kg, SpO2 99 %.  General: Pleasant, NAD Psych: Flat affect. Neuro: Alert and oriented X 3.  Moves all extremities spontaneously. HEENT: Normal  Neck: Supple without bruits or JVD. Lungs:  Resp regular and unlabored, CTA. Heart: RRR no s3, s4, or murmurs. Abdomen: Soft, non-tender, non-distended, BS + x 4.  Extremities: No clubbing, cyanosis or edema. DP/PT2+, Radials 2+ and equal bilaterally.  Labs    Cardiac Enzymes Recent Labs  Lab 08/19/20 2343 08/20/20 0135 08/20/20 0645  TROPONINIHS 115* 203* 2,087*      Lab Results  Component Value Date   WBC 5.6 08/20/2020   HGB 12.7 (L) 08/20/2020   HCT 38.4 (L) 08/20/2020   MCV 85.0 08/20/2020   PLT 222 08/20/2020    Recent Labs  Lab 08/20/20 0645  NA 138  K 3.6  CL 104  CO2 27  BUN 10  CREATININE 1.11  CALCIUM 8.5*  PROT 6.6  BILITOT 0.7  ALKPHOS 39  ALT 71*  AST 110*  GLUCOSE 100*   Lab Results  Component Value Date   CHOL 167 08/20/2020   HDL 40 (L) 08/20/2020   LDLCALC 118 (H) 08/20/2020   TRIG 46 08/20/2020     Radiology Studies    DG Chest 2 View  Result Date: 08/20/2020 CLINICAL DATA:  Chest pain EXAM: CHEST - 2 VIEW COMPARISON:  06/24/2020 FINDINGS: The heart size and mediastinal contours are within normal limits. Both lungs are clear. The visualized skeletal structures are unremarkable. IMPRESSION: Negative. Electronically Signed   By: Charlett NoseKevin  Dover M.D.   On: 08/20/2020 00:11   CT Angio Chest PE W/Cm &/Or Wo Cm  Result Date: 08/20/2020 CLINICAL DATA:  Chest pain EXAM: CT ANGIOGRAPHY CHEST WITH CONTRAST TECHNIQUE: Multidetector CT imaging of the chest was performed using the standard protocol during bolus administration of intravenous contrast. Multiplanar CT image reconstructions and MIPs were obtained to evaluate the vascular anatomy. CONTRAST:  100mL OMNIPAQUE IOHEXOL 350 MG/ML SOLN COMPARISON:  None. FINDINGS: Cardiovascular: Satisfactory opacification of the pulmonary arteries to the segmental level. No evidence of pulmonary embolism. Normal heart size. No pericardial effusion.  Mediastinum/Nodes: No enlarged mediastinal, hilar, or axillary lymph nodes. Thyroid gland, trachea, and esophagus demonstrate no significant findings. Residual thymic tissue within the anterior mediastinum. Lungs/Pleura: Lungs are clear. No pleural effusion or pneumothorax. Upper Abdomen: No acute abnormality. Musculoskeletal: No acute bone abnormality. Mild degenerative changes are noted within the midthoracic spine with a limbus vertebra noted at T11, a benign finding. Review of the MIP images confirms the above findings. IMPRESSION: No pulmonary embolism. No acute intrathoracic pathology identified. No definite radiographic explanation for the patient's symptoms. Electronically Signed   By: Helyn NumbersAshesh  Parikh MD   On: 08/20/2020 02:50   US Abdomen Limited RUQ (LIVER/GB)  Result Date: 08/20/2020 CLINICAL DATA:  35 year old male with chest and right upper quadrant pain for several hours. EXAM: ULTRASOUND ABDOMEN LIMITED RIGHT UPPER QUADRANT COMPARISON:  Ultrasound 04/09/2020. CT Abdomen and Pelvis 08/29/2019. FINDINGS: Gallbladder: Contracted, similar appearance to the prior exams. No echogenic sludge or stones identified. Gallbladder wall thickness within normal limits at 2-3 mm. No pericholecystic fluid. No sonographic Murphy sign elicited. Common bile duct: Diameter: 3-4 mm, normal. Liver: Liver echogenicity is at the upper limits of normal (image 38). No discrete liver lesion. No intrahepatic biliary ductal dilatation. Portal vein is patent on color Doppler imaging with normal direction of blood flow towards the liver. Other: Negative visible right kidney. IMPRESSION: Essentially negative right upper quadrant ultrasound. No explanation for pain. Electronically Signed   By: Odessa FlemingH  Hall M.D.   On: 08/20/2020 04:52    ECG & Cardiac Imaging    RSR, 62, 1mm ST elevation in I and aVL; inferior and anterolateral TWI - no acute changes - personally reviewed.  Assessment & Plan    1.  Non-STEMI: Patient with a prior  history of sharp chest pain that improved with activity, for which she was evaluated by Dr. Okey DupreEnd in early June.  He was noted to have an abnormal EEG with early repolarization and inferolateral T wave inversion.  Echocardiogram at that time showed normal LV function without significant valvular disease.  Plan was for coronary CTA which is currently scheduled for July 7.  He did well over the subsequent month but then yesterday evening developed chest heaviness and pressure associated mild  dyspnea and lightheadedness while at home.  When symptoms did not immediately abate, he presented to the emergency department where ECG showed no acute changes.  Troponin rose from [phone] this morning.  Coronary CTA negative for PE.  He is currently chest pain-free.  Continue aspirin, heparin, beta-blocker, and statin therapy.  We discussed options for management with recommendation for diagnostic catheterization today.  The patient understands that risks include but are not limited to stroke (1 in 1000), death (1 in 1000), kidney failure [usually temporary] (1 in 500), bleeding (1 in 200), allergic reaction [possibly serious] (1 in 200), and agrees to proceed.  Avoidance of testosterone supplements recommended.  2.  Untreated hypertension: Blood pressure stable on low-dose beta-blocker therapy this morning.  3.  Hyperlipidemia with LDL goal less than 70: LDL 118.  Continue high potency statin therapy.  4.  Nicotine abuse: Previously smoked cigarettes but now vapes.  Complete cessation advised.  5.  Marijuana abuse: Smoked marijuana on June 29.  He does not think it was laced with anything.  Cessation advised.  6.  History of abnormal LFTs: Per internal medicine.  Signed, Nicolasa Ducking, NP 08/20/2020, 8:00 AM  For questions or updates, please contact   Please consult [email or web address] for contact info under Cardiology/STEMI.

## 2020-08-20 NOTE — Progress Notes (Signed)
ANTICOAGULATION CONSULT NOTE  Pharmacy Consult for heparin infusion Indication: ACS/STEMI  Allergies  Allergen Reactions   Antihistamines, Diphenhydramine-Type     liver   Tylenol [Acetaminophen]     Liver problems    Patient Measurements: Height: 5\' 11"  (180.3 cm) Weight: 112.8 kg (248 lb 9.6 oz) IBW/kg (Calculated) : 75.3 Heparin Dosing Weight: 98.5 kg  Vital Signs: Temp: 98 F (36.7 C) (07/01 1301) Temp Source: Oral (07/01 1301) BP: 119/72 (07/01 1301) Pulse Rate: 56 (07/01 1301)  Labs: Recent Labs    08/19/20 2343 08/20/20 0130 08/20/20 0135 08/20/20 0645 08/20/20 1246  HGB 13.2  --   --  12.7*  --   HCT 40.1  --   --  38.4*  --   PLT 221  --   --  222  --   APTT  --  24  --   --   --   LABPROT  --  13.3  --   --   --   INR  --  1.0  --   --   --   HEPARINUNFRC  --   --   --  0.44 0.58  CREATININE 1.15  --   --  1.11  --   TROPONINIHS 115*  --  203* 2,087*  --      Estimated Creatinine Clearance: 118.6 mL/min (by C-G formula based on SCr of 1.11 mg/dL).   Medical History: Past Medical History:  Diagnosis Date   Chest pain    a. 07/2020 Echo: EF 60-65%, no rwma, nl RV fxn. No significant valvular dzs.   Elevated liver function tests unk   Essential hypertension    Rhabdomyolysis    Tobacco abuse    Vapes nicotine containing substance     Assessment: Pt is a 35 yo male w/ h/o HTN (diet-managed) c/o chest pain/tightness.  Troponin I found elevated following arrival to ED.  Date Time aPTT/HL Rate/Comment 0701 0645 0.44  Thera x1; 1350 un/hr     0701  1246 0.58  Thera x2; 1350 un/hr  Labs: BL aPTT - 24s BL INR - 1.0 Hgb - 13.2>12.7 Plts - 221>222 Trop: 12-23-2000   Goal of Therapy:  Heparin level 0.3-0.7 units/ml Monitor platelets by anticoagulation protocol: Yes   Plan:  Heparin level consecutively therapeutic x2 @0 .58 (0701 0645). Can switch to daily monitoring at current rate if continued post-cath. Pt currently of the floor for  diagnostic cath and will need f/u if continued.  IF continued per cardiology, will resume heparin infusion at current rate 1350 un/hr. Check daily anti-Xa level and monitor H&H with platelets daily with AM labs  [id number], [facility] 08/20/2020 1:32 PM

## 2020-08-20 NOTE — Plan of Care (Signed)

## 2020-08-20 NOTE — ED Notes (Signed)
Pt offered pain medication, but declined at this time.

## 2020-08-20 NOTE — ED Provider Notes (Signed)
[facility] Emergency Department Provider Note   ____________________________________________   Event Date/Time   First MD Initiated Contact with Patient 08/20/20 [phone]     (approximate)  I have reviewed the triage vital signs and the nursing notes.   HISTORY  Chief Complaint Chest Pain    HPI Benjamin Beltran is a 35 y.o. male who presents to the ED from home with a chief complaint of chest pain.  Approximately 10 PM patient was at rest when he felt central chest tightness associated with mild shortness of breath.  Denies associated radiation, diaphoresis, palpitations, nausea/vomiting or dizziness.  Seen in the ED 3 weeks ago for same; followed up with Dr. Okey Dupre from cardiology for abnormal EKG and mildly elevated troponins in the 40 range.  He was scheduled for a cardiac CT and follow-up appointment in 1 month.  Denies fever, cough, abdominal pain, diarrhea.  Denies recent travel, trauma or hormone use.  Does take workout supplements.  Denies illicit drug use.     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 Authorizing Provider  aspirin EC 81 MG tablet Take 1 tablet (81 mg total) by mouth daily. Swallow whole. 07/21/20  Yes End, Cristal Deer, MD  Multiple Vitamin (MULTIVITAMIN) tablet Take 1 tablet by mouth daily.   Yes [provider]  metoprolol tartrate (LOPRESSOR) 25 MG tablet Take 1 tablet (25 mg total) by mouth once for 1 dose. Prior to  cardiac CTA 07/26/20 07/26/20  End, Cristal Deer, MD    Allergies Antihistamines, diphenhydramine-type and Tylenol [acetaminophen]  Family History  Problem Relation Age of Onset   Diabetes Mother    Irregular heart beat Mother     Social History Social History   Tobacco Use   Smoking status: Former    Packs/day: 0.50    Pack years: 0.00    Types: Cigarettes   Smokeless tobacco: Never  Vaping Use   Vaping Use: Every day   Devices: 1 cartridge q 2 days  Substance Use Topics   Alcohol use: No   Drug use: Not Currently    Types: Marijuana    Comment: occassionally    Review of Systems  Constitutional: No fever/chills Eyes: No visual changes. ENT: No sore throat. Cardiovascular: Positive for chest pain. Respiratory: Denies shortness of breath. Gastrointestinal: No abdominal pain.  No nausea, no vomiting.  No diarrhea.  No constipation. Genitourinary: Negative for dysuria. Musculoskeletal: Negative for back pain. Skin: Negative for rash. Neurological: Negative for headaches, focal weakness or numbness.   ____________________________________________   PHYSICAL EXAM:  VITAL SIGNS: ED Triage Vitals  Enc Vitals Group     BP 08/19/20 2349 (!) 146/92     Pulse Rate 08/19/20 2349 63     Resp 08/19/20 2349 20     Temp 08/19/20 2349 97.7 F (36.5 C)     Temp Source 08/19/20 2349 Oral     SpO2 08/19/20  2349 98 %     Weight 08/19/20 2341 240 lb (108.9 kg)     Height 08/19/20 2341 5\' 11"  (1.803 m)     Head Circumference --      Peak Flow --      Pain Score 08/19/20 2340 10     Pain Loc --      Pain Edu? --      Excl. in GC? --     Constitutional: Alert and oriented. Well appearing and in no acute distress. Eyes: Conjunctivae are normal. PERRL. EOMI. Head: Atraumatic. Nose: No congestion/rhinnorhea. Mouth/Throat: Mucous membranes are moist.   Neck: No stridor.   Cardiovascular: Normal rate, regular rhythm. Grossly normal heart sounds.  Good peripheral  circulation. Respiratory: Normal respiratory effort.  No retractions. Lungs CTAB. Gastrointestinal: Soft and nontender. No distention. No abdominal bruits. No CVA tenderness. Musculoskeletal: No lower extremity tenderness nor edema.  No joint effusions. Neurologic:  Normal speech and language. No gross focal neurologic deficits are appreciated. No gait instability. Skin:  Skin is warm, dry and intact. No rash noted. Psychiatric: Mood and affect are normal. Speech and behavior are normal.  ____________________________________________   LABS (all labs ordered are listed, but only abnormal results are displayed)  Labs Reviewed  COMPREHENSIVE METABOLIC PANEL - Abnormal; Notable for the following components:      Result Value   Potassium 3.2 (*)    Glucose, Bld 137 (*)    AST 107 (*)    ALT 77 (*)    All other components within normal limits  TROPONIN I (HIGH SENSITIVITY) - Abnormal; Notable for the following components:   Troponin I (High Sensitivity) 115 (*)    All other components within normal limits  TROPONIN I (HIGH SENSITIVITY) - Abnormal; Notable for the following components:   Troponin I (High Sensitivity) 203 (*)    All other components within normal limits  RESP PANEL BY RT-PCR (FLU A&B, COVID) ARPGX2  CBC WITH DIFFERENTIAL/PLATELET  PROTIME-INR  APTT  MAGNESIUM  BASIC METABOLIC PANEL  LIPID PANEL  CBC  HIV ANTIBODY (ROUTINE TESTING W REFLEX)  HEPARIN LEVEL (UNFRACTIONATED)  HEPATIC FUNCTION PANEL   ____________________________________________  EKG  ED ECG REPORT I, Charmaine Placido J, the attending physician, personally viewed and interpreted this ECG.   Date: 08/20/2020  EKG Time: 2341  Rate: 62  Rhythm: normal sinus rhythm  Axis: Normal  Intervals:none  ST&T Change: Inverted T waves inferior laterally  ____________________________________________  RADIOLOGY I, Alvan Culpepper J, personally viewed and evaluated these images (plain radiographs) as part of my medical  decision making, as well as reviewing the written report by the radiologist.  ED MD interpretation: No acute cardiopulmonary process; no PE  Official radiology report(s): DG Chest 2 View  Result Date: 08/20/2020 CLINICAL DATA:  Chest pain EXAM: CHEST - 2 VIEW COMPARISON:  06/24/2020 FINDINGS: The heart size and mediastinal contours are within normal limits. Both lungs are clear. The visualized skeletal structures are unremarkable. IMPRESSION: Negative. Electronically Signed   By: 08/24/2020 M.D.   On: 08/20/2020 00:11   CT Angio Chest PE W/Cm &/Or Wo Cm  Result Date: 08/20/2020 CLINICAL DATA:  Chest pain EXAM: CT ANGIOGRAPHY CHEST WITH CONTRAST TECHNIQUE: Multidetector CT imaging of the chest was performed using the standard protocol during bolus administration of intravenous contrast. Multiplanar CT image reconstructions and MIPs were obtained to evaluate the vascular anatomy. CONTRAST:  10/21/2020 OMNIPAQUE IOHEXOL 350 MG/ML SOLN COMPARISON:  None. FINDINGS: Cardiovascular: Satisfactory opacification of the pulmonary arteries to the  segmental level. No evidence of pulmonary embolism. Normal heart size. No pericardial effusion. Mediastinum/Nodes: No enlarged mediastinal, hilar, or axillary lymph nodes. Thyroid gland, trachea, and esophagus demonstrate no significant findings. Residual thymic tissue within the anterior mediastinum. Lungs/Pleura: Lungs are clear. No pleural effusion or pneumothorax. Upper Abdomen: No acute abnormality. Musculoskeletal: No acute bone abnormality. Mild degenerative changes are noted within the midthoracic spine with a limbus vertebra noted at T11, a benign finding. Review of the MIP images confirms the above findings. IMPRESSION: No pulmonary embolism. No acute intrathoracic pathology identified. No definite radiographic explanation for the patient's symptoms. Electronically Signed   By: Helyn Numbers MD   On: 08/20/2020 02:50     ____________________________________________   PROCEDURES  Procedure(s) performed (including Critical Care):  .1-3 Lead EKG Interpretation  Date/Time: 08/20/2020 1:01 AM Performed by: Irean Hong, MD Authorized by: Irean Hong, MD     Interpretation: normal     ECG rate:  62   ECG rate assessment: normal     Rhythm: sinus rhythm     Ectopy: none     Conduction: normal   Comments:     Patient placed on cardiac monitor to evaluate for arrhythmias   CRITICAL CARE Performed by: Irean Hong   Total critical care time: 30 minutes  Critical care time was exclusive of separately billable procedures and treating other patients.  Critical care was necessary to treat or prevent imminent or life-threatening deterioration.  Critical care was time spent personally by me on the following activities: development of treatment plan with patient and/or surrogate as well as nursing, discussions with consultants, evaluation of patient's response to treatment, examination of patient, obtaining history from patient or surrogate, ordering and performing treatments and interventions, ordering and review of laboratory studies, ordering and review of radiographic studies, pulse oximetry and re-evaluation of patient's condition.  ____________________________________________   INITIAL IMPRESSION / ASSESSMENT AND PLAN / ED COURSE  As part of my medical decision making, I reviewed the following data within the electronic MEDICAL RECORD NUMBER History obtained from family, Nursing notes reviewed and incorporated, Labs reviewed, EKG interpreted, Old EKG reviewed, Old chart reviewed, Radiograph reviewed, Discussed with admitting physician, and Notes from prior ED visits     35 year old male presenting with chest pain. Differential diagnosis includes, but is not limited to, ACS, aortic dissection, pulmonary embolism, cardiac tamponade, pneumothorax, pneumonia, pericarditis, myocarditis, GI-related causes  including esophagitis/gastritis, and musculoskeletal chest wall pain.     Laboratory is remarkable for mild hypokalemia and elevated troponin which is higher than it was previously.  EKG stable from prior with inverted T waves in inferior and lateral leads.  Will administer aspirin, nitroglycerin paste, initiate heparin bolus with drip.  Will discuss with hospitalist services for admission.  Clinical Course as of 08/20/20 0330  Fri Aug 20, 2020  0144 Will order CTA chest per hospitalist recommendation. [JS]  0330 CTA demonstrates no PE. [JS]    Clinical Course User Index [JS] Irean Hong, MD     ____________________________________________   FINAL CLINICAL IMPRESSION(S) / ED DIAGNOSES  Final diagnoses:  NSTEMI (non-ST elevated myocardial infarction) (HCC)  Unstable angina (HCC)  Hypertension, unspecified type     ED Discharge Orders     None        Note:  This document was prepared using Dragon voice recognition software and may include unintentional dictation errors.    Irean Hong, MD 08/20/20 [phone]

## 2020-08-20 NOTE — Progress Notes (Signed)
ANTICOAGULATION CONSULT NOTE  Pharmacy Consult for heparin infusion Indication: ACS/STEMI  Allergies  Allergen Reactions   Antihistamines, Diphenhydramine-Type     liver   Tylenol [Acetaminophen]     Liver problems    Patient Measurements: Height: 5\' 11"  (180.3 cm) Weight: 112.8 kg (248 lb 9.6 oz) IBW/kg (Calculated) : 75.3 Heparin Dosing Weight: 98.5 kg  Vital Signs: Temp: 97.8 F (36.6 C) (07/01 0747) Temp Source: Oral (07/01 0747) BP: 118/78 (07/01 0747) Pulse Rate: 52 (07/01 0747)  Labs: Recent Labs    08/19/20 2343 08/20/20 0130 08/20/20 0135 08/20/20 0645  HGB 13.2  --   --  12.7*  HCT 40.1  --   --  38.4*  PLT 221  --   --  222  APTT  --  24  --   --   LABPROT  --  13.3  --   --   INR  --  1.0  --   --   HEPARINUNFRC  --   --   --  0.44  CREATININE 1.15  --   --  1.11  TROPONINIHS 115*  --  203* 2,087*     Estimated Creatinine Clearance: 118.6 mL/min (by C-G formula based on SCr of 1.11 mg/dL).   Medical History: Past Medical History:  Diagnosis Date   Chest pain    a. 07/2020 Echo: EF 60-65%, no rwma, nl RV fxn. No significant valvular dzs.   Elevated liver function tests unk   Essential hypertension    Rhabdomyolysis    Tobacco abuse    Vapes nicotine containing substance     Assessment: Pt is a 35 yo male w/ h/o HTN (diet-managed) c/o chest pain/tightness.  Troponin I found elevated following arrival to ED.  Date Time aPTT/HL Rate/Comment 0701 0645 0.44  Thera x1; 1350 un/hr      Labs: BL aPTT - 24s BL INR - 1.0 Hgb - 13.2>12.7 Plts - 221>222 Trop: 12-23-2000   Goal of Therapy:  Heparin level 0.3-0.7 units/ml Monitor platelets by anticoagulation protocol: Yes   Plan:  Heparin level therapeutic @0 .44 (0701 0645). Continue heparin infusion at current rate 1350 un/hr. Re-check anti-Xa level in 6 hours to confirm and, if therapeutic, then daily while on heparin Continue to monitor H&H and platelets daily with AM labs  [id number], [facility] 08/20/2020 8:10 AM

## 2020-08-20 NOTE — H&P (Addendum)
[place]   PATIENT NAME: Benjamin Beltran    MR#:  [id number]  DATE OF BIRTH:  May 03, 1985  DATE OF ADMISSION:  08/20/2020  PRIMARY CARE PHYSICIAN: Patient, No Pcp Per (Inactive)   Patient is coming from: Home  REQUESTING/REFERRING PHYSICIAN: Chiquita Loth, MD  CHIEF COMPLAINT:   Chief Complaint  Patient presents with   Chest Pain    HISTORY OF PRESENT ILLNESS:  Benjamin Beltran is a 35 y.o. African-American male male with medical history significant for diet managed hypertension, who presented to the emergency room with acute onset of central chest tightness radiating to both sides of the chest as well as to his neck that started around 10 PM and graded 10/10 in severity with associated dyspnea without nausea or vomiting or diaphoresis.  He denies any neck pain or edema or recent travels or surgeries.  He had similar chest pain about a month ago and was referred to Dr. Okey Dupre.  He had an echo in the office that showed no abnormalities.  He has a history of tobacco abuse smoking 1 pack of cigarettes per day since he was 35 year old and quitting the breath 5 years ago.  He currently vapes on a regular basis.  He was expected to have a coronary CTA and take baby aspirin daily and was advised to quit vaping but he did not yet and was expected to follow-up with Dr. Okey Dupre.  He denied any abdominal pain or melena or bright red bleeding per rectum.  No bleeding diathesis.  No dysuria, oliguria or hematuria or flank pain.  ED Course: When he came to the ER blood pressure was 146/92 with otherwise normal vital signs.  Labs revealed hypokalemia of 3.2 and AST of 107 with ALT of 77 above levels in February of this year.  CBC was within normal.  Influenza antigens and COVID-19 PCR came back negative.  Troponin I was 115.  EKG as reviewed by me :  showed normal sinus rhythm with a rate of 62 with 2 inversion anterolaterally and inferiorly. Imaging: Two-view chest x-ray showed no acute cardiopulmonary  disease.  The patient was given 4 baby aspirin, 1 inch of Nitropaste, heparin bolus and infusion.  He will be admitted to an observation progressive unit bed for further evaluation and management. PAST MEDICAL HISTORY:   Past Medical History:  Diagnosis Date   Elevated blood pressure reading    Elevated liver function tests unk  Diet managed hypertension PAST SURGICAL HISTORY:   Past Surgical History:  Procedure Laterality Date   NECK SURGERY     tendon surgery to left hand  unk    SOCIAL HISTORY:   Social History   Tobacco Use   Smoking status: Former    Packs/day: 0.50    Pack years: 0.00    Types: Cigarettes   Smokeless tobacco: Never  Substance Use Topics   Alcohol use: No    FAMILY HISTORY:   Family History  Problem Relation Age of Onset   Diabetes Mother    Irregular heart beat Mother     DRUG ALLERGIES:   Allergies  Allergen Reactions   Antihistamines, Diphenhydramine-Type     liver   Tylenol [Acetaminophen]     Liver problems    REVIEW OF SYSTEMS:   ROS As per history of present illness. All pertinent systems were reviewed above. Constitutional, HEENT, cardiovascular, respiratory, GI, GU, musculoskeletal, neuro, psychiatric, endocrine, integumentary and hematologic systems were reviewed and are otherwise negative/unremarkable except for  positive findings mentioned above in the HPI.   MEDICATIONS AT HOME:   Prior to Admission medications   Medication Sig Start Date End Date Taking? Authorizing Provider  aspirin EC 81 MG tablet Take 1 tablet (81 mg total) by mouth daily. Swallow whole. 07/21/20  Yes End, Cristal Deer, MD  Multiple Vitamin (MULTIVITAMIN) tablet Take 1 tablet by mouth daily.   Yes [provider]  metoprolol tartrate (LOPRESSOR) 25 MG tablet Take 1 tablet (25 mg total) by mouth once for 1 dose. Prior to cardiac CTA 07/26/20 07/26/20  End, Cristal Deer, MD      VITAL SIGNS:  Blood pressure 127/82, pulse 64, temperature 97.7 F  (36.5 C), temperature source Oral, resp. rate 14, height 5\' 11"  (1.803 m), weight 108.9 kg, SpO2 100 %.  PHYSICAL EXAMINATION:  Physical Exam  GENERAL:  35 y.o.-year-old African-American male patient lying in the bed with no acute distress.  EYES: Pupils equal, round, reactive to light and accommodation. No scleral icterus. Extraocular muscles intact.  HEENT: Head atraumatic, normocephalic. Oropharynx and nasopharynx clear.  NECK:  Supple, no jugular venous distention. No thyroid enlargement, no tenderness.  LUNGS: Normal breath sounds bilaterally, no wheezing, rales,rhonchi or crepitation. No use of accessory muscles of respiration.  CARDIOVASCULAR: Regular rate and rhythm, S1, S2 normal. No murmurs, rubs, or gallops.  ABDOMEN: Soft, nondistended, nontender. Bowel sounds present. No organomegaly or mass.  EXTREMITIES: No pedal edema, cyanosis, or clubbing.  NEUROLOGIC: Cranial nerves II through XII are intact. Muscle strength 5/5 in all extremities. Sensation intact. Gait not checked.  PSYCHIATRIC: The patient is alert and oriented x 3.  Normal affect and good eye contact. SKIN: No obvious rash, lesion, or ulcer.   LABORATORY PANEL:   CBC Recent Labs  Lab 08/19/20 2343  WBC 7.7  HGB 13.2  HCT 40.1  PLT 221   ------------------------------------------------------------------------------------------------------------------  Chemistries  Recent Labs  Lab 08/19/20 2343  NA 136  K 3.2*  CL 103  CO2 26  GLUCOSE 137*  BUN 13  CREATININE 1.15  CALCIUM 8.9  AST 107*  ALT 77*  ALKPHOS 45  BILITOT 0.6   ------------------------------------------------------------------------------------------------------------------  Cardiac Enzymes No results for input(s): TROPONINI in the last 168 hours. ------------------------------------------------------------------------------------------------------------------  RADIOLOGY:  DG Chest 2 View  Result Date: 08/20/2020 CLINICAL DATA:   Chest pain EXAM: CHEST - 2 VIEW COMPARISON:  06/24/2020 FINDINGS: The heart size and mediastinal contours are within normal limits. Both lungs are clear. The visualized skeletal structures are unremarkable. IMPRESSION: Negative. Electronically Signed   By: 08/24/2020 M.D.   On: 08/20/2020 00:11      IMPRESSION AND PLAN:  Active Problems:   Chest pain  1.  Chest pain with elevated troponin-I , concerning for acute coronary syndrome, rule out non-ST elevation MI. - The patient will be admitted to AN observation progressive unit bed. - We will continue him on IV heparin.  He will be placed on penicillin nitroglycerin and morphine sulfate for pain. - We will add beta-blocker therapy with p.o. Lopressor. - Statin therapy is held off given elevated LFTs.  We will check fasting lipids. - The patient had a recent normal 2D echo last month. - Cardiology consult will be obtained. - We will notify Dr. 10/21/2020 about the patient.  2.  Hypokalemia. - Replace potassium and check magnesium level.  3.  Elevated LFTs. - We will obtain right upper quadrant ultrasound and follow LFTs with hydration.  4.  Essential hypertension. - She will be placed on p.o.  Lopressor as mentioned above.  5.  History of tobacco abuse with current nicotine vaping. - I counseled him for cessation and he will receive further counseling here.  DVT prophylaxis: IV heparin. Code Status: full code. Family Communication:  The plan of care was discussed in details with the patient (and his wife who was with him in the room). I answered all questions. The patient agreed to proceed with the above mentioned plan. Further management will depend upon hospital course. Disposition Plan: Back to previous home environment Consults called: Cardiology.   All the records are reviewed and case discussed with ED provider.  Status is: Observation  The patient remains OBS appropriate and will d/c before 2 midnights.  Dispo: The patient is  from: Home              Anticipated d/c is to: Home              Patient currently is not medically stable to d/c.   Difficult to place patient No   TOTAL TIME TAKING CARE OF THIS PATIENT: 55 minutes.    Hannah Beat M.D on 08/20/2020 at 1:46 AM  [facility]   From 7 PM-7 AM, contact night-coverage [email or web address]  CC: Primary care physician; Patient, No Pcp Per (Inactive)

## 2020-08-20 NOTE — Progress Notes (Signed)
ANTICOAGULATION CONSULT NOTE  Pharmacy Consult for heparin infusion Indication: ACS/STEMI  Allergies  Allergen Reactions   Antihistamines, Diphenhydramine-Type     liver   Tylenol [Acetaminophen]     Liver problems    Patient Measurements: Height: 5\' 11"  (180.3 cm) Weight: 108.9 kg (240 lb) IBW/kg (Calculated) : 75.3 Heparin Dosing Weight: 98.5 kg  Vital Signs: Temp: 97.7 F (36.5 C) (06/30 2349) Temp Source: Oral (06/30 2349) BP: 127/82 (07/01 0103) Pulse Rate: 64 (07/01 0103)  Labs: Recent Labs    08/19/20 2343  HGB 13.2  HCT 40.1  PLT 221  CREATININE 1.15  TROPONINIHS 115*    Estimated Creatinine Clearance: 112.5 mL/min (by C-G formula based on SCr of 1.15 mg/dL).   Medical History: Past Medical History:  Diagnosis Date   Elevated blood pressure reading    Elevated liver function tests unk    Assessment: Pt is a 35 yo male c/o chest pain/tightness.  Troponin I found elevated following arrival to ED.  Goal of Therapy:  Heparin level 0.3-0.7 units/ml Monitor platelets by anticoagulation protocol: Yes   Plan:  Ordered baseline labs. Give 4000 units bolus x 1 Start heparin infusion at 1350 units/hr Check anti-Xa level in 6 hours and daily while on heparin Continue to monitor H&H and platelets  31, PharmD, [facility] 08/20/2020 1:09 AM

## 2020-08-21 DIAGNOSIS — R7989 Other specified abnormal findings of blood chemistry: Secondary | ICD-10-CM

## 2020-08-21 DIAGNOSIS — Z716 Tobacco abuse counseling: Secondary | ICD-10-CM

## 2020-08-21 LAB — CBC
HCT: 38.5 % — ABNORMAL LOW (ref 39.0–52.0)
Hemoglobin: 12.8 g/dL — ABNORMAL LOW (ref 13.0–17.0)
MCH: 28.5 pg (ref 26.0–34.0)
MCHC: 33.2 g/dL (ref 30.0–36.0)
MCV: 85.7 fL (ref 80.0–100.0)
Platelets: 198 10*3/uL (ref 150–400)
RBC: 4.49 MIL/uL (ref 4.22–5.81)
RDW: 13.3 % (ref 11.5–15.5)
WBC: 4.4 10*3/uL (ref 4.0–10.5)
nRBC: 0 % (ref 0.0–0.2)

## 2020-08-21 LAB — HEPATIC FUNCTION PANEL
ALT: 67 U/L — ABNORMAL HIGH (ref 0–44)
AST: 89 U/L — ABNORMAL HIGH (ref 15–41)
Albumin: 3.4 g/dL — ABNORMAL LOW (ref 3.5–5.0)
Alkaline Phosphatase: 37 U/L — ABNORMAL LOW (ref 38–126)
Bilirubin, Direct: <0.1 mg/dL (ref 0.0–0.2)
Total Bilirubin: 0.5 mg/dL (ref 0.3–1.2)
Total Protein: 6.2 g/dL — ABNORMAL LOW (ref 6.5–8.1)

## 2020-08-21 LAB — TROPONIN I (HIGH SENSITIVITY): Troponin I (High Sensitivity): 1900 ng/L (ref <18)

## 2020-08-21 MED ORDER — AMLODIPINE BESYLATE 5 MG PO TABS: 5.0000 mg | ORAL_TABLET | Freq: Every day | ORAL | 3 refills | Status: DC

## 2020-08-21 MED ORDER — CLOPIDOGREL BISULFATE 75 MG PO TABS: 75.0000 mg | ORAL_TABLET | Freq: Every day | ORAL | 3 refills | Status: DC

## 2020-08-21 MED ORDER — NITROGLYCERIN 0.4 MG SL SUBL: 0.4000 mg | SUBLINGUAL_TABLET | SUBLINGUAL | 12 refills | Status: DC | PRN

## 2020-08-21 MED ORDER — ATORVASTATIN CALCIUM 40 MG PO TABS: 40.0000 mg | ORAL_TABLET | Freq: Every day | ORAL | 3 refills | Status: DC

## 2020-08-21 NOTE — Discharge Instructions (Signed)
Patient advised to stop drinking alcohol, using marijuana and vaping

## 2020-08-21 NOTE — Discharge Summary (Signed)
[facility] [place] [facility]   PATIENT NAME: Benjamin Beltran    MR#:  [id number]  DATE OF BIRTH:  Jun 16, 1985  DATE OF ADMISSION:  08/20/2020 ADMITTING PHYSICIAN: Enedina Finner, MD  DATE OF DISCHARGE: 08/21/2020  PRIMARY CARE PHYSICIAN: Patient, No Pcp Per (Inactive)    ADMISSION DIAGNOSIS:  Unstable angina (HCC) [I20.0] Pain [R52] NSTEMI (non-ST elevated myocardial infarction) (HCC) [I21.4] Chest pain [R07.9] Hypertension, unspecified type [I10]  DISCHARGE DIAGNOSIS:  Acute NSTEMI s/p cath  SECONDARY DIAGNOSIS:   Past Medical History:  Diagnosis Date   Chest pain    a. 07/2020 Echo: EF 60-65%, no rwma, nl RV fxn. No significant valvular dzs.   Elevated liver function tests unk   Essential hypertension    Rhabdomyolysis    Tobacco abuse    Vapes nicotine containing substance     HOSPITAL COURSE:  Benjamin Beltran is a 35 y.o. African-American male male with medical history significant for diet managed hypertension, who presented to the emergency room with acute onset of central chest tightness radiating to both sides of the chest as well as to his neck that started around 10 PM and graded 10/10 in severity with associated dyspnea without nausea or vomiting or diaphoresis  1.  Chest pain with elevated troponin-I , concerning for acute coronary syndrome, Acute non-ST elevation MI. - Troponin was 203--- 2087-- 1900 -- patient was placed on aspirin, Nitro, beta-blockers and statins -- seen by [facility] cardiology Dr. Kirke Corin -- cardiac Catheterization showed RPDA lesion is 100% stenosed. The left ventricular systolic function is normal. LV end diastolic pressure is mildly elevated. The left ventricular ejection fraction is 55-65% by visual estimate.   1.  No significant coronary artery disease noted in the major arteries.  The right PDA seems to be occluded distally.  This is the likely culprit for non-STEMI. 2.  Normal LV systolic function mildly elevated left  ventricular end-diastolic pressure.   Recommendations: The patient's coronary arteries are overall normal except the distal portion of the right PDA.  Suspect localized plaque rupture or vasospasm leading to occlusion.  Recommend medical therapy.  place the patient on clopidogrel for a year. Continue treatment with a statin. I switched metoprolol to amlodipine. I recommended that he quit tobacco use.  Patient hemodynamically stable. Will follow-up with cardiology as outpatient  2.  Hypokalemia. - Replaced  potassium   3.  Elevated LFTs. Suspected due to alcohol use. Patient advised physician. LFTs trending down.   4.  Essential hypertension. - Continue amlodipine  5.  History of tobacco abuse with current nicotine vaping. - counseled him for cessation   pt hemodynamically stable. Will discharged with outpatient follow-up cardiology.  CONSULTS OBTAINED:  Treatment Team:  Iran Ouch, MD  DRUG ALLERGIES:   Allergies  Allergen Reactions   Antihistamines, Diphenhydramine-Type     liver   Tylenol [Acetaminophen]     Liver problems    DISCHARGE MEDICATIONS:   Allergies as of 08/21/2020       Reactions   Antihistamines, Diphenhydramine-type    liver   Tylenol [acetaminophen]    Liver problems        Medication List     STOP taking these medications    metoprolol tartrate 25 MG tablet Commonly known as: LOPRESSOR       TAKE these medications    amLODipine 5 MG tablet Commonly known as: NORVASC Take 1 tablet (5 mg total) by mouth daily.   aspirin EC 81 MG  tablet Take 1 tablet (81 mg total) by mouth daily. Swallow whole.   atorvastatin 40 MG tablet Commonly known as: LIPITOR Take 1 tablet (40 mg total) by mouth daily. Start taking on: August 22, 2020   clopidogrel 75 MG tablet Commonly known as: PLAVIX Take 1 tablet (75 mg total) by mouth daily with breakfast. Start taking on: August 22, 2020   multivitamin tablet Take 1 tablet by mouth daily.    nitroGLYCERIN 0.4 MG SL tablet Commonly known as: NITROSTAT Place 1 tablet (0.4 mg total) under the tongue every 5 (five) minutes x 3 doses as needed for chest pain.        If you experience worsening of your admission symptoms, develop shortness of breath, life threatening emergency, suicidal or homicidal thoughts you must seek medical attention immediately by calling 911 or calling your MD immediately  if symptoms less severe.  You Must read complete instructions/literature along with all the possible adverse reactions/side effects for all the Medicines you take and that have been prescribed to you. Take any new Medicines after you have completely understood and accept all the possible adverse reactions/side effects.   Please note  You were cared for by a hospitalist during your hospital stay. If you have any questions about your discharge medications or the care you received while you were in the hospital after you are discharged, you can call the unit and asked to speak with the hospitalist on call if the hospitalist that took care of you is not available. Once you are discharged, your primary care physician will handle any further medical issues. Please note that NO REFILLS for any discharge medications will be authorized once you are discharged, as it is imperative that you return to your primary care physician (or establish a relationship with a primary care physician if you do not have one) for your aftercare needs so that they can reassess your need for medications and monitor your lab values. Today   SUBJECTIVE   Doing well. Anxious to go home!  VITAL SIGNS:  Blood pressure 112/68, pulse (!) 55, temperature 97.6 F (36.4 C), resp. rate 18, height  (1.803 m), weight 112.8 kg, SpO2 100 %.  I/O:   Intake/Output Summary (Last 24 hours) at 08/21/2020 0812 Last data filed at 08/21/2020 0423 Gross per 24 hour  Intake 2419.42 ml  Output 550 ml  Net 1869.42 ml    PHYSICAL  EXAMINATION:  GENERAL:  35 y.o.-year-old patient lying in the bed with no acute distress.  EYES: Pupils equal, round, reactive to light and accommodation. No scleral icterus.  HEENT: Head atraumatic, normocephalic. Oropharynx and nasopharynx clear.  NECK:  Supple, no jugular venous distention. No thyroid enlargement, no tenderness.  LUNGS: Normal breath sounds bilaterally, no wheezing, rales,rhonchi or crepitation. No use of accessory muscles of respiration.  CARDIOVASCULAR: S1, S2 normal. No murmurs, rubs, or gallops.  ABDOMEN: Soft, non-tender, non-distended. Bowel sounds present. No organomegaly or mass.  EXTREMITIES: No pedal edema, cyanosis, or clubbing.  NEUROLOGIC: Cranial nerves II through XII are intact. Muscle strength 5/5 in all extremities. Sensation intact. Gait not checked.  PSYCHIATRIC: The patient is alert and oriented x 3.  SKIN: No obvious rash, lesion, or ulcer.   DATA REVIEW:   CBC  Recent Labs  Lab 08/21/20 0437  WBC 4.4  HGB 12.8*  HCT 38.5*  PLT 198    Chemistries  Recent Labs  Lab 08/20/20 0135 08/20/20 0645 08/21/20 0437  NA  --  138  --  K  --  3.6  --   CL  --  104  --   CO2  --  27  --   GLUCOSE  --  100*  --   BUN  --  10  --   CREATININE  --  1.11  --   CALCIUM  --  8.5*  --   MG 2.1  --   --   AST  --  110* 89*  ALT  --  71* 67*  ALKPHOS  --  39 37*  BILITOT  --  0.7 0.5    Microbiology Results   Recent Results (from the past 240 hour(s))  Resp Panel by RT-PCR (Flu A&B, Covid) Nasopharyngeal Swab     Status: None   Collection Time: 08/20/20  1:01 AM   Specimen: Nasopharyngeal Swab; Nasopharyngeal(NP) swabs in vial transport medium  Result Value Ref Range Status   SARS Coronavirus 2 by RT PCR NEGATIVE NEGATIVE Final    Comment: (NOTE) SARS-CoV-2 target nucleic acids are NOT DETECTED.  The SARS-CoV-2 RNA is generally detectable in upper respiratory specimens during the acute phase of infection. The lowest concentration of  SARS-CoV-2 viral copies this assay can detect is 138 copies/mL. A negative result does not preclude SARS-Cov-2 infection and should not be used as the sole basis for treatment or other patient management decisions. A negative result may occur with  improper specimen collection/handling, submission of specimen other than nasopharyngeal swab, presence of viral mutation(s) within the areas targeted by this assay, and inadequate number of viral copies(<138 copies/mL). A negative result must be combined with clinical observations, patient history, and epidemiological information. The expected result is Negative.  Fact Sheet for Patients:  BloggerCourse.com  Fact Sheet for Healthcare Providers:  SeriousBroker.it  This test is no t yet approved or cleared by the [place] FDA and  has been authorized for detection and/or diagnosis of SARS-CoV-2 by FDA under an Emergency Use Authorization (EUA). This EUA will remain  in effect (meaning this test can be used) for the duration of the COVID-19 declaration under Section 564(b)(1) of the Act, 21 U.S.C.section 360bbb-3(b)(1), unless the authorization is terminated  or revoked sooner.       Influenza A by PCR NEGATIVE NEGATIVE Final   Influenza B by PCR NEGATIVE NEGATIVE Final    Comment: (NOTE) The Xpert Xpress SARS-CoV-2/FLU/RSV plus assay is intended as an aid in the diagnosis of influenza from Nasopharyngeal swab specimens and should not be used as a sole basis for treatment. Nasal washings and aspirates are unacceptable for Xpert Xpress SARS-CoV-2/FLU/RSV testing.  Fact Sheet for Patients: BloggerCourse.com  Fact Sheet for Healthcare Providers: SeriousBroker.it  This test is not yet approved or cleared by the [place] FDA and has been authorized for detection and/or diagnosis of SARS-CoV-2 by FDA under an Emergency Use  Authorization (EUA). This EUA will remain in effect (meaning this test can be used) for the duration of the COVID-19 declaration under Section 564(b)(1) of the Act, 21 U.S.C. section 360bbb-3(b)(1), unless the authorization is terminated or revoked.  Performed at [facility], [address] [place], [place] [address]     RADIOLOGY:  DG Chest 2 View  Result Date: 08/20/2020 CLINICAL DATA:  Chest pain EXAM: CHEST - 2 VIEW COMPARISON:  06/24/2020 FINDINGS: The heart size and mediastinal contours are within normal limits. Both lungs are clear. The visualized skeletal structures are unremarkable. IMPRESSION: Negative. Electronically Signed   By: Charlett Nose M.D.   On:  08/20/2020 00:11   CT Angio Chest PE W/Cm &/Or Wo Cm  Result Date: 08/20/2020 CLINICAL DATA:  Chest pain EXAM: CT ANGIOGRAPHY CHEST WITH CONTRAST TECHNIQUE: Multidetector CT imaging of the chest was performed using the standard protocol during bolus administration of intravenous contrast. Multiplanar CT image reconstructions and MIPs were obtained to evaluate the vascular anatomy. CONTRAST:  OMNIPAQUE IOHEXOL 350 MG/ML SOLN COMPARISON:  None. FINDINGS: Cardiovascular: Satisfactory opacification of the pulmonary arteries to the segmental level. No evidence of pulmonary embolism. Normal heart size. No pericardial effusion. Mediastinum/Nodes: No enlarged mediastinal, hilar, or axillary lymph nodes. Thyroid gland, trachea, and esophagus demonstrate no significant findings. Residual thymic tissue within the anterior mediastinum. Lungs/Pleura: Lungs are clear. No pleural effusion or pneumothorax. Upper Abdomen: No acute abnormality. Musculoskeletal: No acute bone abnormality. Mild degenerative changes are noted within the midthoracic spine with a limbus vertebra noted at T11, a benign finding. Review of the MIP images confirms the above findings. IMPRESSION: No pulmonary embolism. No acute intrathoracic pathology identified. No  definite radiographic explanation for the patient's symptoms. Electronically Signed   By: Helyn Numbers MD   On: 08/20/2020 02:50   CARDIAC CATHETERIZATION  Result Date: 08/20/2020  RPDA lesion is 100% stenosed.  The left ventricular systolic function is normal.  LV end diastolic pressure is mildly elevated.  The left ventricular ejection fraction is 55-65% by visual estimate.  1.  No significant coronary artery disease noted in the major arteries.  The right PDA seems to be occluded distally.  This is the likely culprit for non-STEMI. 2.  Normal LV systolic function mildly elevated left ventricular end-diastolic pressure. Recommendations: The patient's coronary arteries are overall normal except the distal portion of the right PDA.  Suspect localized plaque rupture or vasospasm leading to occlusion.  Recommend medical therapy. Will place the patient on clopidogrel for a year. Continue treatment with a statin. I switched metoprolol to amlodipine. I recommended that he quit tobacco use. Possible discharge home tomorrow if he remains stable.   US Abdomen Limited RUQ (LIVER/GB)  Result Date: 08/20/2020 CLINICAL DATA:  35 year old male with chest and right upper quadrant pain for several hours. EXAM: ULTRASOUND ABDOMEN LIMITED RIGHT UPPER QUADRANT COMPARISON:  Ultrasound 04/09/2020. CT Abdomen and Pelvis 08/29/2019. FINDINGS: Gallbladder: Contracted, similar appearance to the prior exams. No echogenic sludge or stones identified. Gallbladder wall thickness within normal limits at 2-3 mm. No pericholecystic fluid. No sonographic Murphy sign elicited. Common bile duct: Diameter: 3-4 mm, normal. Liver: Liver echogenicity is at the upper limits of normal (image 38). No discrete liver lesion. No intrahepatic biliary ductal dilatation. Portal vein is patent on color Doppler imaging with normal direction of blood flow towards the liver. Other: Negative visible right kidney. IMPRESSION: Essentially negative right  upper quadrant ultrasound. No explanation for pain. Electronically Signed   By: Odessa Fleming M.D.   On: 08/20/2020 04:52     CODE STATUS:     Code Status Orders  (From admission, onward)           Start     Ordered   08/20/20 0134  Full code  Continuous        08/20/20 0136           Code Status History     Date Active Date Inactive Code Status Order ID Comments User Context   08/11/2016 0330 08/11/2016 1719 Full Code [id number]  Hugelmeyer, Jon Gills, DO Inpatient        TOTAL TIME TAKING CARE OF THIS  PATIENT: 35 minutes.    Enedina FinnerSona Teleah Villamar M.D  [facility]    CC: Primary care physician; Patient, No Pcp Per (Inactive)

## 2020-08-24 ENCOUNTER — Encounter: Payer: Self-pay | Admitting: Cardiovascular Disease

## 2020-08-24 ENCOUNTER — Telehealth: Payer: Self-pay | Admitting: *Deleted

## 2020-08-24 NOTE — Telephone Encounter (Signed)
noted 

## 2020-08-24 NOTE — Telephone Encounter (Signed)
-----   Message from Lennie Odor, RN sent at 08/24/2020  7:36 AM EDT ----- Regarding: RE: Cancel CTA Thank you for the follow up. I have cancelled his appt and cancelled the CCTA order Benjamin Beltran ----- Message ----- From: Yvonne Kendall, MD Sent: 08/21/2020  12:20 PM EDT To: Lennie Odor, RN, [facility] [place] Triage Subject: Cancel CTA                                     Hello,  Benjamin Beltran is scheduled for coronary CTA on 7/7.  However, he was admitted with NSTEMI and underwent catheterization by Dr. Kirke Corin yesterday.  Could you reach out to the patient and cancel the CTA, as I do not think it is indicated in the setting of recent catheterization?  He should follow-up with Eula Listen, PA, as schedule don 7/13.  Thanks for your help.  Thayer Ohm

## 2020-08-26 ENCOUNTER — Ambulatory Visit: Admission: RE | Admit: 2020-08-26 | Payer: Medicaid Other | Source: Ambulatory Visit

## 2020-08-27 NOTE — Progress Notes (Deleted)
Cardiology Office Note    Date:  08/27/2020   ID:  [name], DOB July 21, 1985, MRN [id number]  PCP:  Patient, No Pcp Per (Inactive)  Cardiologist:  Yvonne Kendall, MD  Electrophysiologist:  None   Chief Complaint: Hospital follow-up  History of Present Illness:   Benjamin Beltran is a 35 y.o. male with history of hypertension, hyperlipidemia, abnormal LFTs, tobacco, vape, and marijuana usage who presents for hospital follow-up as outlined below.  He was evaluated in the ED in 06/2020 with chest pain and abrasion of the left eye.  At that time, high-sensitivity troponin peaked at 44 and was subsequently downtrending.  He was evaluated by Dr. Okey Dupre in 07/2020 in the setting of a history of sharp and stabbing central chest pain, that typically occurred at rest, and improved with activity including vigorous exercise.  EKG at that time showed mild ST elevation in leads I and aVL with inferior and anterolateral T wave inversion.  Echo demonstrated a preserved LV systolic function without regional wall motion abnormalities or significant valvular disease.  Recommendation was for initiation of medical therapy and to pursue coronary CTA.  However, prior to completing coronary CTA he was admitted to the hospital on 7/1 through 7/2 with an NSTEMI with high-sensitivity troponin peaking at 2087.  LHC showed no significant CAD noted in the major arteries with the RPDA occluded distally and felt to be the likely culprit for his non-STEMI and felt to be related to plaque rupture or vasospasm.  Normal LV systolic function with a mildly elevated LVEDP was noted.  Medical therapy was recommended.  ***   Labs independently reviewed: 08/2020 - Hgb 12.8, PLT 198, albumin 3.4, AST 89, ALT 67, TC 167, TG 46, HDL 40, LDL 118, potassium 3.6, BUN 10, serum creatinine 1.11, magnesium 2.1  Past Medical History:  Diagnosis Date   Chest pain    a. 07/2020 Echo: EF 60-65%, no rwma, nl RV fxn. No significant valvular dzs.    Elevated liver function tests unk   Essential hypertension    Rhabdomyolysis    Tobacco abuse    Vapes nicotine containing substance     Past Surgical History:  Procedure Laterality Date   LEFT HEART CATH AND CORONARY ANGIOGRAPHY N/A 08/20/2020   Procedure: LEFT HEART CATH AND CORONARY ANGIOGRAPHY;  Surgeon: Benjamin Ouch, MD;  Location: [facility] INVASIVE CV LAB;  Service: Cardiovascular;  Laterality: N/A;   NECK SURGERY     tendon surgery to left hand  unk    Current Medications: No outpatient medications have been marked as taking for the 09/01/20 encounter (Appointment) with Sondra Barges, PA-C.    Allergies:   Antihistamines, diphenhydramine-type and Tylenol [acetaminophen]   Social History   Socioeconomic History   Marital status: Married    Spouse name: Not on file   Number of children: Not on file   Years of education: Not on file   Highest education level: Not on file  Occupational History   Not on file  Tobacco Use   Smoking status: Former    Packs/day: 0.50    Pack years: 0.00    Types: Cigarettes   Smokeless tobacco: Never  Vaping Use   Vaping Use: Every day   Devices: 1 cartridge q 2 days  Substance and Sexual Activity   Alcohol use: No   Drug use: Yes    Types: Marijuana    Comment: occassionally   Sexual activity: Yes    Partners: Female  Other  Topics Concern   Not on file  Social History Narrative   Lives locally w/ wife and children. Exercises regularly.   Social Determinants of Health   Financial Resource Strain: High Risk   Difficulty of Paying Living Expenses: Hard  Food Insecurity: Food Insecurity Present   Worried About Running Out of Food in the Last Year: Sometimes true   Ran Out of Food in the Last Year: Sometimes true  Transportation Needs: No Transportation Needs   Lack of Transportation (Medical): No   Lack of Transportation (Non-Medical): No  Physical Activity: Not on file  Stress: Not on file  Social Connections: Not on file      Family History:  The patient's family history includes Diabetes in his mother; Irregular heart beat in his mother.  ROS:   ROS   EKGs/Labs/Other Studies Reviewed:    Studies reviewed were summarized above. The additional studies were reviewed today:  2D echo 07/21/2020:  1. Left ventricular ejection fraction, by estimation, is 60 to 65%. The  left ventricle has normal function. The left ventricle has no regional  wall motion abnormalities. Left ventricular diastolic parameters were  normal. The average left ventricular  global longitudinal strain is -17.2 %. The global longitudinal strain is  normal.   2. Right ventricular systolic function is normal. The right ventricular  size is normal.   3. The mitral valve is normal in structure. No evidence of mitral valve  regurgitation. No evidence of mitral stenosis.  __________  LHC 08/20/2020: RPDA lesion is 100% stenosed. The left ventricular systolic function is normal. LV end diastolic pressure is mildly elevated. The left ventricular ejection fraction is 55-65% by visual estimate.   1.  No significant coronary artery disease noted in the major arteries.  The right PDA seems to be occluded distally.  This is the likely culprit for non-STEMI. 2.  Normal LV systolic function mildly elevated left ventricular end-diastolic pressure.   Recommendations: The patient's coronary arteries are overall normal except the distal portion of the right PDA.  Suspect localized plaque rupture or vasospasm leading to occlusion.  Recommend medical therapy. Will place the patient on clopidogrel for a year. Continue treatment with a statin. I switched metoprolol to amlodipine. I recommended that he quit tobacco use. Possible discharge home tomorrow if he remains stable.   EKG:  EKG is ordered today.  The EKG ordered today demonstrates ***  Recent Labs: 08/20/2020: BUN 10; Creatinine, Ser 1.11; Magnesium 2.1; Potassium 3.6; Sodium 138 08/21/2020:  ALT 67; Hemoglobin 12.8; Platelets 198  Recent Lipid Panel    Component Value Date/Time   CHOL 167 08/20/2020 0645   TRIG 46 08/20/2020 0645   HDL 40 (L) 08/20/2020 0645   CHOLHDL 4.2 08/20/2020 0645   VLDL 9 08/20/2020 0645   LDLCALC 118 (H) 08/20/2020 0645    PHYSICAL EXAM:    VS:  There were no vitals taken for this visit.  BMI: There is no height or weight on file to calculate BMI.  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  Nicotine/marijuana use:  Abnormal LFTs:  Disposition: F/u with Dr. Okey Dupre or an APP in ***.   Medication Adjustments/Labs and Tests  Ordered: Current medicines are reviewed at length with the patient today.  Concerns regarding medicines are outlined above. Medication changes, Labs and Tests ordered today are summarized above and listed in the Patient Instructions accessible in Encounters.   Signed, Eula Listen, PA-C 08/27/2020 2:36 PM     [facility] [place] [address] [place], [place] [address] [phone]

## 2020-09-01 ENCOUNTER — Ambulatory Visit: Payer: Self-pay | Admitting: Physician Assistant

## 2020-10-13 NOTE — Progress Notes (Signed)
Cardiology Office Note    Date:  10/14/2020   ID:  Benjamin Beltran, DOB 1985-10-19, MRN [id number]  PCP:  Patient, No Pcp Per (Inactive)  Cardiologist:  Yvonne Kendall, MD  Electrophysiologist:  None   Chief Complaint: Hospital follow-up  History of Present Illness:   Benjamin Beltran is a 35 y.o. male with history of CAD with NSTEMI in 08/2020 medically managed as outlined below, hypertension, tobacco/vape use, occasional marijuana use, abnormal LFTs, and chest pain who presents for hospital follow-up as outlined below.  He was evaluated in our office in 07/2020 with history of sharp and stabbing central chest pain that occurred at rest and improved with activity, including vigorous exercise.  It was noted he had previously been evaluated in the ED for similar symptoms with a mildly elevated high-sensitivity troponin with a flat trend of 40.  EKG at the time of his office visit showed mild ST elevation in leads I and aVL with inferior and anterolateral T wave version.  Echo was performed in the office which showed normal LV systolic function without regional wall motion abnormalities or significant valvular disease.  Recommendation was made to proceed with coronary CTA.  However, prior to completion of this study he presented to [facility] on 7/1 and was admitted through 7/2 with an NSTEMI.  High-sensitivity troponin peaked at 2087.  LHC showed no significant CAD noted in the major arteries with 100% stenosis of the rPDA that appeared to be distal and was the likely culprit for his NSTEMI.  This occlusion was suspected to be localized plaque rupture versus vasospasm.  Normal LV systolic function with mildly elevated LVEDP.  Medical therapy was recommended.  He comes in doing well from a cardiac perspective.  Shortly after his hospital discharge he did continue to note brief randomly occurring episodes of chest discomfort that have subsequently improved following the discontinuation of vape and smoking  tobacco.  He is currently on bupropion and using nicotine pouches to assist with tapering of nicotine.  He did not take amlodipine or atorvastatin secondary to side effect profile.  He has been out of clopidogrel.  He is back to exercising on a regular basis without cardiac limitation.  He is working on eating a healthy diet.   Labs independently reviewed: 08/2020 - Hgb 12.8, PLT 198, albumin 3.4, AST 89 (previously 110), ALT 67 (previously 71), TC 167, TG 46, HDL 40, LDL 118, magnesium 2.1  Past Medical History:  Diagnosis Date   Chest pain    a. 07/2020 Echo: EF 60-65%, no rwma, nl RV fxn. No significant valvular dzs.   Elevated liver function tests unk   Essential hypertension    Rhabdomyolysis    Tobacco abuse    Vapes nicotine containing substance     Past Surgical History:  Procedure Laterality Date   LEFT HEART CATH AND CORONARY ANGIOGRAPHY N/A 08/20/2020   Procedure: LEFT HEART CATH AND CORONARY ANGIOGRAPHY;  Surgeon: Iran Ouch, MD;  Location: [facility] INVASIVE CV LAB;  Service: Cardiovascular;  Laterality: N/A;   NECK SURGERY     tendon surgery to left hand  unk    Current Medications: Current Meds  Medication Sig   aspirin EC 81 MG tablet Take 1 tablet (81 mg total) by mouth daily. Swallow whole.   buPROPion (WELLBUTRIN XL) 150 MG 24 hr tablet Take 150 mg by mouth daily.   nitroGLYCERIN (NITROSTAT) 0.4 MG SL tablet Place 1 tablet (0.4 mg total) under the tongue every 5 (five)  minutes x 3 doses as needed for chest pain.   [DISCONTINUED] clopidogrel (PLAVIX) 75 MG tablet Take 1 tablet (75 mg total) by mouth daily with breakfast.    Allergies:   Antihistamines, diphenhydramine-type and Tylenol [acetaminophen]   Social History   Socioeconomic History   Marital status: Married    Spouse name: Not on file   Number of children: Not on file   Years of education: Not on file   Highest education level: Not on file  Occupational History   Not on file  Tobacco Use    Smoking status: Former    Packs/day: 0.50    Types: Cigarettes   Smokeless tobacco: Never  Vaping Use   Vaping Use: Every day   Devices: 1 cartridge q 2 days  Substance and Sexual Activity   Alcohol use: No   Drug use: Yes    Types: Marijuana    Comment: occassionally   Sexual activity: Yes    Partners: Female  Other Topics Concern   Not on file  Social History Narrative   Lives locally w/ wife and children. Exercises regularly.   Social Determinants of Health   Financial Resource Strain: High Risk   Difficulty of Paying Living Expenses: Hard  Food Insecurity: Food Insecurity Present   Worried About Running Out of Food in the Last Year: Sometimes true   Ran Out of Food in the Last Year: Sometimes true  Transportation Needs: No Transportation Needs   Lack of Transportation (Medical): No   Lack of Transportation (Non-Medical): No  Physical Activity: Not on file  Stress: Not on file  Social Connections: Not on file     Family History:  The patient's family history includes Diabetes in his mother; Irregular heart beat in his mother.  ROS:   Review of Systems  Constitutional:  Negative for chills, diaphoresis, fever, malaise/fatigue and weight loss.  HENT:  Negative for congestion.   Eyes:  Negative for discharge and redness.  Respiratory:  Negative for cough, sputum production, shortness of breath and wheezing.   Cardiovascular:  Positive for chest pain. Negative for palpitations, orthopnea, claudication, leg swelling and PND.       Improved chest discomfort  Gastrointestinal:  Negative for abdominal pain, heartburn, nausea and vomiting.  Musculoskeletal:  Negative for falls and myalgias.  Skin:  Negative for rash.  Neurological:  Negative for dizziness, tingling, tremors, sensory change, speech change, focal weakness, loss of consciousness and weakness.  Endo/Heme/Allergies:  Does not bruise/bleed easily.  Psychiatric/Behavioral:  Negative for substance abuse. The  patient is not nervous/anxious.   All other systems reviewed and are negative.   EKGs/Labs/Other Studies Reviewed:    Studies reviewed were summarized above. The additional studies were reviewed today:  2D echo 07/2020: 1. Left ventricular ejection fraction, by estimation, is 60 to 65%. The  left ventricle has normal function. The left ventricle has no regional  wall motion abnormalities. Left ventricular diastolic parameters were  normal. The average left ventricular  global longitudinal strain is -17.2 %. The global longitudinal strain is  normal.   2. Right ventricular systolic function is normal. The right ventricular  size is normal.   3. The mitral valve is normal in structure. No evidence of mitral valve  regurgitation. No evidence of mitral stenosis. __________  LHC 08/2020: RPDA lesion is 100% stenosed. The left ventricular systolic function is normal. LV end diastolic pressure is mildly elevated. The left ventricular ejection fraction is 55-65% by visual estimate.   1.  No significant coronary artery disease noted in the major arteries.  The right PDA seems to be occluded distally.  This is the likely culprit for non-STEMI. 2.  Normal LV systolic function mildly elevated left ventricular end-diastolic pressure.   Recommendations: The patient's coronary arteries are overall normal except the distal portion of the right PDA.  Suspect localized plaque rupture or vasospasm leading to occlusion.  Recommend medical therapy. Will place the patient on clopidogrel for a year. Continue treatment with a statin. I switched metoprolol to amlodipine. I recommended that he quit tobacco use. Possible discharge home tomorrow if he remains stable.   EKG:  EKG is ordered today.  The EKG ordered today demonstrates sinus bradycardia, 53 bpm, inferolateral T wave version consistent with prior tracing  Recent Labs: 08/20/2020: BUN 10; Creatinine, Ser 1.11; Magnesium 2.1; Potassium 3.6;  Sodium 138 08/21/2020: ALT 67; Hemoglobin 12.8; Platelets 198  Recent Lipid Panel    Component Value Date/Time   CHOL 167 08/20/2020 0645   TRIG 46 08/20/2020 0645   HDL 40 (L) 08/20/2020 0645   CHOLHDL 4.2 08/20/2020 0645   VLDL 9 08/20/2020 0645   LDLCALC 118 (H) 08/20/2020 0645    PHYSICAL EXAM:    VS:  BP 120/72 (BP Location: Left Arm, Patient Position: Sitting, Cuff Size: Large)   Pulse (!) 53   Ht 5\' 11"  (1.803 m)   Wt 243 lb (110.2 kg)   SpO2 98%   BMI 33.89 kg/m   BMI: Body mass index is 33.89 kg/m.  Physical Exam Vitals reviewed.  Constitutional:      Appearance: He is well-developed.  HENT:     Head: Normocephalic and atraumatic.  Eyes:     General:        Right eye: No discharge.        Left eye: No discharge.  Neck:     Vascular: No JVD.  Cardiovascular:     Rate and Rhythm: Normal rate and regular rhythm.     Pulses:          Posterior tibial pulses are 2+ on the right side and 2+ on the left side.     Heart sounds: Normal heart sounds, S1 normal and S2 normal. Heart sounds not distant. No midsystolic click and no opening snap. No murmur heard.   No friction rub.  Pulmonary:     Effort: Pulmonary effort is normal. No respiratory distress.     Breath sounds: Normal breath sounds. No decreased breath sounds, wheezing or rales.  Chest:     Chest wall: No tenderness.  Abdominal:     General: There is no distension.     Palpations: Abdomen is soft.     Tenderness: There is no abdominal tenderness.  Musculoskeletal:     Cervical back: Normal range of motion.  Skin:    General: Skin is warm and dry.     Nails: There is no clubbing.  Neurological:     Mental Status: He is alert and oriented to person, place, and time.  Psychiatric:        Speech: Speech normal.        Behavior: Behavior normal.        Thought Content: Thought content normal.        Judgment: Judgment normal.    Wt Readings from Last 3 Encounters:  10/14/20 243 lb (110.2 kg)   08/20/20 248 lb 9.6 oz (112.8 kg)  07/21/20 243 lb (110.2 kg)     ASSESSMENT &  PLAN:   CAD involving the native coronary arteries with recent NSTEMI without angina: He is doing well without symptoms concerning for angina.  He did run out of clopidogrel, which will be refilled and should be continued for 12 months from date of LHC.  Given he did not undergo stenting he will not be reloaded.  He will continue ASA 81 mg.  He declines resumption of amlodipine or trial of Imdur for treatment of possible vasospasm at this time.  No indication for further ischemic testing at this time.  HTN: Blood pressure is well controlled in the office today.  Not currently requiring medical therapy.  HLD: LDL 118 in 08/2020, not on statin therapy at that time.  Goal LDL less than 70.  Not currently on atorvastatin.  Prefers to work on heart healthy diet and regular exercise.  Check fasting lipid panel and LFT at next visit.  If LDL remains above goal consider reinitiation of statin therapy.  History of abnormal LFTs: Improving on most recent check in early 08/2020.  Appears to possibly be in the setting of prior excessive alcohol use, which she has subsequently discontinued.  Check LFTs at next visit.  Tobacco/marijuana use: No longer vaping or smoking tobacco.  Currently on bupropion and using nicotine patch to assist with tapering of nicotine.  Complete cessation recommended to  Disposition: F/u with Dr. Okey Dupre or an APP in 3 months.   Medication Adjustments/Labs and Tests Ordered: Current medicines are reviewed at length with the patient today.  Concerns regarding medicines are outlined above. Medication changes, Labs and Tests ordered today are summarized above and listed in the Patient Instructions accessible in Encounters.   Signed, Eula Listen, PA-C 10/14/2020 4:28 PM     [facility] [place] [address] [place], [place] [address] [phone]

## 2020-10-14 ENCOUNTER — Ambulatory Visit (INDEPENDENT_AMBULATORY_CARE_PROVIDER_SITE_OTHER): Payer: Self-pay | Admitting: Physician Assistant

## 2020-10-14 ENCOUNTER — Encounter: Payer: Self-pay | Admitting: Physician Assistant

## 2020-10-14 ENCOUNTER — Other Ambulatory Visit: Payer: Self-pay

## 2020-10-14 VITALS — BP 120/72 | HR 53 | Ht 71.0 in | Wt 243.0 lb

## 2020-10-14 DIAGNOSIS — Z87891 Personal history of nicotine dependence: Secondary | ICD-10-CM

## 2020-10-14 DIAGNOSIS — E785 Hyperlipidemia, unspecified: Secondary | ICD-10-CM

## 2020-10-14 DIAGNOSIS — R945 Abnormal results of liver function studies: Secondary | ICD-10-CM

## 2020-10-14 DIAGNOSIS — I2510 Atherosclerotic heart disease of native coronary artery without angina pectoris: Secondary | ICD-10-CM

## 2020-10-14 DIAGNOSIS — R7989 Other specified abnormal findings of blood chemistry: Secondary | ICD-10-CM

## 2020-10-14 DIAGNOSIS — I214 Non-ST elevation (NSTEMI) myocardial infarction: Secondary | ICD-10-CM

## 2020-10-14 DIAGNOSIS — I10 Essential (primary) hypertension: Secondary | ICD-10-CM

## 2020-10-14 MED ORDER — CLOPIDOGREL BISULFATE 75 MG PO TABS
75.0000 mg | ORAL_TABLET | Freq: Every day | ORAL | 3 refills | Status: DC
Start: 2020-10-14 — End: 2021-02-28

## 2020-10-14 NOTE — Patient Instructions (Signed)
Medication Instructions:   Please continue your current medications Plavix was refilled and sent to pharmacy  *If you need a refill on your cardiac medications before your next appointment, please call your pharmacy*  Lab Work: None  Testing/Procedures: None  Follow-Up: At [facility], you and your health needs are our priority.  As part of our continuing mission to provide you with exceptional heart care, we have created designated Provider Care Teams.  These Care Teams include your primary Cardiologist (physician) and Advanced Practice Providers (APPs -  Physician Assistants and Nurse Practitioners) who all work together to provide you with the care you need, when you need it.  Your next appointment:   3 month(s)  The format for your next appointment:   In Person  Provider:   Eula Listen, PA-C

## 2020-10-26 ENCOUNTER — Emergency Department: Payer: Medicaid Other

## 2020-10-26 ENCOUNTER — Encounter: Payer: Self-pay | Admitting: Emergency Medicine

## 2020-10-26 ENCOUNTER — Other Ambulatory Visit: Payer: Self-pay

## 2020-10-26 DIAGNOSIS — I10 Essential (primary) hypertension: Secondary | ICD-10-CM | POA: Insufficient documentation

## 2020-10-26 DIAGNOSIS — E876 Hypokalemia: Secondary | ICD-10-CM | POA: Insufficient documentation

## 2020-10-26 DIAGNOSIS — F419 Anxiety disorder, unspecified: Secondary | ICD-10-CM | POA: Insufficient documentation

## 2020-10-26 DIAGNOSIS — Z79899 Other long term (current) drug therapy: Secondary | ICD-10-CM | POA: Insufficient documentation

## 2020-10-26 DIAGNOSIS — N433 Hydrocele, unspecified: Secondary | ICD-10-CM | POA: Insufficient documentation

## 2020-10-26 DIAGNOSIS — Z87891 Personal history of nicotine dependence: Secondary | ICD-10-CM | POA: Insufficient documentation

## 2020-10-26 DIAGNOSIS — R079 Chest pain, unspecified: Secondary | ICD-10-CM | POA: Insufficient documentation

## 2020-10-26 DIAGNOSIS — N503 Cyst of epididymis: Secondary | ICD-10-CM | POA: Insufficient documentation

## 2020-10-26 DIAGNOSIS — N50811 Right testicular pain: Secondary | ICD-10-CM

## 2020-10-26 DIAGNOSIS — Z7982 Long term (current) use of aspirin: Secondary | ICD-10-CM | POA: Insufficient documentation

## 2020-10-26 DIAGNOSIS — I861 Scrotal varices: Secondary | ICD-10-CM | POA: Insufficient documentation

## 2020-10-26 DIAGNOSIS — N5089 Other specified disorders of the male genital organs: Secondary | ICD-10-CM

## 2020-10-26 DIAGNOSIS — Z7902 Long term (current) use of antithrombotics/antiplatelets: Secondary | ICD-10-CM | POA: Insufficient documentation

## 2020-10-26 LAB — CBC
HCT: 38.3 % — ABNORMAL LOW (ref 39.0–52.0)
Hemoglobin: 13.1 g/dL (ref 13.0–17.0)
MCH: 28.4 pg (ref 26.0–34.0)
MCHC: 34.2 g/dL (ref 30.0–36.0)
MCV: 82.9 fL (ref 80.0–100.0)
Platelets: 210 10*3/uL (ref 150–400)
RBC: 4.62 MIL/uL (ref 4.22–5.81)
RDW: 12.8 % (ref 11.5–15.5)
WBC: 6.4 10*3/uL (ref 4.0–10.5)
nRBC: 0 % (ref 0.0–0.2)

## 2020-10-26 LAB — BASIC METABOLIC PANEL
Anion gap: 7 (ref 5–15)
BUN: 14 mg/dL (ref 6–20)
CO2: 26 mmol/L (ref 22–32)
Calcium: 9.1 mg/dL (ref 8.9–10.3)
Chloride: 103 mmol/L (ref 98–111)
Creatinine, Ser: 1.21 mg/dL (ref 0.61–1.24)
GFR, Estimated: >60 mL/min (ref >60)
Glucose, Bld: 109 mg/dL — ABNORMAL HIGH (ref 70–99)
Potassium: 3.2 mmol/L — ABNORMAL LOW (ref 3.5–5.1)
Sodium: 136 mmol/L (ref 135–145)

## 2020-10-26 LAB — TROPONIN I (HIGH SENSITIVITY): Troponin I (High Sensitivity): 64 ng/L — ABNORMAL HIGH (ref <18)

## 2020-10-26 NOTE — ED Triage Notes (Signed)
Pt reports that he developed chest pain this evening. It radiates down his arm and he has been nauseated with it. He also states that his right side of his groin pain that has grown in size and has become more painful.

## 2020-10-27 ENCOUNTER — Emergency Department
Admission: EM | Admit: 2020-10-27 | Discharge: 2020-10-27 | Disposition: A | Discharge status: Home / Self Care | Payer: Medicaid Other | Attending: Emergency Medicine | Admitting: Emergency Medicine

## 2020-10-27 DIAGNOSIS — I861 Scrotal varices: Secondary | ICD-10-CM

## 2020-10-27 DIAGNOSIS — N5089 Other specified disorders of the male genital organs: Secondary | ICD-10-CM

## 2020-10-27 DIAGNOSIS — N503 Cyst of epididymis: Secondary | ICD-10-CM

## 2020-10-27 DIAGNOSIS — N433 Hydrocele, unspecified: Secondary | ICD-10-CM

## 2020-10-27 DIAGNOSIS — E876 Hypokalemia: Secondary | ICD-10-CM

## 2020-10-27 DIAGNOSIS — N50811 Right testicular pain: Secondary | ICD-10-CM

## 2020-10-27 DIAGNOSIS — R079 Chest pain, unspecified: Secondary | ICD-10-CM

## 2020-10-27 LAB — URINALYSIS, COMPLETE (UACMP) WITH MICROSCOPIC
Bilirubin Urine: NEGATIVE
Glucose, UA: NEGATIVE mg/dL
Hgb urine dipstick: NEGATIVE
Ketones, ur: NEGATIVE mg/dL
Leukocytes,Ua: NEGATIVE
Nitrite: NEGATIVE
Protein, ur: NEGATIVE mg/dL
RBC / HPF: NONE SEEN RBC/hpf (ref 0–5)
Specific Gravity, Urine: 1.02 (ref 1.005–1.030)
Squamous Epithelial / HPF: NONE SEEN (ref 0–5)
pH: 6.5 (ref 5.0–8.0)

## 2020-10-27 LAB — TROPONIN I (HIGH SENSITIVITY): Troponin I (High Sensitivity): 62 ng/L — ABNORMAL HIGH (ref <18)

## 2020-10-27 MED ORDER — KETOROLAC TROMETHAMINE 60 MG/2ML IM SOLN
30.0000 mg | Freq: Once | INTRAMUSCULAR | Status: AC
  Administered 2020-10-27: 30 mg via INTRAMUSCULAR
  Filled 2020-10-27: qty 2

## 2020-10-27 MED ORDER — POTASSIUM CHLORIDE CRYS ER 20 MEQ PO TBCR
40.0000 meq | EXTENDED_RELEASE_TABLET | Freq: Once | ORAL | Status: AC
  Administered 2020-10-27: 40 meq via ORAL
  Filled 2020-10-27: qty 2

## 2020-10-27 MED ORDER — NAPROXEN 500 MG PO TABS: 500.0000 mg | ORAL_TABLET | Freq: Two times a day (BID) | ORAL | 0 refills | Status: DC

## 2020-10-27 NOTE — ED Provider Notes (Signed)
[facility] Emergency Department Provider Note   ____________________________________________   Event Date/Time   First MD Initiated Contact with Patient 10/27/20 0423     (approximate)  I have reviewed the triage vital signs and the nursing notes.   HISTORY  Chief Complaint Chest Pain and Groin Pain    HPI Benjamin Beltran is a 35 y.o. male who presents to the ED from home with a chief complaint of chest pain and right groin pain.  Patient with a history of non-STEMI in June of this year.  LHC demonstrated no significant CAD noted in the major arteries with 100% stenosis of the RPDA that appeared to be distal and suspected to be localized plaque rupture versus vasospasm.  Patient was treated medically.  Has not been taking amlodipine or Imdur as prescribed.  Had a cardiology office visit 10/14/2020 and they decided to let him modify his diet before resuming statins or the above medications.  Patient is taking his Plavix and Wellbutrin.  Reports chest pain x1 day because he is "stressed out" regarding right groin pain which has been ongoing for months.  Chest pain not associated with diaphoresis, shortness of breath, palpitations, nausea/vomiting or dizziness.  Reports right groin pain times several months, nontraumatic associated with swelling.  Denies urethral discharge, abdominal pain, dysuria, diarrhea.     Past Medical History:  Diagnosis Date  . Chest pain    a. 07/2020 Echo: EF 60-65%, no rwma, nl RV fxn. No significant valvular dzs.  . Elevated liver function tests unk  . Essential hypertension   . Rhabdomyolysis   . Tobacco abuse   . Vapes nicotine containing substance     Patient Active Problem List   Diagnosis Date Noted  . Tobacco abuse counseling   . Elevated LFTs   . Chest pain 08/20/2020  . NSTEMI (non-ST elevated myocardial infarction) (HCC)   . Hypertension   . Precordial pain 07/22/2020  . Shortness of breath 07/22/2020  . Abnormal  EKG 07/22/2020  . 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 Acute renal failure due to rhabdomyolysis (HCC) 08/11/2016    Past Surgical History:  Procedure Laterality Date  . LEFT HEART CATH AND CORONARY ANGIOGRAPHY N/A 08/20/2020   Procedure: LEFT HEART CATH AND CORONARY ANGIOGRAPHY;  Surgeon: Iran Ouch, MD;  Location: [facility] INVASIVE CV LAB;  Service: Cardiovascular;  Laterality: N/A;  . NECK SURGERY    . tendon surgery to left hand  unk    Prior to Admission medications   Medication Sig Start Date End Date Taking? Authorizing Provider  amLODipine (NORVASC) 5 MG tablet Take 1 tablet (5 mg total) by mouth daily. Patient not taking: Reported on 10/14/2020 08/21/20   Enedina Finner, MD  aspirin EC 81 MG tablet Take 1 tablet (81 mg total) by mouth daily. Swallow whole. 07/21/20   End, Cristal Deer, MD  atorvastatin (LIPITOR) 40 MG tablet Take 1 tablet (40 mg total) by mouth daily. Patient not taking: Reported on 10/14/2020 08/22/20   Enedina Finner, MD  buPROPion (WELLBUTRIN XL) 150 MG 24 hr tablet Take 150 mg by mouth daily. 10/05/20   [provider]  clopidogrel (PLAVIX) 75 MG tablet Take 1 tablet (75 mg total) by mouth daily with breakfast. 10/14/20   Dunn, Raymon Mutton, PA-C  nitroGLYCERIN (NITROSTAT) 0.4 MG SL tablet Place 1 tablet (0.4 mg total) under the tongue every 5 (five) minutes x 3 doses as needed for chest pain. 08/21/20   Enedina Finner, MD    Allergies  Antihistamines, diphenhydramine-type and Tylenol [acetaminophen]  Family History  Problem Relation Age of Onset  . Diabetes Mother   . Irregular heart beat Mother     Social History Social History   Tobacco Use  . Smoking status: Former    Packs/day: 0.50    Types: Cigarettes  . Smokeless tobacco: Never  Vaping Use  . Vaping Use: Every day  . Devices: 1 cartridge q 2 days  Substance Use Topics  . Alcohol use: No  . Drug use: Yes    Types: Marijuana    Comment: occassionally    Review of Systems  Constitutional: No  fever/chills Eyes: No visual changes. ENT: No sore throat. Cardiovascular: Positive for chest pain. Respiratory: Denies shortness of breath. Gastrointestinal: No abdominal pain.  No nausea, no vomiting.  No diarrhea.  No constipation. Genitourinary: Positive for right groin pain.  Negative for dysuria. Musculoskeletal: Negative for back pain. Skin: Negative for rash. Neurological: Negative for headaches, focal weakness or numbness.   ____________________________________________   PHYSICAL EXAM:  VITAL SIGNS: ED Triage Vitals  Enc Vitals Group     BP 10/26/20 2202 105/69     Pulse Rate 10/26/20 2202 68     Resp 10/26/20 2202 20     Temp 10/26/20 2202 98.4 F (36.9 C)     Temp Source 10/26/20 2202 Oral     SpO2 10/26/20 2202 98 %     Weight 10/26/20 2158 243 lb (110.2 kg)     Height 10/26/20 2158 5\' 11"  (1.803 m)     Head Circumference --      Peak Flow --      Pain Score 10/26/20 2158 8     Pain Loc --      Pain Edu? --      Excl. in GC? --     Constitutional: Alert and oriented. Well appearing and in no acute distress. Eyes: Conjunctivae are normal. PERRL. EOMI. Head: Atraumatic. Nose: No congestion/rhinnorhea. Mouth/Throat: Mucous membranes are moist.   Neck: No stridor.   Cardiovascular: Normal rate, regular rhythm. Grossly normal heart sounds.  Good peripheral circulation. Respiratory: Normal respiratory effort.  No retractions. Lungs CTAB. Gastrointestinal: Soft and nontender to light or deep palpation. No distention. No abdominal bruits. No CVA tenderness. Genitourinary: Circumcised male, no urethral discharge.  Mild swelling to bilateral testicles.  Right testicle tender to palpation, not twisted.  Strong bilateral cremasteric reflexes. Musculoskeletal: No lower extremity tenderness nor edema.  No joint effusions. Neurologic:  Normal speech and language. No gross focal neurologic deficits are appreciated. No gait instability. Skin:  Skin is warm, dry and intact.  No rash noted. Psychiatric: Mood and affect are mildly anxious. Speech and behavior are normal.  ____________________________________________   LABS (all labs ordered are listed, but only abnormal results are displayed)  Labs Reviewed  BASIC METABOLIC PANEL - Abnormal; Notable for the following components:      Result Value   Potassium 3.2 (*)    Glucose, Bld 109 (*)    All other components within normal limits  CBC - Abnormal; Notable for the following components:   HCT 38.3 (*)    All other components within normal limits  URINALYSIS, COMPLETE (UACMP) WITH MICROSCOPIC - Abnormal; Notable for the following components:   Bacteria, UA RARE (*)    All other components within normal limits  TROPONIN I (HIGH SENSITIVITY) - Abnormal; Notable for the following components:   Troponin I (High Sensitivity) 64 (*)    All other components within  normal limits  TROPONIN I (HIGH SENSITIVITY) - Abnormal; Notable for the following components:   Troponin I (High Sensitivity) 62 (*)    All other components within normal limits   ____________________________________________  EKG  ED ECG REPORT I, Earlean Fidalgo J, the attending physician, personally viewed and interpreted this ECG.   Date: 10/27/2020  EKG Time: 2156  Rate: 70  Rhythm: normal sinus rhythm  Axis: Normal  Intervals:none  ST&T Change: Nonspecific T wave changes  ____________________________________________  RADIOLOGY I, Jalila Goodnough J, personally viewed and evaluated these images (plain radiographs) as part of my medical decision making, as well as reviewing the written report by the radiologist.  ED MD interpretation: Bilateral hydroceles and varicoceles; 1.3 cm right epididymal cyst  Official radiology report(s): US SCROTUM W/DOPPLER  Result Date: 10/26/2020 CLINICAL DATA:  Right testicular pain, swelling EXAM: SCROTAL ULTRASOUND DOPPLER ULTRASOUND OF THE TESTICLES TECHNIQUE: Complete ultrasound examination of the testicles,  epididymis, and other scrotal structures was performed. Color and spectral Doppler ultrasound were also utilized to evaluate blood flow to the testicles. COMPARISON:  None. FINDINGS: Right testicle Measurements: 3.7 x 2.2 x 2.8 cm. No mass or microlithiasis visualized. Left testicle Measurements: 3.4 x 2.0 x 2.8 cm. No mass or microlithiasis visualized. Right epididymis:  1.3 cm cyst in the epididymal head. Left epididymis:  Normal in size and appearance. Hydrocele:  Moderate bilateral hydroceles. Varicocele:  Bilateral varicoceles. Pulsed Doppler interrogation of both testes demonstrates normal low resistance arterial and venous waveforms bilaterally. IMPRESSION: No evidence of testicular torsion or mass. Bilateral hydroceles and varicoceles. 1.3 cm cyst in the epididymal head. Electronically Signed   By: Charlett Nose M.D.   On: 10/26/2020 23:16    ____________________________________________   PROCEDURES  Procedure(s) performed (including Critical Care):  Procedures   ____________________________________________   INITIAL IMPRESSION / ASSESSMENT AND PLAN / ED COURSE  As part of my medical decision making, I reviewed the following data within the electronic MEDICAL RECORD NUMBER Nursing notes reviewed and incorporated, Labs reviewed, EKG interpreted, Old chart reviewed, Radiograph reviewed, and Notes from prior ED visits     35 year old male presenting with right groin pain and chest pain. Differential diagnosis includes, but is not limited to, ACS, aortic dissection, pulmonary embolism, cardiac tamponade, pneumothorax, pneumonia, pericarditis, myocarditis, GI-related causes including esophagitis/gastritis, and musculoskeletal chest wall pain.     2 sets of stable troponins, much improved from 2 months ago.  Will start NSAID for hydrocele cysts, varicocele, epididymal cyst.  Athletic supporter.  Refer to urology for outpatient follow-up.  Strict return precautions given.  Patient verbalizes  understanding and agrees with plan of care.      ____________________________________________   FINAL CLINICAL IMPRESSION(S) / ED DIAGNOSES  Final diagnoses:  Nonspecific chest pain  Varicocele  Hydrocele in adult  Epididymal cyst  Hypokalemia     ED Discharge Orders     None        Note:  This document was prepared using Dragon voice recognition software and may include unintentional dictation errors.    Irean Hong, MD 10/27/20 [phone]

## 2020-10-27 NOTE — Discharge Instructions (Addendum)
1.  You may take Naprosyn twice daily x7 days for pain. 2.  Wear athletic supporter while awake.  Elevate scrotum while resting at home. 3.  Return to the ER for worsening symptoms, persistent vomiting, difficulty breathing or other concerns.

## 2021-01-17 ENCOUNTER — Ambulatory Visit: Payer: Self-pay | Admitting: Internal Medicine

## 2021-01-17 NOTE — Progress Notes (Deleted)
Follow-up Outpatient Visit Date: 01/17/2021  Primary Care Provider: Patient, No Pcp Per (Inactive) No address on file  Chief Complaint: ***  HPI:  Benjamin Beltran is a 35 y.o. male with history of coronary artery disease with NSTEMI (7/22 involving distal RPDA too small for PCI), hypertension, abnormal LFTs, and tobacco/marijuana abuse, who presents for follow-up of coronary artery disease.  He was last seen in our office in late August by Benjamin Listen, PA, following his hospitalization a month earlier.  He continued to note random brief episodes of chest pain, which had improved after discontinuation of tobacco use and vaping.  He had run out of clopidogrel and also did not take atorvastatin or amlodipine due to side effect profiles.  He presented to the [facility] emergency department in early September complaining of a one day history of chest pain, which he attributed to feeling "stressed out" over right groin pain.  Cardiac work-up was reassuring.  He was advised to take NSAIDs for his hydrocele, varicocele, and epididymal cyst, with outpatient urologic consultation.  --------------------------------------------------------------------------------------------------  Past Medical History:  Diagnosis Date   Chest pain    a. 07/2020 Echo: EF 60-65%, no rwma, nl RV fxn. No significant valvular dzs.   Elevated liver function tests unk   Essential hypertension    Rhabdomyolysis    Tobacco abuse    Vapes nicotine containing substance    Past Surgical History:  Procedure Laterality Date   LEFT HEART CATH AND CORONARY ANGIOGRAPHY N/A 08/20/2020   Procedure: LEFT HEART CATH AND CORONARY ANGIOGRAPHY;  Surgeon: Iran Ouch, MD;  Location: [facility] INVASIVE CV LAB;  Service: Cardiovascular;  Laterality: N/A;   NECK SURGERY     tendon surgery to left hand  unk    No outpatient medications have been marked as taking for the 01/17/21 encounter (Appointment) with Benjamin Beltran, Benjamin Deer, MD.    Allergies:  Antihistamines, diphenhydramine-type and Tylenol [acetaminophen]  Social History   Tobacco Use   Smoking status: Former    Packs/day: 0.50    Types: Cigarettes   Smokeless tobacco: Never  Vaping Use   Vaping Use: Every day   Devices: 1 cartridge q 2 days  Substance Use Topics   Alcohol use: No   Drug use: Yes    Types: Marijuana    Comment: occassionally    Family History  Problem Relation Age of Onset   Diabetes Mother    Irregular heart beat Mother     Review of Systems: A 12-system review of systems was performed and was negative except as noted in the HPI.  --------------------------------------------------------------------------------------------------  Physical Exam: There were no vitals taken for this visit.  General:  NAD. Neck: No JVD or HJR. Lungs: Clear to auscultation bilaterally without wheezes or crackles. Heart: Regular rate and rhythm without murmurs, rubs, or gallops. Abdomen: Soft, nontender, nondistended. Extremities: No lower extremity edema.  EKG:  ***  Lab Results  Component Value Date   WBC 6.4 10/26/2020   HGB 13.1 10/26/2020   HCT 38.3 (L) 10/26/2020   MCV 82.9 10/26/2020   PLT 210 10/26/2020    Lab Results  Component Value Date   NA 136 10/26/2020   K 3.2 (L) 10/26/2020   CL 103 10/26/2020   CO2 26 10/26/2020   BUN 14 10/26/2020   CREATININE 1.21 10/26/2020   GLUCOSE 109 (H) 10/26/2020   ALT 67 (H) 08/21/2020    Lab Results  Component Value Date   CHOL 167 08/20/2020   HDL 40 (L) 08/20/2020  LDLCALC 118 (H) 08/20/2020   TRIG 46 08/20/2020   CHOLHDL 4.2 08/20/2020    --------------------------------------------------------------------------------------------------  ASSESSMENT AND PLAN: Benjamin Kendall, MD 01/17/2021 6:52 AM

## 2021-01-21 ENCOUNTER — Ambulatory Visit: Payer: Self-pay | Admitting: Internal Medicine

## 2021-01-21 NOTE — Progress Notes (Deleted)
Follow-up Outpatient Visit Date: 01/21/2021  Primary Care Provider: Patient, No Pcp Per (Inactive) No address on file  Chief Complaint: ***  HPI:  Benjamin Beltran is a 35 y.o. male with history of coronary artery disease with NSTEMI (7/22 involving distal RPDA too small for PCI), hypertension, abnormal LFTs, and tobacco/marijuana abuse, who presents for follow-up of coronary artery disease.  He was last seen in our office in late August by Benjamin Listen, PA, following his hospitalization a month earlier.  He continued to note random brief episodes of chest pain, which had improved after discontinuation of tobacco use and vaping.  He had run out of clopidogrel and also did not take atorvastatin or amlodipine due to side effect profiles.  He presented to the [facility] emergency department in early September complaining of a one day history of chest pain, which he attributed to feeling "stressed out" over right groin pain.  Cardiac work-up was reassuring.  He was advised to take NSAIDs for his hydrocele, varicocele, and epididymal cyst, with outpatient urologic consultation.  --------------------------------------------------------------------------------------------------  Past Medical History:  Diagnosis Date   Chest pain    a. 07/2020 Echo: EF 60-65%, no rwma, nl RV fxn. No significant valvular dzs.   Elevated liver function tests unk   Essential hypertension    Rhabdomyolysis    Tobacco abuse    Vapes nicotine containing substance    Past Surgical History:  Procedure Laterality Date   LEFT HEART CATH AND CORONARY ANGIOGRAPHY N/A 08/20/2020   Procedure: LEFT HEART CATH AND CORONARY ANGIOGRAPHY;  Surgeon: Iran Ouch, MD;  Location: [facility] INVASIVE CV LAB;  Service: Cardiovascular;  Laterality: N/A;   NECK SURGERY     tendon surgery to left hand  unk    No outpatient medications have been marked as taking for the 01/21/21 encounter (Appointment) with Benjamin Beltran, Benjamin Deer, MD.    Allergies:  Antihistamines, diphenhydramine-type and Tylenol [acetaminophen]  Social History   Tobacco Use   Smoking status: Former    Packs/day: 0.50    Types: Cigarettes   Smokeless tobacco: Never  Vaping Use   Vaping Use: Every day   Devices: 1 cartridge q 2 days  Substance Use Topics   Alcohol use: No   Drug use: Yes    Types: Marijuana    Comment: occassionally    Family History  Problem Relation Age of Onset   Diabetes Mother    Irregular heart beat Mother     Review of Systems: A 12-system review of systems was performed and was negative except as noted in the HPI.  --------------------------------------------------------------------------------------------------  Physical Exam: There were no vitals taken for this visit.  General:  NAD. Neck: No JVD or HJR. Lungs: Clear to auscultation bilaterally without wheezes or crackles. Heart: Regular rate and rhythm without murmurs, rubs, or gallops. Abdomen: Soft, nontender, nondistended. Extremities: No lower extremity edema.  EKG:  ***  Lab Results  Component Value Date   WBC 6.4 10/26/2020   HGB 13.1 10/26/2020   HCT 38.3 (L) 10/26/2020   MCV 82.9 10/26/2020   PLT 210 10/26/2020    Lab Results  Component Value Date   NA 136 10/26/2020   K 3.2 (L) 10/26/2020   CL 103 10/26/2020   CO2 26 10/26/2020   BUN 14 10/26/2020   CREATININE 1.21 10/26/2020   GLUCOSE 109 (H) 10/26/2020   ALT 67 (H) 08/21/2020    Lab Results  Component Value Date   CHOL 167 08/20/2020   HDL 40 (L) 08/20/2020  LDLCALC 118 (H) 08/20/2020   TRIG 46 08/20/2020   CHOLHDL 4.2 08/20/2020    --------------------------------------------------------------------------------------------------  ASSESSMENT AND PLAN: Yvonne Kendall, MD 01/21/2021 7:12 AM

## 2021-01-26 ENCOUNTER — Encounter: Payer: Self-pay | Admitting: Nurse Practitioner

## 2021-01-26 ENCOUNTER — Ambulatory Visit: Payer: Self-pay | Admitting: Nurse Practitioner

## 2021-01-26 NOTE — Progress Notes (Deleted)
Office Visit    Patient Name: Benjamin Beltran Date of Encounter: 01/26/2021  Primary Care Provider:  Patient, No Pcp Per (Inactive) Primary Cardiologist:  Yvonne Kendall, MD  Chief Complaint    35 year old male with a history of hypertension, tobacco/vape use, abnormal LFTs, and CAD status post non-STEMI in June 2022, who presents for follow-up of CAD.  Past Medical History    Past Medical History:  Diagnosis Date   CAD (coronary artery disease)    a. 07/2020 NSTEMI/Cath: LM nl, LAD nl, LCX nl, RCA nl, RPDA 100. EF 55-65% -->Med rx.   Elevated liver function tests    Essential hypertension    History of echocardiogram    a. 07/2020 Echo: EF 60-65%, no rwma, nl RV fxn. No significant valvular dzs.   Rhabdomyolysis    Tobacco abuse    Vapes nicotine containing substance    Past Surgical History:  Procedure Laterality Date   LEFT HEART CATH AND CORONARY ANGIOGRAPHY N/A 08/20/2020   Procedure: LEFT HEART CATH AND CORONARY ANGIOGRAPHY;  Surgeon: Iran Ouch, MD;  Location: [facility] INVASIVE CV LAB;  Service: Cardiovascular;  Laterality: N/A;   NECK SURGERY     tendon surgery to left hand  unk    Allergies  Allergies  Allergen Reactions   Antihistamines, Diphenhydramine-Type     liver   Tylenol [Acetaminophen]     Liver problems    History of Present Illness    35 year old male with the above past medical history including CAD, hypertension, tobacco/vape use, and abnormal LFTs.  He was initially evaluated in the outpatient setting for atypical, sharp/stabbing chest pain in June 2022, with echocardiogram showing normal LV function without regional wall motion abnormalities, and without any significant valvular disease.  He was subsequently scheduled for coronary CT angiography however, on June 30 he had sudden onset of substernal chest heaviness and pressure associated with dyspnea and lightheadedness.  He presented to the emergency department where high-sensitivity  troponin was elevated 115 with a subsequent rise to 2087.  He was admitted and underwent diagnostic catheterization revealing an occlusion of the RPDA- ?  localized plaque rupture versus vasospasm.  Medical therapy was recommended, including dual antiplatelet therapy x12 months.  He was last seen in cardiology clinic on August 25, at which time he was doing well.  Though he had been prescribed amlodipine and atorvastatin, he was not taking was not interested in a new prescription.  He had quit smoking.  He was seen back in the emergency department on September 7 with complaints of right groin pain.  Troponins were evaluated and were mildly elevated at 64 and 62.  For groin pain, he was placed on NSAIDs and referred to urology.  Home Medications    Current Outpatient Medications  Medication Sig Dispense Refill   amLODipine (NORVASC) 5 MG tablet Take 1 tablet (5 mg total) by mouth daily. (Patient not taking: Reported on 10/14/2020) 90 tablet 3   aspirin EC 81 MG tablet Take 1 tablet (81 mg total) by mouth daily. Swallow whole.     atorvastatin (LIPITOR) 40 MG tablet Take 1 tablet (40 mg total) by mouth daily. (Patient not taking: Reported on 10/14/2020) 90 tablet 3   buPROPion (WELLBUTRIN XL) 150 MG 24 hr tablet Take 150 mg by mouth daily.     clopidogrel (PLAVIX) 75 MG tablet Take 1 tablet (75 mg total) by mouth daily with breakfast. 90 tablet 3   naproxen (NAPROSYN) 500 MG tablet Take 1 tablet (  500 mg total) by mouth 2 (two) times daily with a meal. 14 tablet 0   nitroGLYCERIN (NITROSTAT) 0.4 MG SL tablet Place 1 tablet (0.4 mg total) under the tongue every 5 (five) minutes x 3 doses as needed for chest pain. 30 tablet 12   No current facility-administered medications for this visit.     Review of Systems    ***.  All other systems reviewed and are otherwise negative except as noted above.    Physical Exam    VS:  There were no vitals taken for this visit. , BMI There is no height or weight  on file to calculate BMI.     GEN: Well nourished, well developed, in no acute distress. HEENT: normal. Neck: Supple, no JVD, carotid bruits, or masses. Cardiac: RRR, no murmurs, rubs, or gallops. No clubbing, cyanosis, edema.  Radials/DP/PT 2+ and equal bilaterally.  Respiratory:  Respirations regular and unlabored, clear to auscultation bilaterally. GI: Soft, nontender, nondistended, BS + x 4. MS: no deformity or atrophy. Skin: warm and dry, no rash. Neuro:  Strength and sensation are intact. Psych: Normal affect.  Accessory Clinical Findings    ECG personally reviewed by me today - *** - no acute changes.  Lab Results  Component Value Date   WBC 6.4 10/26/2020   HGB 13.1 10/26/2020   HCT 38.3 (L) 10/26/2020   MCV 82.9 10/26/2020   PLT 210 10/26/2020   Lab Results  Component Value Date   CREATININE 1.21 10/26/2020   BUN 14 10/26/2020   NA 136 10/26/2020   K 3.2 (L) 10/26/2020   CL 103 10/26/2020   CO2 26 10/26/2020   Lab Results  Component Value Date   ALT 67 (H) 08/21/2020   AST 89 (H) 08/21/2020   ALKPHOS 37 (L) 08/21/2020   BILITOT 0.5 08/21/2020   Lab Results  Component Value Date   CHOL 167 08/20/2020   HDL 40 (L) 08/20/2020   LDLCALC 118 (H) 08/20/2020   TRIG 46 08/20/2020   CHOLHDL 4.2 08/20/2020    No results found for: HGBA1C  Assessment & Plan    1.  ***   Nicolasa Ducking, NP 01/26/2021, 2:05 PM

## 2021-02-27 NOTE — Progress Notes (Signed)
Cardiology Office Note:    Date:  02/28/2021   ID:  Benjamin Beltran, DOB 01-16-1986, MRN [id number]  PCP:  Patient, No Pcp Per (Inactive)  [facility] Cardiologist:  Nelva Bush, MD  [place] Electrophysiologist:  None   Referring MD: No ref. provider found   Chief Complaint: 3 month follow-up  History of Present Illness:    Benjamin Beltran is a 36 y.o. male with a hx of CAD with NSTEMI in 08/2020 medically managed, HTN, tobacco/vape use, occasional marijuana use, abnormal LFTs, chest pain who presents for 3 month follow-up.   He was evaluated in our office in 07/2020 with history of sharp and stabbing central chest pain that occurred at rest and improved with activity, including vigorous exercise.  It was noted he had previously been evaluated in the ED for similar symptoms with a mildly elevated high-sensitivity troponin with a flat trend of 40.  EKG at the time of his office visit showed mild ST elevation in leads I and aVL with inferior and anterolateral T wave version.  Echo was performed in the office which showed normal LV systolic function without regional wall motion abnormalities or significant valvular disease.  Recommendation was made to proceed with coronary CTA.  However, prior to completion of this study he presented to [facility] on 7/1 and was admitted through 7/2 with an NSTEMI.  High-sensitivity troponin peaked at 2087.  LHC showed no significant CAD noted in the major arteries with 100% stenosis of the rPDA that appeared to be distal and was the likely culprit for his NSTEMI.  This occlusion was suspected to be localized plaque rupture versus vasospasm.  Normal LV systolic function with mildly elevated LVEDP.  Medical therapy was recommended.  He was last seen 10/14/20 and was doing well from a cardiac perspective.  He does state today that he started to have some tightness in his chest 1 to 2 weeks ago.  This tightness happens randomly and is not associated with activity.   It most often happens when he is sitting down or driving.  He does have occasional shortness of breath this is also random and not associated with the chest tightness.  Last time that he had shortness of breath was when he was laying on his right side last night.  He states that running helps his breathing and he does not ever have chest pain while running.  I have a low suspicion that this is cardiac however, may be vasospasm.  He was prescribed Norvasc but is not taking it.  I explained to him that this will help with his vasospasm.  He also was prescribed Plavix which she is not taking.  I explained the importance of taking Plavix and baby aspirin for 1 year.  We also discussed smoking cessation.  He had been smoking Black and milds every day last week because it was a hard week for him.  He has since quit cold [place].  He does not smoke marijuana anymore.  He stopped taking his Lipitor due to some side effects.  He also had elevated LFTs at one-point.  We have started him on Zetia 10 mg daily today.  Hopefully this will help optimize his LDL which was above goal at 118, goal due to his CAD is less than 70.  Blood pressure is well controlled today in the clinic.  We discussed maintaining a heart healthy diet.  Currently, he is only eating 1 meal a day and I emphasized the importance of  eating 3 small meals a day that have good protein content and fiber.  Reports no dyspnea on exertion.  No edema, orthopnea, PND. Reports no palpitations.     Past Medical History:  Diagnosis Date   CAD (coronary artery disease)    a. 07/2020 NSTEMI/Cath: LM nl, LAD nl, LCX nl, RCA nl, RPDA 100. EF 55-65% -->Med rx.   Elevated liver function tests    Essential hypertension    History of echocardiogram    a. 07/2020 Echo: EF 60-65%, no rwma, nl RV fxn. No significant valvular dzs.   Rhabdomyolysis    Tobacco abuse    Vapes nicotine containing substance     Past Surgical History:  Procedure Laterality Date   LEFT  HEART CATH AND CORONARY ANGIOGRAPHY N/A 08/20/2020   Procedure: LEFT HEART CATH AND CORONARY ANGIOGRAPHY;  Surgeon: Wellington Hampshire, MD;  Location: [facility];  Service: Cardiovascular;  Laterality: N/A;   NECK SURGERY     tendon surgery to left hand  unk    Current Medications: Current Meds  Medication Sig   aspirin EC 81 MG tablet Take 1 tablet (81 mg total) by mouth daily. Swallow whole.   buPROPion (WELLBUTRIN XL) 150 MG 24 hr tablet Take 150 mg by mouth daily.   clopidogrel (PLAVIX) 75 MG tablet Take 1 tablet (75 mg total) by mouth daily with breakfast.   nitroGLYCERIN (NITROSTAT) 0.4 MG SL tablet Place 1 tablet (0.4 mg total) under the tongue every 5 (five) minutes x 3 doses as needed for chest pain.     Allergies:   Antihistamines, diphenhydramine-type and Tylenol [acetaminophen]   Social History   Socioeconomic History   Marital status: Married    Spouse name: Not on file   Number of children: Not on file   Years of education: Not on file   Highest education level: Not on file  Occupational History   Not on file  Tobacco Use   Smoking status: Former    Packs/day: 0.50    Types: Cigarettes   Smokeless tobacco: Never  Vaping Use   Vaping Use: Every day   Devices: 1 cartridge q 2 days  Substance and Sexual Activity   Alcohol use: No   Drug use: Yes    Types: Marijuana    Comment: occassionally   Sexual activity: Yes    Partners: Female  Other Topics Concern   Not on file  Social History Narrative   Lives locally w/ wife and children. Exercises regularly.   Social Determinants of Health   Financial Resource Strain: High Risk   Difficulty of Paying Living Expenses: Hard  Food Insecurity: Food Insecurity Present   Worried About Running Out of Food in the Last Year: Sometimes true   Ran Out of Food in the Last Year: Sometimes true  Transportation Needs: No Transportation Needs   Lack of Transportation (Medical): No   Lack of Transportation  (Non-Medical): No  Physical Activity: Not on file  Stress: Not on file  Social Connections: Not on file     Family History: The patient's family history includes Diabetes in his mother; Irregular heart beat in his mother.  ROS:   Please see the history of present illness.     All other systems reviewed and are negative.  EKGs/Labs/Other Studies Reviewed:    The following studies were reviewed today:  2D echo 07/2020: 1. Left ventricular ejection fraction, by estimation, is 60 to 65%. The  left ventricle has normal function. The  left ventricle has no regional  wall motion abnormalities. Left ventricular diastolic parameters were  normal. The average left ventricular  global longitudinal strain is -17.2 %. The global longitudinal strain is  normal.   2. Right ventricular systolic function is normal. The right ventricular  size is normal.   3. The mitral valve is normal in structure. No evidence of mitral valve  regurgitation. No evidence of mitral stenosis. __________   LHC 08/2020: RPDA lesion is 100% stenosed. The left ventricular systolic function is normal. LV end diastolic pressure is mildly elevated. The left ventricular ejection fraction is 55-65% by visual estimate.   1.  No significant coronary artery disease noted in the major arteries.  The right PDA seems to be occluded distally.  This is the likely culprit for non-STEMI. 2.  Normal LV systolic function mildly elevated left ventricular end-diastolic pressure.   Recommendations: The patient's coronary arteries are overall normal except the distal portion of the right PDA.  Suspect localized plaque rupture or vasospasm leading to occlusion.  Recommend medical therapy. Will place the patient on clopidogrel for a year. Continue treatment with a statin. I switched metoprolol to amlodipine. I recommended that he quit tobacco use. Possible discharge home tomorrow if he remains stable.  EKG:  EKG is not ordered today.    Recent Labs: 08/20/2020: Magnesium 2.1 08/21/2020: ALT 67 10/26/2020: BUN 14; Creatinine, Ser 1.21; Hemoglobin 13.1; Platelets 210; Potassium 3.2; Sodium 136  Recent Lipid Panel    Component Value Date/Time   CHOL 167 08/20/2020 0645   TRIG 46 08/20/2020 0645   HDL 40 (L) 08/20/2020 0645   CHOLHDL 4.2 08/20/2020 0645   VLDL 9 08/20/2020 0645   LDLCALC 118 (H) 08/20/2020 0645     Physical Exam:    VS:  BP 120/84 (BP Location: Left Arm, Patient Position: Sitting, Cuff Size: Large)    Pulse 60    Ht 5\' 11"  (1.803 m)    Wt 232 lb (105.2 kg)    SpO2 97%    BMI 32.36 kg/m     Wt Readings from Last 3 Encounters:  02/28/21 232 lb (105.2 kg)  10/26/20 243 lb (110.2 kg)  10/14/20 243 lb (110.2 kg)     GEN:  Well nourished, well developed in no acute distress HEENT: Normal NECK: No JVD; No carotid bruits LYMPHATICS: No lymphadenopathy CARDIAC: RRR, no murmurs, rubs, gallops RESPIRATORY:  Clear to auscultation without rales, wheezing or rhonchi  ABDOMEN: Soft, non-tender, non-distended MUSCULOSKELETAL:  No edema; No deformity  SKIN: Warm and dry NEUROLOGIC:  Alert and oriented x 3 PSYCHIATRIC:  Normal affect   ASSESSMENT:    1. Coronary artery disease involving native heart without angina pectoris, unspecified vessel or lesion type   2. Essential hypertension   3. Hyperlipidemia LDL goal <70   4. Abnormal LFTs   5. History of tobacco use   6. Marijuana use    PLAN:    In order of problems listed above:  CAD prior NSTEMI -Occasional chest tightness not associated with activity -Continue clopidogrel/ASA 81 mg for 12 months from the date of LHC -Continue Norvasc for vasospasm  2. HTN -Blood pressure well controlled today in the clinic -Continue to monitor blood pressure at home -Continue low-sodium diet, heart healthy diet  3. HLD -He was prescribed Lipitor 40 mg daily, currently not taking -Last lipid panel 08/2020 revealed total cholesterol 167, HDL 40, LDL 118, and  triglycerides 46 -Due to his history of CAD LDL goal less than  70 -Start Zetia 10mg  and repeat Lipid panel before next appointment -If remains elevated will consider f/u with the lipid clinic  4. H/o abnormal LFTs -AST/ALT slightly elevated in the past. -This may be why statin was discontinued  5. Tobacco/marijuana use -cessation advised -He stopped smoking "cold [place]"   Disposition: Follow up 6 months with Dr. Saunders Revel or APP    Signed, Elgie Collard, PA-C  02/28/2021 10:09 AM    [place]

## 2021-02-28 ENCOUNTER — Encounter: Payer: Self-pay | Admitting: Medical

## 2021-02-28 ENCOUNTER — Ambulatory Visit (INDEPENDENT_AMBULATORY_CARE_PROVIDER_SITE_OTHER): Payer: Self-pay | Admitting: Physician Assistant

## 2021-02-28 ENCOUNTER — Other Ambulatory Visit: Payer: Self-pay

## 2021-02-28 VITALS — BP 120/84 | HR 60 | Ht 71.0 in | Wt 232.0 lb

## 2021-02-28 DIAGNOSIS — I10 Essential (primary) hypertension: Secondary | ICD-10-CM

## 2021-02-28 DIAGNOSIS — F1290 Cannabis use, unspecified, uncomplicated: Secondary | ICD-10-CM

## 2021-02-28 DIAGNOSIS — E785 Hyperlipidemia, unspecified: Secondary | ICD-10-CM

## 2021-02-28 DIAGNOSIS — Z87891 Personal history of nicotine dependence: Secondary | ICD-10-CM

## 2021-02-28 DIAGNOSIS — R7989 Other specified abnormal findings of blood chemistry: Secondary | ICD-10-CM

## 2021-02-28 DIAGNOSIS — I2510 Atherosclerotic heart disease of native coronary artery without angina pectoris: Secondary | ICD-10-CM

## 2021-02-28 MED ORDER — AMLODIPINE BESYLATE 5 MG PO TABS: 5.0000 mg | ORAL_TABLET | Freq: Every day | ORAL | 3 refills | Status: DC

## 2021-02-28 MED ORDER — EZETIMIBE 10 MG PO TABS: 10.0000 mg | ORAL_TABLET | Freq: Every day | ORAL | 3 refills | Status: DC

## 2021-02-28 MED ORDER — CLOPIDOGREL BISULFATE 75 MG PO TABS: 75.0000 mg | ORAL_TABLET | Freq: Every day | ORAL | 3 refills | Status: DC

## 2021-02-28 NOTE — Patient Instructions (Addendum)
Medication Instructions:  Please START Zetia 10 mg daily  *If you need a refill on your cardiac medications before your next appointment, please call your pharmacy*  Lab Work: Lipid (6 months) Please call the office to arrange in July  If you have labs (blood work) drawn today and your tests are completely normal, you will receive your results only by: MyChart Message (if you have MyChart) OR A paper copy in the mail If you have any lab test that is abnormal or we need to change your treatment, we will call you to review the results.   Testing/Procedures: None at this time   Follow-Up: At [facility], you and your health needs are our priority.  As part of our continuing mission to provide you with exceptional heart care, we have created designated Provider Care Teams.  These Care Teams include your primary Cardiologist (physician) and Advanced Practice Providers (APPs -  Physician Assistants and Nurse Practitioners) who all work together to provide you with the care you need, when you need it.   Your next appointment:   9 month(s)  The format for your next appointment:   In Person  Provider:   You may see Yvonne Kendall, MD or one of the following Advanced Practice Providers on your designated Care Team:   Nicolasa Ducking, NP Eula Listen, PA-C Cadence Fransico Michael, [place]

## 2021-03-01 ENCOUNTER — Telehealth: Payer: Self-pay | Admitting: Family Medicine

## 2021-03-01 NOTE — Telephone Encounter (Signed)
Patient needs to speak with nurse concerning his prescription

## 2021-03-10 ENCOUNTER — Ambulatory Visit: Payer: Self-pay | Admitting: Nurse Practitioner

## 2021-05-05 ENCOUNTER — Emergency Department: Payer: Medicaid Other

## 2021-05-05 ENCOUNTER — Encounter: Payer: Self-pay | Admitting: Emergency Medicine

## 2021-05-05 ENCOUNTER — Other Ambulatory Visit: Payer: Self-pay

## 2021-05-05 ENCOUNTER — Emergency Department
Admission: EM | Admit: 2021-05-05 | Discharge: 2021-05-05 | Disposition: A | Discharge status: Home / Self Care | Payer: Medicaid Other | Attending: Emergency Medicine | Admitting: Emergency Medicine

## 2021-05-05 DIAGNOSIS — R7401 Elevation of levels of liver transaminase levels: Secondary | ICD-10-CM | POA: Insufficient documentation

## 2021-05-05 DIAGNOSIS — I10 Essential (primary) hypertension: Secondary | ICD-10-CM | POA: Insufficient documentation

## 2021-05-05 DIAGNOSIS — R778 Other specified abnormalities of plasma proteins: Secondary | ICD-10-CM | POA: Insufficient documentation

## 2021-05-05 DIAGNOSIS — R519 Headache, unspecified: Secondary | ICD-10-CM | POA: Insufficient documentation

## 2021-05-05 DIAGNOSIS — R1030 Lower abdominal pain, unspecified: Secondary | ICD-10-CM | POA: Insufficient documentation

## 2021-05-05 DIAGNOSIS — I2510 Atherosclerotic heart disease of native coronary artery without angina pectoris: Secondary | ICD-10-CM | POA: Insufficient documentation

## 2021-05-05 DIAGNOSIS — R0789 Other chest pain: Secondary | ICD-10-CM | POA: Insufficient documentation

## 2021-05-05 DIAGNOSIS — F17200 Nicotine dependence, unspecified, uncomplicated: Secondary | ICD-10-CM | POA: Insufficient documentation

## 2021-05-05 LAB — CBC WITH DIFFERENTIAL/PLATELET
Abs Immature Granulocytes: 0.01 10*3/uL (ref 0.00–0.07)
Basophils Absolute: 0 10*3/uL (ref 0.0–0.1)
Basophils Relative: 0 %
Eosinophils Absolute: 0.2 10*3/uL (ref 0.0–0.5)
Eosinophils Relative: 4 %
HCT: 42.1 % (ref 39.0–52.0)
Hemoglobin: 13.4 g/dL (ref 13.0–17.0)
Immature Granulocytes: 0 %
Lymphocytes Relative: 45 %
Lymphs Abs: 2 10*3/uL (ref 0.7–4.0)
MCH: 27.3 pg (ref 26.0–34.0)
MCHC: 31.8 g/dL (ref 30.0–36.0)
MCV: 85.9 fL (ref 80.0–100.0)
Monocytes Absolute: 0.4 10*3/uL (ref 0.1–1.0)
Monocytes Relative: 9 %
Neutro Abs: 1.9 10*3/uL (ref 1.7–7.7)
Neutrophils Relative %: 42 %
Platelets: 206 10*3/uL (ref 150–400)
RBC: 4.9 MIL/uL (ref 4.22–5.81)
RDW: 12.7 % (ref 11.5–15.5)
WBC: 4.5 10*3/uL (ref 4.0–10.5)
nRBC: 0 % (ref 0.0–0.2)

## 2021-05-05 LAB — COMPREHENSIVE METABOLIC PANEL
ALT: 77 U/L — ABNORMAL HIGH (ref 0–44)
AST: 90 U/L — ABNORMAL HIGH (ref 15–41)
Albumin: 3.7 g/dL (ref 3.5–5.0)
Alkaline Phosphatase: 51 U/L (ref 38–126)
Anion gap: 6 (ref 5–15)
BUN: 15 mg/dL (ref 6–20)
CO2: 27 mmol/L (ref 22–32)
Calcium: 8.9 mg/dL (ref 8.9–10.3)
Chloride: 105 mmol/L (ref 98–111)
Creatinine, Ser: 1.21 mg/dL (ref 0.61–1.24)
GFR, Estimated: >60 mL/min (ref >60)
Glucose, Bld: 92 mg/dL (ref 70–99)
Potassium: 3.6 mmol/L (ref 3.5–5.1)
Sodium: 138 mmol/L (ref 135–145)
Total Bilirubin: 0.2 mg/dL — ABNORMAL LOW (ref 0.3–1.2)
Total Protein: 7.1 g/dL (ref 6.5–8.1)

## 2021-05-05 LAB — URINALYSIS, ROUTINE W REFLEX MICROSCOPIC
Bilirubin Urine: NEGATIVE
Glucose, UA: NEGATIVE mg/dL
Hgb urine dipstick: NEGATIVE
Ketones, ur: NEGATIVE mg/dL
Leukocytes,Ua: NEGATIVE
Nitrite: NEGATIVE
Protein, ur: NEGATIVE mg/dL
Specific Gravity, Urine: 1.01 (ref 1.005–1.030)
pH: 6 (ref 5.0–8.0)

## 2021-05-05 LAB — TROPONIN I (HIGH SENSITIVITY)
Troponin I (High Sensitivity): 71 ng/L — ABNORMAL HIGH (ref <18)
Troponin I (High Sensitivity): 69 ng/L — ABNORMAL HIGH (ref <18)

## 2021-05-05 MED ORDER — LACTATED RINGERS IV BOLUS
1000.0000 mL | Freq: Once | INTRAVENOUS | Status: AC
  Administered 2021-05-05: 1000 mL via INTRAVENOUS

## 2021-05-05 MED ORDER — KETOROLAC TROMETHAMINE 30 MG/ML IJ SOLN
15.0000 mg | Freq: Once | INTRAMUSCULAR | Status: AC
  Administered 2021-05-05: 15 mg via INTRAVENOUS
  Filled 2021-05-05: qty 1

## 2021-05-05 NOTE — ED Provider Notes (Signed)
? ?[facility] ?Provider Note ? ? ? Event Date/Time  ? First MD Initiated Contact with Patient 05/05/21 0321   ?  (approximate) ? ? ?History  ? ?Chest Pain ? ? ?HPI ? ?Benjamin Beltran is a 36 y.o. male who presents to the ED for evaluation of Chest Pain ?  ?Review outpatient cardiology visit from 1/9.  History of CAD with an NSTEMI in July of last year.  Managed medically.  HTN, tobacco abuse. ? ?Pt presents to the ED for evaluation of acute on chronic intermittent chest pain. He reports pain lasting a matter of minutes, a couple times per day, occurring nearly every day. Denies SOB or DOE, denies syncope, fever or cough ? ?Reports a "rotten tooth" and fractured teeth to left maxilla, reports headache radiating superiorly from his maxilla.  ? ?Reports suprapubic discomfort without vomiting, diarrhea, dysuria or penile discharge.  ? ? ?Physical Exam  ? ?Triage Vital Signs: ?ED Triage Vitals  ?Enc Vitals Group  ?   BP 05/05/21 0311 106/82  ?   Pulse Rate 05/05/21 0311 61  ?   Resp 05/05/21 0311 16  ?   Temp 05/05/21 0311 98.6 ?F (37 ?C)  ?   Temp Source 05/05/21 0311 Oral  ?   SpO2 05/05/21 0311 98 %  ?   Weight 05/05/21 0312 230 lb (104.3 kg)  ?   Height 05/05/21 0312 5\' 9"  (1.753 m)  ?   Head Circumference --   ?   Peak Flow --   ?   Pain Score 05/05/21 0312 8  ?   Pain Loc --   ?   Pain Edu? --   ?   Excl. in GC? --   ? ? ?Most recent vital signs: ?Vitals:  ? 05/05/21 0500 05/05/21 0600  ?BP: 117/76 117/61  ?Pulse: (!) 49 (!) 56  ?Resp: 16 18  ?Temp:    ?SpO2: 92% 97%  ? ? ?General: Awake, no distress.  Pleasant and conversational ?CV:  Good peripheral perfusion.  RRR ?Resp:  Normal effort.  CTAB ?Abd:  No distention.  Soft and benign throughout ?MSK:  No deformity noted.  No swelling or deformity ?Neuro:  No focal deficits appreciated. Cranial nerves II through XII intact ?5/5 strength and sensation in all 4 extremities ?Other:   ? ? ?ED Results / Procedures / Treatments  ? ?Labs ?(all labs  ordered are listed, but only abnormal results are displayed) ?Labs Reviewed  ?COMPREHENSIVE METABOLIC PANEL - Abnormal; Notable for the following components:  ?    Result Value  ? AST 90 (*)   ? ALT 77 (*)   ? Total Bilirubin 0.2 (*)   ? All other components within normal limits  ?URINALYSIS, ROUTINE W REFLEX MICROSCOPIC - Abnormal; Notable for the following components:  ? Color, Urine STRAW (*)   ? APPearance CLEAR (*)   ? All other components within normal limits  ?TROPONIN I (HIGH SENSITIVITY) - Abnormal; Notable for the following components:  ? Troponin I (High Sensitivity) 71 (*)   ? All other components within normal limits  ?TROPONIN I (HIGH SENSITIVITY) - Abnormal; Notable for the following components:  ? Troponin I (High Sensitivity) 69 (*)   ? All other components within normal limits  ?CBC WITH DIFFERENTIAL/PLATELET  ? ? ?EKG ?Sinus rhythm, rate of 68 bpm.  Normal axis and intervals.  T wave inversions to inferior and lateral leads. ?Appears similar to EKG from September ? ?RADIOLOGY ?CXR reviewed by  me without evidence of acute cardiopulmonary pathology. ? ?Official radiology report(s): ?DG Chest 2 View ? ?Result Date: 05/05/2021 ?CLINICAL DATA:  Chest pain and left arm pain EXAM: CHEST - 2 VIEW COMPARISON:  08/19/2020 FINDINGS: Normal heart size and pulmonary vascularity. No focal airspace disease or consolidation in the lungs. No blunting of costophrenic angles. No pneumothorax. Mediastinal contours appear intact. Postoperative changes in the cervical spine. IMPRESSION: No active cardiopulmonary disease. Electronically Signed   By: Burman Nieves M.D.   On: 05/05/2021 03:45   ? ?PROCEDURES and INTERVENTIONS: ? ?.1-3 Lead EKG Interpretation ?Performed by: Delton Prairie, MD ?Authorized by: Delton Prairie, MD  ? ?  Interpretation: normal   ?  ECG rate:  61 ?  ECG rate assessment: normal   ?  Rhythm: sinus rhythm   ?  Ectopy: none   ?  Conduction: normal   ? ?Medications  ?lactated ringers bolus 1,000 mL (0  mLs Intravenous Stopped 05/05/21 0556)  ?ketorolac (TORADOL) 30 MG/ML injection 15 mg (15 mg Intravenous Given 05/05/21 0503)  ? ? ? ?IMPRESSION / MDM / ASSESSMENT AND PLAN / ED COURSE  ?I reviewed the triage vital signs and the nursing notes. ? ?Pleasant 36 year old male presents to the ED with atypical chest pains, without evidence of acute pathology and ultimately suitable for outpatient management.  He looks systemically well and has a normal examination.  Work-up is fairly benign.  EKG with chronic T wave inversions without acute features.  Troponin is chronically elevated and 2 high-sensitivity levels are flat and somewhat downtrending.  No signs of NSTEMI or ACS.  CBC and metabolic panel are normal, chronically elevated LFTs without evidence of biliary derangements acutely.  Urine without infectious features and CXR is clear.  I considered medical admission for this patient considering his known CAD and chest pain, but considering his benign work-up and desire for outpatient management, I believe close follow-up with cardiology is a possibility.  We discussed return precautions. ? ?Clinical Course as of 05/05/21 0648  ?Thu May 05, 2021  ?0550 Reassessed. Pt reports feeling much better. Resolution of all symptoms. I recommend 2nd trop and he's agreeable [DS]  ?0648 Reassessed again.  Continues to feel well.  Second troponin is downtrending.  We discussed disposition and agree upon outpatient management with close follow-up with cardiology.  Discussed return precautions. [DS]  ?  ?Clinical Course User Index ?[DS] Delton Prairie, MD  ? ? ? ?FINAL CLINICAL IMPRESSION(S) / ED DIAGNOSES  ? ?Final diagnoses:  ?Other chest pain  ? ? ? ?Rx / DC Orders  ? ?ED Discharge Orders   ? ? None  ? ?  ? ? ? ?Note:  This document was prepared using Dragon voice recognition software and may include unintentional dictation errors. ?  ?Delton Prairie, MD ?05/05/21 [phone] ? ?

## 2021-05-05 NOTE — ED Triage Notes (Signed)
Pt to ED from home c/o left chest pain for several days radiating to left arm and numbness to left hand intermittently.  SOB, dizziness, nausea.  Also states left headache with dental pain and right side pain.  Pt states hx of MI in the past, not on blood thinners.  Chest rise even and unlabored, skin WNL and in NAD at this time. ?

## 2021-05-12 ENCOUNTER — Observation Stay
Admission: EM | Admit: 2021-05-12 | Discharge: 2021-05-13 | Discharge status: Against Medical Advice | Payer: Medicaid Other | Attending: Internal Medicine | Admitting: Internal Medicine

## 2021-05-12 ENCOUNTER — Other Ambulatory Visit: Payer: Self-pay

## 2021-05-12 DIAGNOSIS — Z7902 Long term (current) use of antithrombotics/antiplatelets: Secondary | ICD-10-CM | POA: Insufficient documentation

## 2021-05-12 DIAGNOSIS — Z7982 Long term (current) use of aspirin: Secondary | ICD-10-CM | POA: Insufficient documentation

## 2021-05-12 DIAGNOSIS — M6282 Rhabdomyolysis: Principal | ICD-10-CM | POA: Diagnosis present

## 2021-05-12 DIAGNOSIS — I2510 Atherosclerotic heart disease of native coronary artery without angina pectoris: Secondary | ICD-10-CM | POA: Diagnosis present

## 2021-05-12 DIAGNOSIS — Z79899 Other long term (current) drug therapy: Secondary | ICD-10-CM | POA: Insufficient documentation

## 2021-05-12 DIAGNOSIS — R7989 Other specified abnormal findings of blood chemistry: Secondary | ICD-10-CM | POA: Diagnosis present

## 2021-05-12 DIAGNOSIS — E782 Mixed hyperlipidemia: Secondary | ICD-10-CM | POA: Diagnosis present

## 2021-05-12 DIAGNOSIS — I10 Essential (primary) hypertension: Secondary | ICD-10-CM | POA: Diagnosis present

## 2021-05-12 DIAGNOSIS — R778 Other specified abnormalities of plasma proteins: Secondary | ICD-10-CM | POA: Insufficient documentation

## 2021-05-12 DIAGNOSIS — Z87891 Personal history of nicotine dependence: Secondary | ICD-10-CM | POA: Insufficient documentation

## 2021-05-12 DIAGNOSIS — Z20822 Contact with and (suspected) exposure to covid-19: Secondary | ICD-10-CM | POA: Insufficient documentation

## 2021-05-12 LAB — BASIC METABOLIC PANEL
Anion gap: 7 (ref 5–15)
BUN: 18 mg/dL (ref 6–20)
CO2: 29 mmol/L (ref 22–32)
Calcium: 9.1 mg/dL (ref 8.9–10.3)
Chloride: 101 mmol/L (ref 98–111)
Creatinine, Ser: 1.44 mg/dL — ABNORMAL HIGH (ref 0.61–1.24)
GFR, Estimated: >60 mL/min (ref >60)
Glucose, Bld: 117 mg/dL — ABNORMAL HIGH (ref 70–99)
Potassium: 3.9 mmol/L (ref 3.5–5.1)
Sodium: 137 mmol/L (ref 135–145)

## 2021-05-12 LAB — CBC
HCT: 42.9 % (ref 39.0–52.0)
Hemoglobin: 13.9 g/dL (ref 13.0–17.0)
MCH: 27.6 pg (ref 26.0–34.0)
MCHC: 32.4 g/dL (ref 30.0–36.0)
MCV: 85.1 fL (ref 80.0–100.0)
Platelets: 228 10*3/uL (ref 150–400)
RBC: 5.04 MIL/uL (ref 4.22–5.81)
RDW: 12.6 % (ref 11.5–15.5)
WBC: 8.2 10*3/uL (ref 4.0–10.5)
nRBC: 0 % (ref 0.0–0.2)

## 2021-05-12 LAB — CK: Total CK: 4997 U/L — ABNORMAL HIGH (ref 49–397)

## 2021-05-12 NOTE — ED Triage Notes (Signed)
Pt presents to ER c/o HA, body aches and feeling dehydrated after running this afternoon.  Pt also reports some nausea.  Pt states he is worried about his CK levels being high as he has been in rhabdo before.  Pt is A&O x4 at this time and in NAD.   ?

## 2021-05-13 ENCOUNTER — Encounter: Payer: Self-pay | Admitting: Internal Medicine

## 2021-05-13 DIAGNOSIS — R778 Other specified abnormalities of plasma proteins: Secondary | ICD-10-CM

## 2021-05-13 DIAGNOSIS — R7989 Other specified abnormal findings of blood chemistry: Secondary | ICD-10-CM | POA: Diagnosis present

## 2021-05-13 DIAGNOSIS — I2510 Atherosclerotic heart disease of native coronary artery without angina pectoris: Secondary | ICD-10-CM | POA: Diagnosis present

## 2021-05-13 DIAGNOSIS — I25119 Atherosclerotic heart disease of native coronary artery with unspecified angina pectoris: Secondary | ICD-10-CM

## 2021-05-13 DIAGNOSIS — M6282 Rhabdomyolysis: Principal | ICD-10-CM

## 2021-05-13 DIAGNOSIS — E782 Mixed hyperlipidemia: Secondary | ICD-10-CM

## 2021-05-13 DIAGNOSIS — I10 Essential (primary) hypertension: Secondary | ICD-10-CM

## 2021-05-13 LAB — COMPREHENSIVE METABOLIC PANEL
ALT: 86 U/L — ABNORMAL HIGH (ref 0–44)
AST: 118 U/L — ABNORMAL HIGH (ref 15–41)
Albumin: 3.6 g/dL (ref 3.5–5.0)
Alkaline Phosphatase: 53 U/L (ref 38–126)
Anion gap: 4 — ABNORMAL LOW (ref 5–15)
BUN: 16 mg/dL (ref 6–20)
CO2: 30 mmol/L (ref 22–32)
Calcium: 8.8 mg/dL — ABNORMAL LOW (ref 8.9–10.3)
Chloride: 106 mmol/L (ref 98–111)
Creatinine, Ser: 1.29 mg/dL — ABNORMAL HIGH (ref 0.61–1.24)
GFR, Estimated: >60 mL/min (ref >60)
Glucose, Bld: 89 mg/dL (ref 70–99)
Potassium: 3.8 mmol/L (ref 3.5–5.1)
Sodium: 140 mmol/L (ref 135–145)
Total Bilirubin: 0.7 mg/dL (ref 0.3–1.2)
Total Protein: 6.7 g/dL (ref 6.5–8.1)

## 2021-05-13 LAB — CK
Total CK: 4079 U/L — ABNORMAL HIGH (ref 49–397)
Total CK: 4706 U/L — ABNORMAL HIGH (ref 49–397)

## 2021-05-13 LAB — CBC WITH DIFFERENTIAL/PLATELET
Abs Immature Granulocytes: 0.01 10*3/uL (ref 0.00–0.07)
Basophils Absolute: 0 10*3/uL (ref 0.0–0.1)
Basophils Relative: 0 %
Eosinophils Absolute: 0.2 10*3/uL (ref 0.0–0.5)
Eosinophils Relative: 3 %
HCT: 41.1 % (ref 39.0–52.0)
Hemoglobin: 13.2 g/dL (ref 13.0–17.0)
Immature Granulocytes: 0 %
Lymphocytes Relative: 43 %
Lymphs Abs: 2.9 10*3/uL (ref 0.7–4.0)
MCH: 27.8 pg (ref 26.0–34.0)
MCHC: 32.1 g/dL (ref 30.0–36.0)
MCV: 86.5 fL (ref 80.0–100.0)
Monocytes Absolute: 0.4 10*3/uL (ref 0.1–1.0)
Monocytes Relative: 6 %
Neutro Abs: 3.2 10*3/uL (ref 1.7–7.7)
Neutrophils Relative %: 48 %
Platelets: 200 10*3/uL (ref 150–400)
RBC: 4.75 MIL/uL (ref 4.22–5.81)
RDW: 12.6 % (ref 11.5–15.5)
WBC: 6.7 10*3/uL (ref 4.0–10.5)
nRBC: 0 % (ref 0.0–0.2)

## 2021-05-13 LAB — BASIC METABOLIC PANEL
Anion gap: 15 (ref 5–15)
BUN: 19 mg/dL (ref 6–20)
CO2: 23 mmol/L (ref 22–32)
Calcium: 9.2 mg/dL (ref 8.9–10.3)
Chloride: 99 mmol/L (ref 98–111)
Creatinine, Ser: 1.54 mg/dL — ABNORMAL HIGH (ref 0.61–1.24)
GFR, Estimated: 60 mL/min — ABNORMAL LOW (ref >60)
Glucose, Bld: 111 mg/dL — ABNORMAL HIGH (ref 70–99)
Potassium: 4 mmol/L (ref 3.5–5.1)
Sodium: 137 mmol/L (ref 135–145)

## 2021-05-13 LAB — URINE DRUG SCREEN, QUALITATIVE (ARMC ONLY)
Amphetamines, Ur Screen: NOT DETECTED
Barbiturates, Ur Screen: NOT DETECTED
Benzodiazepine, Ur Scrn: NOT DETECTED
Cannabinoid 50 Ng, Ur ~~LOC~~: POSITIVE — AB
Cocaine Metabolite,Ur ~~LOC~~: NOT DETECTED
MDMA (Ecstasy)Ur Screen: NOT DETECTED
Methadone Scn, Ur: NOT DETECTED
Opiate, Ur Screen: NOT DETECTED
Phencyclidine (PCP) Ur S: NOT DETECTED
Tricyclic, Ur Screen: NOT DETECTED

## 2021-05-13 LAB — URINALYSIS, ROUTINE W REFLEX MICROSCOPIC
Bilirubin Urine: NEGATIVE
Glucose, UA: NEGATIVE mg/dL
Hgb urine dipstick: NEGATIVE
Ketones, ur: NEGATIVE mg/dL
Leukocytes,Ua: NEGATIVE
Nitrite: NEGATIVE
Protein, ur: NEGATIVE mg/dL
Specific Gravity, Urine: 1.009 (ref 1.005–1.030)
pH: 6 (ref 5.0–8.0)

## 2021-05-13 LAB — TROPONIN I (HIGH SENSITIVITY)
Troponin I (High Sensitivity): 37 ng/L — ABNORMAL HIGH (ref <18)
Troponin I (High Sensitivity): 40 ng/L — ABNORMAL HIGH (ref <18)
Troponin I (High Sensitivity): 41 ng/L — ABNORMAL HIGH (ref <18)
Troponin I (High Sensitivity): 30 ng/L — ABNORMAL HIGH (ref <18)

## 2021-05-13 LAB — MAGNESIUM: Magnesium: 2.1 mg/dL (ref 1.7–2.4)

## 2021-05-13 MED ORDER — ASPIRIN EC 81 MG PO TBEC
81.0000 mg | DELAYED_RELEASE_TABLET | Freq: Every day | ORAL | Status: DC
Start: 2021-05-13 — End: 2021-05-13
  Filled 2021-05-13: qty 1

## 2021-05-13 MED ORDER — NITROGLYCERIN 0.4 MG SL SUBL: 0.4000 mg | SUBLINGUAL_TABLET | SUBLINGUAL | Status: DC | PRN

## 2021-05-13 MED ORDER — ONDANSETRON HCL 4 MG/2ML IJ SOLN: 4.0000 mg | Freq: Four times a day (QID) | INTRAMUSCULAR | Status: DC | PRN

## 2021-05-13 MED ORDER — CLOPIDOGREL BISULFATE 75 MG PO TABS
75.0000 mg | ORAL_TABLET | Freq: Every day | ORAL | Status: DC
  Filled 2021-05-13: qty 1

## 2021-05-13 MED ORDER — ATORVASTATIN CALCIUM 20 MG PO TABS: 40.0000 mg | ORAL_TABLET | Freq: Every day | ORAL | Status: DC

## 2021-05-13 MED ORDER — LACTATED RINGERS IV BOLUS
1000.0000 mL | Freq: Once | INTRAVENOUS | Status: AC
  Administered 2021-05-13: 1000 mL via INTRAVENOUS

## 2021-05-13 MED ORDER — LACTATED RINGERS IV BOLUS
1000.0000 mL | Freq: Once | INTRAVENOUS | Status: AC
Start: 2021-05-13 — End: 2021-05-13
  Administered 2021-05-13: 1000 mL via INTRAVENOUS

## 2021-05-13 MED ORDER — ONDANSETRON HCL 4 MG PO TABS: 4.0000 mg | ORAL_TABLET | Freq: Four times a day (QID) | ORAL | Status: DC | PRN

## 2021-05-13 MED ORDER — SODIUM CHLORIDE 0.9 % IV SOLN: INTRAVENOUS | Status: DC

## 2021-05-13 MED ORDER — HYDRALAZINE HCL 20 MG/ML IJ SOLN: 10.0000 mg | Freq: Four times a day (QID) | INTRAMUSCULAR | Status: DC | PRN

## 2021-05-13 MED ORDER — EZETIMIBE 10 MG PO TABS: 10.0000 mg | ORAL_TABLET | Freq: Every day | ORAL | Status: DC

## 2021-05-13 MED ORDER — POLYETHYLENE GLYCOL 3350 17 G PO PACK: 17.0000 g | PACK | Freq: Every day | ORAL | Status: DC | PRN

## 2021-05-13 MED ORDER — ENOXAPARIN SODIUM 60 MG/0.6ML IJ SOSY
0.5000 mg/kg | PREFILLED_SYRINGE | INTRAMUSCULAR | Status: DC
  Filled 2021-05-13: qty 0.6

## 2021-05-13 NOTE — H&P (Signed)
?History and Physical  ? ? ?Patient: Benjamin SpireJeremy D Beltran MRN: [id number] DOA: 05/12/2021 ? ?Date of Service: the patient was seen and examined on 05/13/2021 ? ?Patient coming from: Home ? ?Chief Complaint:  ?Chief Complaint  ?Patient presents with  ? Headache  ? Generalized Body Aches  ? ? ?HPI:  ? ?36 year old male with past medical history of coronary artery disease (status post NSTEMI 08/2020 with cath revealing 100% occlusion of the PDA managed medically), hypertension, marijuana use, nicotine dependence (Vape) presenting to the emergency department with complaints of headache and generalized body aches. ? ?Patient explains that earlier in the day on 3/23 he decided to go for a jog.  Shortly after going on his run the patient began to feel a rather sudden onset of generalized malaise and weakness.  This became associated with generalized severe body aches, moderate to severe in intensity, sore in quality, worse with movement and improved with rest.  Patient symptoms continue to persist for several hours.  Patient, who has been in rhabdomyolysis in the past decided [facility]. ? ?Upon evaluation in the emergency department chemistry revealed creatinine of 1.44.  Creatine kinase was found to be 4997.  Troponin was found to be slightly elevated at 37.  Patient was felt to clinically be suffering from early rhabdomyolysis.  Patient was initiated on intravenous fluids and the hospitalist group was then called to assess the patient for admission to the hospital. ? ?Review of Systems: Review of Systems  ?Constitutional:  Positive for malaise/fatigue.  ?Musculoskeletal:  Positive for myalgias.  ?Neurological:  Positive for weakness.  ? ? ?Past Medical History:  ?Diagnosis Date  ? CAD (coronary artery disease)   ? a. 07/2020 NSTEMI/Cath: LM nl, LAD nl, LCX nl, RCA nl, RPDA 100. EF 55-65% -->Med rx.  ? Elevated liver function tests   ? Essential hypertension   ? History of  echocardiogram   ? a. 07/2020 Echo: EF 60-65%, no rwma, nl RV fxn. No significant valvular dzs.  ? Rhabdomyolysis   ? Tobacco abuse   ? Vapes nicotine containing substance   ? ? ?Past Surgical History:  ?Procedure Laterality Date  ? LEFT HEART CATH AND CORONARY ANGIOGRAPHY N/A 08/20/2020  ? Procedure: LEFT HEART CATH AND CORONARY ANGIOGRAPHY;  Surgeon: Iran OuchArida, Muhammad A, MD;  Location: [facility] INVASIVE CV LAB;  Service: Cardiovascular;  Laterality: N/A;  ? NECK SURGERY    ? tendon surgery to left hand  unk  ? ? ?Social History:  reports that he has quit smoking. His smoking use included cigarettes. He smoked an average of .5 packs per day. He has never used smokeless tobacco. He reports current drug use. Drug: Marijuana. He reports that he does not drink alcohol. ? ?Allergies  ?Allergen Reactions  ? Antihistamines, Diphenhydramine-Type   ?  liver  ? Tylenol [Acetaminophen]   ?  Liver problems  ? ? ?Family History  ?Problem Relation Age of Onset  ? Diabetes Mother   ? Irregular heart beat Mother   ? ? ?Prior to Admission medications   ?Medication Sig Start Date End Date Taking? Authorizing Provider  ?amLODipine (NORVASC) 5 MG tablet Take 1 tablet (5 mg total) by mouth daily. 02/28/21   End, Cristal Deerhristopher, MD  ?aspirin EC 81 MG tablet Take 1 tablet (81 mg total) by mouth daily. Swallow whole. 07/21/20   End, Cristal Deerhristopher, MD  ?atorvastatin (LIPITOR) 40 MG tablet Take 1 tablet (40 mg total) by mouth daily. ?Patient not taking: Reported  on 10/14/2020 08/22/20   Enedina Finner, MD  ?buPROPion (WELLBUTRIN XL) 150 MG 24 hr tablet Take 150 mg by mouth daily. 10/05/20   [provider]  ?clopidogrel (PLAVIX) 75 MG tablet Take 1 tablet (75 mg total) by mouth daily with breakfast. 02/28/21   End, Cristal Deer, MD  ?ezetimibe (ZETIA) 10 MG tablet Take 1 tablet (10 mg total) by mouth daily. 02/28/21 05/29/21  End, Cristal Deer, MD  ?naproxen (NAPROSYN) 500 MG tablet Take 1 tablet (500 mg total) by mouth 2 (two) times daily with a meal. ?Patient  not taking: Reported on 02/28/2021 10/27/20   Irean Hong, MD  ?nitroGLYCERIN (NITROSTAT) 0.4 MG SL tablet Place 1 tablet (0.4 mg total) under the tongue every 5 (five) minutes x 3 doses as needed for chest pain. 08/21/20   Enedina Finner, MD  ? ? ?Physical Exam: ? ?Vitals:  ? 05/12/21 2221 05/13/21 0015 05/13/21 0145 05/13/21 0236  ?BP: 115/79 122/77 125/66 (!) 103/56  ?Pulse: 73 77 62 (!) 55  ?Resp: 17 18 16 16   ?Temp: 98.6 ?F (37 ?C)  98.5 ?F (36.9 ?C) 97.7 ?F (36.5 ?C)  ?TempSrc: Oral  Oral Oral  ?SpO2: 97% 98% 95% 99%  ?Weight: 104.8 kg     ?Height: 5\' 9"  (1.753 m)     ? ? ?Constitutional: Awake alert and oriented x3, no associated distress.   ?Skin: no rashes, no lesions, good skin turgor noted. ?Eyes: Pupils are equally reactive to light.  No evidence of scleral icterus or conjunctival pallor.  ?ENMT: Moist mucous membranes noted.  Posterior pharynx clear of any exudate or lesions.   ?Neck: normal, supple, no masses, no thyromegaly.  No evidence of jugular venous distension.   ?Respiratory: clear to auscultation bilaterally, no wheezing, no crackles. Normal respiratory effort. No accessory muscle use.  ?Cardiovascular: Regular rate and rhythm, no murmurs / rubs / gallops. No extremity edema. 2+ pedal pulses. No carotid bruits.  ?Chest:   Nontender without crepitus or deformity.   ?Back:   Nontender without crepitus or deformity. ?Abdomen: Abdomen is soft and nontender.  No evidence of intra-abdominal masses.  Positive bowel sounds noted in all quadrants.   ?Musculoskeletal: Mild tenderness of the extremities.  No associated deformity.  Good range of motion noted in all major joints.  ?Neurologic: CN 2-12 grossly intact. Sensation intact.  Patient moving all 4 extremities spontaneously.  Patient is following all commands.  Patient is responsive to verbal stimuli.   ?Psychiatric: Patient exhibits normal mood with appropriate affect.  Patient seems to possess insight as to their current situation.   ? ?Data  Reviewed: ? ?I have personally reviewed and interpreted labs, imaging. ? ?Significant findings are creatinine kinase 4997.  Troponin found to be slightly elevated at 37. ? ?EKG: Personally reviewed.  Rhythm is sinus bradycardia with heart rate of 57 bpm.  Notable T wave inversions in the lateral and inferior leads.  No dynamic ST segment changes appreciated. ? ? ? ?Assessment and Plan: ?* Rhabdomyolysis ?Patient presenting with generalized muscle aches malaise and markedly elevated creatinine kinase after going running consistent with early rhabdomyolysis ?Case is relatively mild at this point.  We will attempt to address it with aggressive intravenous volume resuscitation and close monitoring overnight ?We will obtain serial creatinine kinase levels to ensure downtrending and resolution ?Once creatinine kinase levels are convincingly downtrending patient can likely be discharged within 24 to 48 hours. ? ? ?Elevated troponin level not due myocardial infarction ?Minimally elevated troponin in the absence of  chest pain which is actually lower than what patient's troponin was 8 days ago during last ER presentation. ?No dynamic ST segment change noted on EKG ?Known history of coronary disease with history of NSTEMI 08/2020 secondary to 100% PDA occlusion managed medically ?Monitoring patient on telemetry ?We will cycle cardiac enzymes to peak although I believe plaque rupture is unlikely ?If troponins begin to trend upwards will obtain cardiology consultation. ? ?Coronary artery disease involving native coronary artery of native heart ?Patient is currently chest pain free ?Please see notations about elevated troponin above ?Monitoring patient on telemetry ?Continue home regimen of antiplatelet therapy, lipid lowering therapy although patient does not seem to be compliant with his regimen in the outpatient setting. ? ? ?Mixed hyperlipidemia ?Continuing home regimen of lipid lowering therapy. ?Obtaining repeat lipid panel  in the morning ? ? ? ?Essential hypertension ?Resume patients home regimen of oral antihypertensives ?Titrate antihypertensive regimen as necessary to achieve adequate BP control ?PRN intravenous antihypertensives for excessive

## 2021-05-13 NOTE — Assessment & Plan Note (Addendum)
?   Minimally elevated troponin in the absence of chest pain which is actually lower than what patient's troponin was 8 days ago during last ER presentation. ?? No dynamic ST segment change noted on EKG ?? Known history of coronary disease with history of NSTEMI 08/2020 secondary to 100% PDA occlusion managed medically ?

## 2021-05-13 NOTE — Discharge Summary (Signed)
?Physician Discharge Summary ?  ?Patient: Benjamin Beltran MRN: [id number] DOB: 14-Apr-1985  ?Admit date:     05/12/2021  ?Discharge date: 05/13/21  ?Discharge Physician: Marylu Lund  ? ?PCP: Patient, No Pcp Per (Inactive)  ? ?Patient Left Against Medical Advice ? ?Discharge Diagnoses: ?Principal Problem: ?  Rhabdomyolysis ?Active Problems: ?  Elevated troponin level not due myocardial infarction ?  Coronary artery disease involving native coronary artery of native heart ?  Mixed hyperlipidemia ?  Essential hypertension ? ?Resolved Problems: ?  * No resolved hospital problems. * ? ?Hospital Course: ?36 year old male with past medical history of coronary artery disease (status post NSTEMI 08/2020 with cath revealing 100% occlusion of the PDA managed medically), hypertension, marijuana use, nicotine dependence (Vape) presenting to the emergency department with complaints of headache and generalized body aches. ?  ?Patient explains that earlier in the day on 3/23 he decided to go for a jog.  Shortly after going on his run the patient began to feel a rather sudden onset of generalized malaise and weakness.  This became associated with generalized severe body aches, moderate to severe in intensity, sore in quality, worse with movement and improved with rest.  Patient symptoms continue to persist for several hours.  Patient, who has been in rhabdomyolysis in the past decided to present to [facility] emergency department for evaluation. ?  ?Upon evaluation in the emergency department chemistry revealed creatinine of 1.44.  Creatine kinase was found to be 4997.  Troponin was found to be slightly elevated at 37.  Patient was felt to clinically be suffering from early rhabdomyolysis.  Patient was initiated on intravenous fluids and the hospitalist group was then called to assess the patient for admission to the hospital. ? ?Assessment and Plan: ?* Rhabdomyolysis ?Patient presented with generalized muscle aches malaise and markedly  elevated creatinine kinase after going running consistent with early rhabdomyolysis ?Pt was continued on aggressvie IVF with CK initially improved from 4900 to 4000, but repeat up to 4700 ?Pt had since elected to leave against medical advice ? ? ?Elevated troponin level not due myocardial infarction ?Minimally elevated troponin in the absence of chest pain which is actually lower than what patient's troponin was 8 days ago during last ER presentation. ?No dynamic ST segment change noted on EKG ?Known history of coronary disease with history of NSTEMI 08/2020 secondary to 100% PDA occlusion managed medically ? ?Coronary artery disease involving native coronary artery of native heart ?Patient noted to be chest pain free ? ? ?Mixed hyperlipidemia ?On lipid lowering therapy prior to admit ? ? ? ?Essential hypertension ?BP remained stable and controlled off bp meds ? ? ? ? ? ? ?  ? ? ?Consultants:  ?Procedures performed:   ?Disposition:  Left AMA ? ?DISCHARGE MEDICATION: ?Allergies as of 05/13/2021   ? ?   Reactions  ? Antihistamines, Diphenhydramine-type   ? liver  ? Tylenol [acetaminophen]   ? Liver problems  ? ?  ? ?  ?Medication List  ?  ? ?STOP taking these medications   ? ?amLODipine 5 MG tablet ?Commonly known as: NORVASC ?  ?atorvastatin 40 MG tablet ?Commonly known as: LIPITOR ?  ?buPROPion 150 MG 24 hr tablet ?Commonly known as: WELLBUTRIN XL ?  ?ezetimibe 10 MG tablet ?Commonly known as: ZETIA ?  ?naproxen 500 MG tablet ?Commonly known as: Naprosyn ?  ? ?  ? ?TAKE these medications   ? ?aspirin EC 81 MG tablet ?Take 1 tablet (81 mg total) by mouth daily.  Swallow whole. ?  ?clopidogrel 75 MG tablet ?Commonly known as: PLAVIX ?Take 1 tablet (75 mg total) by mouth daily with breakfast. ?  ?nitroGLYCERIN 0.4 MG SL tablet ?Commonly known as: NITROSTAT ?Place 1 tablet (0.4 mg total) under the tongue every 5 (five) minutes x 3 doses as needed for chest pain. ?  ? ?  ? ? Follow-up Information   ? ? End, Christopher, MD  Follow up in 2 week(s).   ?Specialty: Cardiology ?Why: Hospital follow up ?Contact information: ?[address] ?[place] [address] ?[phone] ? ? ?  ?  ? ?  ?  ? ?  ? ?Discharge Exam: ?Filed Weights  ? 05/12/21 2221  ?Weight: 104.8 kg  ? ?General exam: Awake, laying in bed, in nad ?Respiratory system: Normal respiratory effort, no wheezing ?Cardiovascular system: regular rate, s1, s2 ?Gastrointestinal system: Soft, nondistended, positive BS ?Central nervous system: CN2-12 grossly intact, strength intact ?Extremities: Perfused, no clubbing ?Skin: Normal skin turgor, no notable skin lesions seen ?Psychiatry: Mood normal // no visual hallucinations  ? ?Condition at discharge:  Left AMA ? ?The results of significant diagnostics from this hospitalization (including imaging, microbiology, ancillary and laboratory) are listed below for reference.  ? ?Imaging Studies: ?DG Chest 2 View ? ?Result Date: 05/05/2021 ?CLINICAL DATA:  Chest pain and left arm pain EXAM: CHEST - 2 VIEW COMPARISON:  08/19/2020 FINDINGS: Normal heart size and pulmonary vascularity. No focal airspace disease or consolidation in the lungs. No blunting of costophrenic angles. No pneumothorax. Mediastinal contours appear intact. Postoperative changes in the cervical spine. IMPRESSION: No active cardiopulmonary disease. Electronically Signed   By: Lucienne Capers M.D.   On: 05/05/2021 03:45   ? ?Microbiology: ?Results for orders placed or performed during the hospital encounter of 08/20/20  ?Resp Panel by RT-PCR (Flu A&B, Covid) Nasopharyngeal Swab     Status: None  ? Collection Time: 08/20/20  1:01 AM  ? Specimen: Nasopharyngeal Swab; Nasopharyngeal(NP) swabs in vial transport medium  ?Result Value Ref Range Status  ? SARS Coronavirus 2 by RT PCR NEGATIVE NEGATIVE Final  ?  Comment: (NOTE) ?SARS-CoV-2 target nucleic acids are NOT DETECTED. ? ?The SARS-CoV-2 RNA is generally detectable in upper respiratory ?specimens during the acute  phase of infection. The lowest ?concentration of SARS-CoV-2 viral copies this assay can detect is ?138 copies/mL. A negative result does not preclude SARS-Cov-2 ?infection and should not be used as the sole basis for treatment or ?other patient management decisions. A negative result may occur with  ?improper specimen collection/handling, submission of specimen other ?than nasopharyngeal swab, presence of viral mutation(s) within the ?areas targeted by this assay, and inadequate number of viral ?copies(<138 copies/mL). A negative result must be combined with ?clinical observations, patient history, and epidemiological ?information. The expected result is Negative. ? ?Fact Sheet for Patients:  ?EntrepreneurPulse.com.au ? ?Fact Sheet for Healthcare Providers:  ?IncredibleEmployment.be ? ?This test is no t yet approved or cleared by the [place] FDA and  ?has been authorized for detection and/or diagnosis of SARS-CoV-2 by ?FDA under an Emergency Use Authorization (EUA). This EUA will remain  ?in effect (meaning this test can be used) for the duration of the ?COVID-19 declaration under Section 564(b)(1) of the Act, 21 ?U.S.C.section 360bbb-3(b)(1), unless the authorization is terminated  ?or revoked sooner.  ? ? ?  ? Influenza A by PCR NEGATIVE NEGATIVE Final  ? Influenza B by PCR NEGATIVE NEGATIVE Final  ?  Comment: (NOTE) ?The Xpert Xpress SARS-CoV-2/FLU/RSV plus assay  is intended as an aid ?in the diagnosis of influenza from Nasopharyngeal swab specimens and ?should not be used as a sole basis for treatment. Nasal washings and ?aspirates are unacceptable for Xpert Xpress SARS-CoV-2/FLU/RSV ?testing. ? ?Fact Sheet for Patients: ?EntrepreneurPulse.com.au ? ?Fact Sheet for Healthcare Providers: ?IncredibleEmployment.be ? ?This test is not yet approved or cleared by the [place] FDA and ?has been authorized for detection and/or diagnosis of  SARS-CoV-2 by ?FDA under an Emergency Use Authorization (EUA). This EUA will remain ?in effect (meaning this test can be used) for the duration of the ?COVID-19 declaration under Section 564(b)(1) of the Ac

## 2021-05-13 NOTE — Assessment & Plan Note (Addendum)
?   Patient noted to be chest pain free ? ?

## 2021-05-13 NOTE — Assessment & Plan Note (Addendum)
?   BP remained stable and controlled off bp meds ? ? ?

## 2021-05-13 NOTE — Assessment & Plan Note (Addendum)
?   Patient presented with generalized muscle aches malaise and markedly elevated creatinine kinase after going running consistent with early rhabdomyolysis ?? Pt was continued on aggressvie IVF with CK initially improved from 4900 to 4000, but repeat up to 4700 ?? Pt had since elected to leave against medical advice ? ?

## 2021-05-13 NOTE — Plan of Care (Signed)

## 2021-05-13 NOTE — Hospital Course (Signed)
36 year old male with past medical history of coronary artery disease (status post NSTEMI 08/2020 with cath revealing 100% occlusion of the PDA managed medically), hypertension, marijuana use, nicotine dependence (Vape) presenting to the emergency department with complaints of headache and generalized body aches. ?  ?Patient explains that earlier in the day on 3/23 he decided to go for a jog.  Shortly after going on his run the patient began to feel a rather sudden onset of generalized malaise and weakness.  This became associated with generalized severe body aches, moderate to severe in intensity, sore in quality, worse with movement and improved with rest.  Patient symptoms continue to persist for several hours.  Patient, who has been in rhabdomyolysis in the past decided to present to [facility] emergency department for evaluation. ?  ?Upon evaluation in the emergency department chemistry revealed creatinine of 1.44.  Creatine kinase was found to be 4997.  Troponin was found to be slightly elevated at 37.  Patient was felt to clinically be suffering from early rhabdomyolysis.  Patient was initiated on intravenous fluids and the hospitalist group was then called to assess the patient for admission to the hospital. ?

## 2021-05-13 NOTE — Progress Notes (Signed)
PHARMACIST - PHYSICIAN COMMUNICATION ? ?CONCERNING:  Enoxaparin (Lovenox) for DVT Prophylaxis  ? ? ?RECOMMENDATION: ?Patient was prescribed enoxaprin 40mg  q24 hours for VTE prophylaxis.  ? ?Filed Weights  ? 05/12/21 2221  ?Weight: 104.8 kg (231 lb)  ? ? ?Body mass index is 34.11 kg/m?. ? ?Estimated Creatinine Clearance: 84.6 mL/min (A) (by C-G formula based on SCr of 1.44 mg/dL (H)). ? ? ?Based on [facility] policy patient is candidate for enoxaparin 0.5mg /kg TBW SQ every 24 hours based on BMI being >30. ? ?DESCRIPTION: ?Pharmacy has adjusted enoxaparin dose per [facility] policy. ? ?Patient is now receiving enoxaparin 0.5 mg/kg every 24 hours  ? ? ?[facility], PharmD, MBA ?05/13/2021 ?1:42 AM ? ? ?

## 2021-05-13 NOTE — ED Provider Notes (Signed)
? ?[facility] ?Provider Note ? ? ? Event Date/Time  ? First MD Initiated Contact with Patient 05/13/21 0024   ?  (approximate) ? ? ?History  ? ?Headache and Generalized Body Aches ? ? ?HPI ? ?Benjamin Beltran is a 36 y.o. male patient got dehydrated after running this afternoon.  He came in complaining of body aches and headache.  He reports his headache is now better but still present.  He is just mild.  His nausea is better.  His CK level is 4997.  He says he had rhabdo before.  It looks like he has had it again.  His creatinine is now 1.44 which is somewhat elevated from his previous level of 1.21.  We will begin fluids. ? ?  ? ? ?Physical Exam  ? ?Triage Vital Signs: ?ED Triage Vitals [05/12/21 2221]  ?Enc Vitals Group  ?   BP 115/79  ?   Pulse Rate 73  ?   Resp 17  ?   Temp 98.6 ?F (37 ?C)  ?   Temp Source Oral  ?   SpO2 97 %  ?   Weight 231 lb (104.8 kg)  ?   Height 5\' 9"  (1.753 m)  ?   Head Circumference   ?   Peak Flow   ?   Pain Score 9  ?   Pain Loc   ?   Pain Edu?   ?   Excl. in GC?   ? ? ?Most recent vital signs: ?Vitals:  ? 05/12/21 2221 05/13/21 0015  ?BP: 115/79 122/77  ?Pulse: 73 77  ?Resp: 17 18  ?Temp: 98.6 ?F (37 ?C)   ?SpO2: 97% 98%  ? ? ? ?General: Awake, alert no distress.  ?CV:  Good peripheral perfusion.  Heart regular rate and rhythm no audible murmurs ?Resp:  Normal effort.  Lungs are clear ?Abd:  No distention.  Soft no organomegaly ?Extremities no edema no muscle tenderness currently ? ? ?ED Results / Procedures / Treatments  ? ?Labs ?(all labs ordered are listed, but only abnormal results are displayed) ?Labs Reviewed  ?BASIC METABOLIC PANEL - Abnormal; Notable for the following components:  ?    Result Value  ? Glucose, Bld 117 (*)   ? Creatinine, Ser 1.44 (*)   ? All other components within normal limits  ?CK - Abnormal; Notable for the following components:  ? Total CK 4,997 (*)   ? All other components within normal limits  ?TROPONIN I (HIGH SENSITIVITY) -  Abnormal; Notable for the following components:  ? Troponin I (High Sensitivity) 37 (*)   ? All other components within normal limits  ?CBC  ?URINALYSIS, ROUTINE W REFLEX MICROSCOPIC  ?URINE DRUG SCREEN, QUALITATIVE ([facility] ONLY)  ?CK  ?COMPREHENSIVE METABOLIC PANEL  ?MAGNESIUM  ?CBC WITH DIFFERENTIAL/PLATELET  ? ? ? ?EKG ? ?EKG G read interpreted by me shows sinus bradycardia rate of 57 normal axis he has flipped T's inferiorly and in the lateral chest leads.  These are similar to EKG from last week. ? ? ?RADIOLOGY ? ? ? ?PROCEDURES: ? ?Critical Care performed:  ? ?Procedures ? ? ?MEDICATIONS ORDERED IN ED: ?Medications  ?hydrALAZINE (APRESOLINE) injection 10 mg (has no administration in time range)  ?0.9 %  sodium chloride infusion ( Intravenous New Bag/Given 05/13/21 0149)  ?aspirin EC tablet 81 mg (has no administration in time range)  ?atorvastatin (LIPITOR) tablet 40 mg (has no administration in time range)  ?clopidogrel (PLAVIX) tablet 75 mg (has  no administration in time range)  ?ezetimibe (ZETIA) tablet 10 mg (has no administration in time range)  ?nitroGLYCERIN (NITROSTAT) SL tablet 0.4 mg (has no administration in time range)  ?enoxaparin (LOVENOX) injection 52.5 mg (has no administration in time range)  ?polyethylene glycol (MIRALAX / GLYCOLAX) packet 17 g (has no administration in time range)  ?ondansetron (ZOFRAN) tablet 4 mg (has no administration in time range)  ?  Or  ?ondansetron (ZOFRAN) injection 4 mg (has no administration in time range)  ?lactated ringers bolus 1,000 mL (0 mLs Intravenous Stopped 05/13/21 0138)  ?lactated ringers bolus 1,000 mL (0 mLs Intravenous Stopped 05/13/21 0138)  ? ? ? ?IMPRESSION / MDM / ASSESSMENT AND PLAN / ED COURSE  ?I reviewed the triage vital signs and the nursing notes. ?Patient with rhabdo with a CK of 4997 and mild bump in creatinine.  We will give him IV fluids.  Initial troponin was somewhat elevated at 37 but this is lower than it has been previously.  Second  troponin is still pending. ? 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  In fact he says his sons had it once as well.  We will plan on getting him in the hospital and giving him IV fluids watching him and likely discharging him later today. ? ? ?  ? ? ?FINAL CLINICAL IMPRESSION(S) / ED DIAGNOSES  ? ?Final diagnoses:  ?Non-traumatic rhabdomyolysis  ? ? ? ?Rx / DC Orders  ? ?ED Discharge Orders   ? ? None  ? ?  ? ? ? ?Note:  This document was prepared using Dragon voice recognition software and may include unintentional dictation errors. ?  ?Arnaldo Natal, MD ?05/13/21 [phone] ? ?

## 2021-05-13 NOTE — Assessment & Plan Note (Addendum)
?   On lipid lowering therapy prior to admit ? ? ?

## 2021-05-19 ENCOUNTER — Encounter: Payer: Self-pay | Admitting: Emergency Medicine

## 2021-05-19 ENCOUNTER — Emergency Department: Payer: Self-pay

## 2021-05-19 ENCOUNTER — Other Ambulatory Visit: Payer: Self-pay

## 2021-05-19 ENCOUNTER — Emergency Department
Admission: EM | Admit: 2021-05-19 | Discharge: 2021-05-19 | Disposition: A | Discharge status: Home / Self Care | Payer: Self-pay | Attending: Emergency Medicine | Admitting: Emergency Medicine

## 2021-05-19 DIAGNOSIS — I10 Essential (primary) hypertension: Secondary | ICD-10-CM | POA: Insufficient documentation

## 2021-05-19 DIAGNOSIS — I82612 Acute embolism and thrombosis of superficial veins of left upper extremity: Secondary | ICD-10-CM | POA: Insufficient documentation

## 2021-05-19 DIAGNOSIS — I2510 Atherosclerotic heart disease of native coronary artery without angina pectoris: Secondary | ICD-10-CM | POA: Insufficient documentation

## 2021-05-19 DIAGNOSIS — R079 Chest pain, unspecified: Secondary | ICD-10-CM | POA: Insufficient documentation

## 2021-05-19 DIAGNOSIS — R0602 Shortness of breath: Secondary | ICD-10-CM | POA: Insufficient documentation

## 2021-05-19 DIAGNOSIS — M79602 Pain in left arm: Secondary | ICD-10-CM

## 2021-05-19 LAB — D-DIMER, QUANTITATIVE: D-Dimer, Quant: 0.31 ug/mL-FEU (ref 0.00–0.50)

## 2021-05-19 LAB — CBC
HCT: 42.4 % (ref 39.0–52.0)
Hemoglobin: 13.4 g/dL (ref 13.0–17.0)
MCH: 27.6 pg (ref 26.0–34.0)
MCHC: 31.6 g/dL (ref 30.0–36.0)
MCV: 87.2 fL (ref 80.0–100.0)
Platelets: 204 10*3/uL (ref 150–400)
RBC: 4.86 MIL/uL (ref 4.22–5.81)
RDW: 12.3 % (ref 11.5–15.5)
WBC: 4.3 10*3/uL (ref 4.0–10.5)
nRBC: 0 % (ref 0.0–0.2)

## 2021-05-19 LAB — TROPONIN I (HIGH SENSITIVITY)
Troponin I (High Sensitivity): 17 ng/L (ref <18)
Troponin I (High Sensitivity): 19 ng/L — ABNORMAL HIGH (ref <18)

## 2021-05-19 LAB — BASIC METABOLIC PANEL
Anion gap: 6 (ref 5–15)
BUN: 16 mg/dL (ref 6–20)
CO2: 24 mmol/L (ref 22–32)
Calcium: 8.2 mg/dL — ABNORMAL LOW (ref 8.9–10.3)
Chloride: 101 mmol/L (ref 98–111)
Creatinine, Ser: 1.42 mg/dL — ABNORMAL HIGH (ref 0.61–1.24)
GFR, Estimated: >60 mL/min (ref >60)
Glucose, Bld: 150 mg/dL — ABNORMAL HIGH (ref 70–99)
Potassium: 3.7 mmol/L (ref 3.5–5.1)
Sodium: 131 mmol/L — ABNORMAL LOW (ref 135–145)

## 2021-05-19 LAB — CK
Total CK: 805 U/L — ABNORMAL HIGH (ref 49–397)
Total CK: 713 U/L — ABNORMAL HIGH (ref 49–397)

## 2021-05-19 LAB — URINE DRUG SCREEN, QUALITATIVE (ARMC ONLY)
Amphetamines, Ur Screen: NOT DETECTED
Barbiturates, Ur Screen: NOT DETECTED
Benzodiazepine, Ur Scrn: NOT DETECTED
Cannabinoid 50 Ng, Ur ~~LOC~~: POSITIVE — AB
Cocaine Metabolite,Ur ~~LOC~~: NOT DETECTED
MDMA (Ecstasy)Ur Screen: NOT DETECTED
Methadone Scn, Ur: NOT DETECTED
Opiate, Ur Screen: NOT DETECTED
Phencyclidine (PCP) Ur S: NOT DETECTED
Tricyclic, Ur Screen: NOT DETECTED

## 2021-05-19 MED ORDER — APIXABAN 5 MG PO TABS
10.0000 mg | ORAL_TABLET | Freq: Once | ORAL | Status: AC
  Administered 2021-05-19: 10 mg via ORAL
  Filled 2021-05-19: qty 2

## 2021-05-19 MED ORDER — SODIUM CHLORIDE 0.9 % IV BOLUS (SEPSIS)
1000.0000 mL | Freq: Once | INTRAVENOUS | Status: DC
Start: 2021-05-19 — End: 2021-05-19

## 2021-05-19 MED ORDER — APIXABAN (ELIQUIS) VTE STARTER PACK (10MG AND 5MG): ORAL_TABLET | ORAL | 0 refills | Status: AC

## 2021-05-19 MED ORDER — SODIUM CHLORIDE 0.9 % IV BOLUS (SEPSIS)
1000.0000 mL | Freq: Once | INTRAVENOUS | Status: AC
Start: 2021-05-19 — End: 2021-05-19
  Administered 2021-05-19: 1000 mL via INTRAVENOUS

## 2021-05-19 MED ORDER — SODIUM CHLORIDE 0.9 % IV BOLUS (SEPSIS)
2000.0000 mL | Freq: Once | INTRAVENOUS | Status: AC
  Administered 2021-05-19: 2000 mL via INTRAVENOUS

## 2021-05-19 MED ORDER — IOHEXOL 350 MG/ML SOLN
75.0000 mL | Freq: Once | INTRAVENOUS | Status: AC | PRN
  Administered 2021-05-19: 75 mL via INTRAVENOUS

## 2021-05-19 NOTE — TOC Initial Note (Signed)
Transition of Care (TOC) - Initial/Assessment Note  ? ? ?Patient Details  ?Name: Benjamin Beltran ?MRN: [id number] ?Date of Birth: [birth year] ? ?Transition of Care (TOC) CM/SW Contact:    ?Allayne Butcher, RN ?Phone Number: ?05/19/2021, 9:48 AM ? ?Clinical Narrative:                 ?Patient came into the emergency room for chest pain and left arm pain.  Patient was seen recently for the same discharged on 3/24.  TOC consult for medication assistance, patient will be discharged on Eliquis and his insurance does not kick in until April 1st.  RNCM provide patient with 30 day free Eliquis coupon.   ? ?Expected Discharge Plan: Home/Self Care ?  ? ? ?Patient Goals and CMS Choice ?  ?  ?  ? ?Expected Discharge Plan and Services ?Expected Discharge Plan: Home/Self Care ?  ?Discharge Planning Services: CM Consult, Medication Assistance ?  ?Living arrangements for the past 2 months: Single Family Home ?                ?  ?  ?  ?  ?  ?  ?  ?  ?  ?  ? ?Prior Living Arrangements/Services ?Living arrangements for the past 2 months: Single Family Home ?Lives with:: Spouse ?Patient language and need for interpreter reviewed:: Yes ?Do you feel safe going back to the place where you live?: Yes      ?Need for Family Participation in Patient Care: Yes (Comment) ?Care giver support system in place?: Yes (comment) ?  ?Criminal Activity/Legal Involvement Pertinent to Current Situation/Hospitalization: No - Comment as needed ? ?Activities of Daily Living ?  ?  ? ?Permission Sought/Granted ?  ?  ?   ?   ?   ?   ? ?Emotional Assessment ?Appearance:: Appears stated age ?Attitude/Demeanor/Rapport: Engaged ?Affect (typically observed): Accepting ?Orientation: : Oriented to Self, Oriented to Place, Oriented to  Time, Oriented to Situation ?Alcohol / Substance Use: Not Applicable ?Psych Involvement: No (comment) ? ?Admission diagnosis:  Chest Pain ?Patient Active Problem List  ? Diagnosis Date Noted  ? Rhabdomyolysis 05/13/2021  ? Coronary artery  disease involving native coronary artery of native heart 05/13/2021  ? Elevated troponin level not due myocardial infarction 05/13/2021  ? Mixed hyperlipidemia 05/13/2021  ? Tobacco abuse counseling   ? Elevated LFTs   ? Chest pain 08/20/2020  ? NSTEMI (non-ST elevated myocardial infarction) (HCC)   ? Essential hypertension   ? Precordial pain 07/22/2020  ? Shortness of breath 07/22/2020  ? Abnormal EKG 07/22/2020  ? 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 Acute renal failure due to rhabdomyolysis (HCC) 08/11/2016  ? ?PCP:  Patient, No Pcp Per (Inactive) ?Pharmacy:   ?[id number] - [place] ([place]),  - [address] ?[address] ?[place] [address] ?Phone: [phone] Fax: [phone] ? ? ? ? ?Social Determinants of Health (SDOH) Interventions ?  ? ?Readmission Risk Interventions ?   ? View : No data to display.  ?  ?  ?  ? ? ? ?

## 2021-05-19 NOTE — ED Notes (Signed)
Pt may eat and drink per EDP, pt given water and graham crackers, diet ordered.  ?

## 2021-05-19 NOTE — ED Provider Notes (Signed)
? ?[facility] ?Provider Note ? ? ? Event Date/Time  ? First MD Initiated Contact with Patient 05/19/21 [phone]   ?  (approximate) ? ? ?History  ? ?Chest Pain and Arm Pain ? ? ?HPI ? ?Benjamin Beltran is a 36 y.o. male with history of CAD with 100% PDA occlusion that has been managed medically, hypertension, hyperlipidemia, recent admission to the hospital for rhabdomyolysis after increased physical exertion/training who presents to the emergency department with continued sharp left-sided chest pain, shortness of breath and feeling dehydrated.  He states decreased appetite but no vomiting or diarrhea.  Reports he is now having left upper extremity pain where his IV was placed but this arm is not red, warm, swollen or bruised.  Denies any injury.  States he has not been exercising since his recent admission.  He has been trying to increase his fluid intake.  He denies any cocaine, methamphetamine use.  He denies any aggravating or alleviating factors to his chest pain.  Denies previous history of PE or DVT. ? ?Patient left the hospital AGAINST MEDICAL ADVICE on 05/13/2021. ? ? ?History provided by patient. ? ? ? ?Past Medical History:  ?Diagnosis Date  ? CAD (coronary artery disease)   ? a. 07/2020 NSTEMI/Cath: LM nl, LAD nl, LCX nl, RCA nl, RPDA 100. EF 55-65% -->Med rx.  ? Elevated liver function tests   ? Essential hypertension   ? History of echocardiogram   ? a. 07/2020 Echo: EF 60-65%, no rwma, nl RV fxn. No significant valvular dzs.  ? Rhabdomyolysis   ? Tobacco abuse   ? Vapes nicotine containing substance   ? ? ?Past Surgical History:  ?Procedure Laterality Date  ? LEFT HEART CATH AND CORONARY ANGIOGRAPHY N/A 08/20/2020  ? Procedure: LEFT HEART CATH AND CORONARY ANGIOGRAPHY;  Surgeon: Iran Ouch, MD;  Location: [facility] INVASIVE CV LAB;  Service: Cardiovascular;  Laterality: N/A;  ? NECK SURGERY    ? tendon surgery to left hand  unk  ? ? ?MEDICATIONS:  ?Prior to Admission medications    ?Medication Sig Start Date End Date Taking? Authorizing Provider  ?aspirin EC 81 MG tablet Take 1 tablet (81 mg total) by mouth daily. Swallow whole. ?Patient not taking: Reported on 05/13/2021 07/21/20   End, Cristal Deer, MD  ?clopidogrel (PLAVIX) 75 MG tablet Take 1 tablet (75 mg total) by mouth daily with breakfast. ?Patient not taking: Reported on 05/13/2021 02/28/21   End, Cristal Deer, MD  ?nitroGLYCERIN (NITROSTAT) 0.4 MG SL tablet Place 1 tablet (0.4 mg total) under the tongue every 5 (five) minutes x 3 doses as needed for chest pain. 08/21/20   Enedina Finner, MD  ? ? ?Physical Exam  ? ?Triage Vital Signs: ?ED Triage Vitals  ?Enc Vitals Group  ?   BP 05/19/21 0128 131/82  ?   Pulse Rate 05/19/21 0128 72  ?   Resp 05/19/21 0128 16  ?   Temp 05/19/21 0128 97.8 ?F (36.6 ?C)  ?   Temp Source 05/19/21 0128 Oral  ?   SpO2 05/19/21 0128 97 %  ?   Weight 05/19/21 0118 233 lb (105.7 kg)  ?   Height 05/19/21 0118 5\' 9"  (1.753 m)  ?   Head Circumference --   ?   Peak Flow --   ?   Pain Score 05/19/21 0117 8  ?   Pain Loc --   ?   Pain Edu? --   ?   Excl. in GC? --   ? ? ?  Most recent vital signs: ?Vitals:  ? 05/19/21 0600 05/19/21 0630  ?BP: 105/74 111/74  ?Pulse: 69 (!) 47  ?Resp: 20 14  ?Temp:    ?SpO2: 100% 100%  ? ? ?CONSTITUTIONAL: Alert and oriented and responds appropriately to questions. Well-appearing; well-nourished ?HEAD: Normocephalic, atraumatic ?EYES: Conjunctivae clear, pupils appear equal, sclera nonicteric ?ENT: normal nose; moist mucous membranes ?NECK: Supple, normal ROM ?CARD: RRR; S1 and S2 appreciated; no murmurs, no clicks, no rubs, no gallops ?RESP: Normal chest excursion without splinting or tachypnea; breath sounds clear and equal bilaterally; no wheezes, no rhonchi, no rales, no hypoxia or respiratory distress, speaking full sentences ?ABD/GI: Normal bowel sounds; non-distended; soft, non-tender, no rebound, no guarding, no peritoneal signs ?BACK: The back appears normal ?EXT: Normal ROM in all joints;  no deformity noted, no edema; no cyanosis, compartments in the left upper extremity are soft, extremities warm and well-perfused, no calf tenderness or calf swelling, no redness or warmth noted to the left upper extremity, no deformity, no joint effusion.  No ecchymosis.  2+ left radial pulse.  Normal capillary refill.  Normal sensation throughout the left upper extremity. ?SKIN: Normal color for age and race; warm; no rash on exposed skin ?NEURO: Moves all extremities equally, normal speech ?PSYCH: The patient's mood and manner are appropriate. ? ? ?ED Results / Procedures / Treatments  ? ?LABS: ?(all labs ordered are listed, but only abnormal results are displayed) ?Labs Reviewed  ?BASIC METABOLIC PANEL - Abnormal; Notable for the following components:  ?    Result Value  ? Sodium 131 (*)   ? Glucose, Bld 150 (*)   ? Creatinine, Ser 1.42 (*)   ? Calcium 8.2 (*)   ? All other components within normal limits  ?CK - Abnormal; Notable for the following components:  ? Total CK 805 (*)   ? All other components within normal limits  ?URINE DRUG SCREEN, QUALITATIVE ([facility] ONLY) - Abnormal; Notable for the following components:  ? Cannabinoid 50 Ng, Ur [place] POSITIVE (*)   ? All other components within normal limits  ?CK - Abnormal; Notable for the following components:  ? Total CK 713 (*)   ? All other components within normal limits  ?TROPONIN I (HIGH SENSITIVITY) - Abnormal; Notable for the following components:  ? Troponin I (High Sensitivity) 19 (*)   ? All other components within normal limits  ?CBC  ?D-DIMER, QUANTITATIVE  ?TROPONIN I (HIGH SENSITIVITY)  ? ? ? ?EKG: ? EKG Interpretation ? ?Date/Time:  Thursday May 19 2021 01:17:31 EDT ?Ventricular Rate:  53 ?PR Interval:  152 ?QRS Duration: 94 ?QT Interval:  390 ?QTC Calculation: 365 ?R Axis:   73 ?Text Interpretation: Sinus bradycardia inferior and lateral t wave inversions No significant change since last tracing Confirmed by Tekila Caillouet, Baxter Hire [phone]) on 05/19/2021  4:46:06 AM ?  ? ?  ? ? ? ?RADIOLOGY: ?My personal review and interpretation of imaging: Venous Doppler of the left upper extremity shows a left basilic vein thrombus.  CTA of the chest reviewed by myself and shows no obvious PE but official radiology read pending. ? ?I have personally reviewed all radiology reports.   ?US Venous Img Upper Uni Left ? ?Addendum Date: 05/19/2021   ?ADDENDUM REPORT: 05/19/2021 06:31 ADDENDUM: Study discussed by telephone with Dr. Baxter Hire Fletcher Ostermiller on 05/19/2021 at 0626 hours. Electronically Signed   By: Odessa Fleming M.D.   On: 05/19/2021 06:31  ? ?Result Date: 05/19/2021 ?CLINICAL DATA:  36 year old male left upper extremity pain and  swelling for 4 days. Rhabdomyolysis. EXAM: LEFT UPPER EXTREMITY VENOUS DOPPLER ULTRASOUND TECHNIQUE: Gray-scale sonography with graded compression, as well as color Doppler and duplex ultrasound were performed to evaluate the upper extremity deep venous system from the level of the subclavian vein and including the jugular, axillary, basilic, radial, ulnar and upper cephalic vein. Spectral Doppler was utilized to evaluate flow at rest and with distal augmentation maneuvers. COMPARISON:  None. FINDINGS: Contralateral Subclavian Vein: Respiratory phasicity is normal and symmetric with the symptomatic side. No evidence of thrombus. Normal compressibility. Internal Jugular Vein: No evidence of thrombus. Normal compressibility, respiratory phasicity and response to augmentation. Subclavian Vein: No evidence of thrombus. Normal compressibility, respiratory phasicity and response to augmentation. Axillary Vein: No evidence of thrombus. Normal compressibility, respiratory phasicity and response to augmentation. Cephalic Vein: No evidence of thrombus. Normal compressibility, respiratory phasicity and response to augmentation. Basilic Vein: Incompletely compressible on image 18 with some echogenic thrombus within the lumen which tracks to the antecubital fossa (image 20) and the  forearm, with loss of color Doppler flow (image 21). Brachial Veins: No evidence of thrombus. Normal compressibility, respiratory phasicity and response to augmentation. Radial Veins: No evidence of thrombus. Nor

## 2021-05-19 NOTE — ED Triage Notes (Signed)
Pt to ED from home c/o left chest pain and left arm pain for about 4 days.  States was seen here recently and the vein in his left arm where the IV was is hurting from forearm up to upper arm into chest and the vein is hard, hurts with movement.  Denies new SOB.  States having some of same symptoms like headache, feeling dehydrated since here last time with elevated CK.  Pt A&Ox4, chest rise even and unlabored, skin WNL and in NAD at this time. ?

## 2021-05-19 NOTE — ED Provider Notes (Signed)
----------------------------------------- ?  11:13 AM on 05/19/2021 ?----------------------------------------- ?I personally reviewed the CT images no significant PE seen on my evaluation. ?Radiology is read the CT scan is negative for PE. ? ?Social worker has seen the patient has provided a coupon for 30 days of free Eliquis which should cover the patient until his insurance kicks in.  Patient to follow-up with his primary care doctor.  We will discharge on Eliquis. ?  ?Minna Antis, MD ?05/19/21 1114 ? ?

## 2021-05-19 NOTE — Discharge Instructions (Addendum)
Steps to find a Primary Care Provider (PCP): ? ?Call [phone] or [phone] to access "[place] [facility] Service." ? ?2.  You may also go on the [place] website at [email or web address] ? ? ?You may take over-the-counter Tylenol 1000 mg every 6 hours as needed for pain.  Please avoid NSAIDs such as ibuprofen, aspirin, Aleve while on blood thinners.  You may keep this arm elevated when at rest and apply ice and wear compression sleeve which can help with discomfort and swelling. ? ?I recommend close follow-up with a primary care doctor to have your CK level and your kidney function rechecked.  Please continue to increase your water intake and avoid any strenuous activity at this time. ? ?Also recommend close follow-up with a cardiologist given your cardiac history and continued chest pain.  Your cardiac work-up today has been reassuring. ?

## 2021-05-19 NOTE — ED Notes (Signed)
Pt refusing chest x ray at this time  

## 2021-05-23 ENCOUNTER — Ambulatory Visit: Payer: 59

## 2021-06-06 ENCOUNTER — Ambulatory Visit: Payer: 59

## 2021-09-27 ENCOUNTER — Ambulatory Visit: Payer: Self-pay | Admitting: Family Medicine

## 2021-09-27 ENCOUNTER — Encounter: Payer: Self-pay | Admitting: Family Medicine

## 2021-09-27 DIAGNOSIS — Z8619 Personal history of other infectious and parasitic diseases: Secondary | ICD-10-CM

## 2021-09-27 DIAGNOSIS — Z113 Encounter for screening for infections with a predominantly sexual mode of transmission: Secondary | ICD-10-CM

## 2021-09-27 LAB — HEPATITIS B SURFACE ANTIGEN

## 2021-09-27 LAB — GRAM STAIN

## 2021-09-27 LAB — HM HEPATITIS C SCREENING LAB: HM Hepatitis Screen: NEGATIVE

## 2021-09-27 LAB — HM HIV SCREENING LAB: HM HIV Screening: NEGATIVE

## 2021-09-27 MED ORDER — DOXYCYCLINE HYCLATE 100 MG PO TABS: 100.0000 mg | ORAL_TABLET | Freq: Two times a day (BID) | ORAL | 0 refills | Status: AC

## 2021-09-27 MED ORDER — VALACYCLOVIR HCL 1 G PO TABS: 1000.0000 mg | ORAL_TABLET | Freq: Every day | ORAL | 11 refills | Status: AC

## 2021-09-27 NOTE — Progress Notes (Signed)
[facility] Department STI clinic/screening visit  Subjective:  Benjamin Beltran is a 36 y.o. male being seen today for an STI screening visit. The patient reports they do have symptoms.    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  States he noticed clear-white discharge and discomfort (mild burning) yesterday.  States he has a new partner since his separation. Client states that he uses condoms but has had breakage.  Does the patient or their partner desires a pregnancy in the next year? No  Screening for MPX risk: Does the patient have an unexplained rash? No Is the patient MSM? No Does the patient endorse multiple sex partners or anonymous sex partners? No Did the patient have close or sexual contact with a person diagnosed with MPX? No Has the patient traveled outside the [place] where MPX is endemic? No Is there a high clinical suspicion for MPX-- evidenced by one of the following No  -Unlikely to be chickenpox  -Lymphadenopathy  -Rash that present in same phase of evolution on any given body part   See flowsheet for further details and programmatic requirements.   Immunization  History  Administered Date(s) Administered   Hepatitis B 12/10/1995, 01/11/1996   Td 10/05/2016   Tdap 10/21/2007     The following portions of the patient's history were reviewed and updated as appropriate: allergies, current medications, past medical history, past social history, past surgical history and problem list.  Objective:  There were no vitals filed for this visit.  Physical Exam Constitutional:      Appearance: Normal appearance.  HENT:     Head: Normocephalic and atraumatic.     Comments: No nits or hair loss    Mouth/Throat:     Mouth: Mucous membranes are moist.     Pharynx: Oropharynx is clear. No oropharyngeal exudate or posterior oropharyngeal erythema.  Pulmonary:     Effort: Pulmonary effort is normal.  Abdominal:     General: Abdomen is flat.     Palpations: Abdomen is soft. There is no hepatomegaly or mass.     Tenderness: There is no abdominal tenderness.  Genitourinary:    Pubic Area: No rash or pubic lice (no nits).      Penis: Normal. No tenderness, discharge, swelling or lesions.      Testes: Normal.     Epididymis:     Right: Normal.     Left: Normal.  Lymphadenopathy:     Head:     Right side of head: No preauricular or posterior auricular adenopathy.     Left side of head: No preauricular or posterior auricular adenopathy.     Cervical: No cervical  adenopathy.     Upper Body:     Right upper body: No supraclavicular, axillary or epitrochlear adenopathy.     Left upper body: No supraclavicular, axillary or epitrochlear adenopathy.     Lower Body: No right inguinal adenopathy. No left inguinal adenopathy.  Skin:    General: Skin is warm and dry.     Findings: No rash.  Neurological:     Mental Status: He is alert and oriented to person, place, and time.     Assessment and Plan:  [name] is a 36 y.o. male presenting to the [facility] Department for STI screening  1. Screening examination for venereal disease  -  Gram stain - Chlamydia/GC NAA, Confirmation - HIV/HCV [place] [facility] - Hepatitis Serology, [place] [facility] - Syphilis Serology, [place] [facility] - Gonococcus culture - Gonococcus culture Client requests presumptive treatment for chlamydia. Doxycycline 100 mg po BID x 7 days. Co to use condoms always for STD and pregnancy prevention.  2. 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  States he take RX for episodic treatment - valACYclovir (VALTREX) 1000 MG tablet; Take 1 tablet (1,000 mg total) by mouth daily for 5 days.  Dispense: 5 tablet; Refill: 11     No follow-ups on file.  No future appointments.  Larene Pickett, FNP

## 2021-09-27 NOTE — Progress Notes (Signed)
Pt here for STD screening.  Gram stain results reviewed.  The patient was dispensed Doxycycline 100 mg #14 today. I provided counseling today regarding the medication. We discussed the medication, the side effects and when to call clinic. Patient given the opportunity to ask questions. Questions answered. Condoms given.  Berdie Ogren, RN

## 2021-09-29 LAB — CHLAMYDIA/GC NAA, CONFIRMATION
Chlamydia trachomatis, NAA: NEGATIVE
Neisseria gonorrhoeae, NAA: NEGATIVE

## 2021-10-01 LAB — GONOCOCCUS CULTURE

## 2021-10-03 ENCOUNTER — Encounter: Payer: 59 | Admitting: Physician Assistant

## 2021-10-05 NOTE — Progress Notes (Signed)
This encounter was created in error - please disregard.

## 2021-10-17 ENCOUNTER — Telehealth: Payer: Self-pay | Admitting: Family Medicine

## 2021-11-08 ENCOUNTER — Telehealth: Payer: Self-pay | Admitting: Internal Medicine

## 2021-11-08 MED ORDER — NITROGLYCERIN 0.4 MG SL SUBL: 0.4000 mg | SUBLINGUAL_TABLET | SUBLINGUAL | 1 refills | Status: AC | PRN

## 2021-11-08 NOTE — Telephone Encounter (Signed)
Received a call on the triage line from call center. Advised to transfer call to my desk line. Was told patient then hung up phone. Called patient back and discussed going to the ED. Patient has a history of NSTEMI. He is currently SOB, having continuous CP. Patient stated that he did understand the urgency. I sent a refill in for patients Nitro as he stated that he was out. Emphasized again that he should go to the ED as soon as possible. Patient verbalized understanding and agreed with plan.

## 2021-11-08 NOTE — Telephone Encounter (Signed)
Pt c/o Shortness Of Breath: STAT if SOB developed within the last 24 hours or pt is noticeably SOB on the phone  1. Are you currently SOB (can you hear that pt is SOB on the phone)? yes  2. How long have you been experiencing SOB? Three to four days   3. Are you SOB when sitting or when up moving around? Both   4. Are you currently experiencing any other symptoms? No   Pt c/o of Chest Pain: STAT if CP now or developed within 24 hours  1. Are you having CP right now? yes  2. Are you experiencing any other symptoms (ex. SOB, nausea, vomiting, sweating)? SOB  3. How long have you been experiencing CP? 4 days  4. Is your CP continuous or coming and going? Continuous   5. Have you taken Nitroglycerin? No, doesn't have anymore  ?

## 2021-11-09 ENCOUNTER — Ambulatory Visit: Payer: 59 | Admitting: Physician Assistant

## 2021-11-09 NOTE — Progress Notes (Deleted)
Cardiology Office Note    Date:  11/09/2021   ID:  Benjamin Beltran, DOB 1985/12/20, MRN [id number]  PCP:  Patient, No Pcp Per  Cardiologist:  Nelva Bush, MD  Electrophysiologist:  None   Chief Complaint: ***  History of Present Illness:   Benjamin Beltran is a 36 y.o. male with history of CAD with NSTEMI in 08/2020 medically managed as outlined below with recurrent chest pain, hypertension, superficial left basilic vein thrombus, tobacco/vape use, marijuana use, and abnormal LFTs who presents for ***.  He was evaluated in our office in 07/2020 with history of sharp and stabbing central chest pain that occurred at rest and improved with activity, including vigorous exercise.  It was noted he had previously been evaluated in the ED for similar symptoms with a mildly elevated high-sensitivity troponin with a flat trend.  EKG at the time of his office visit showed mild ST elevation in leads I and aVL with inferior and anterolateral T wave version.  Echo was performed in the office which showed normal LV systolic function without regional wall motion abnormalities or significant valvular disease.  Recommendation was made to proceed with coronary CTA.  However, prior to completion of this study he presented to [facility] in 08/2020 with an NSTEMI.  High-sensitivity troponin peaked at 2087.  LHC showed no significant CAD noted in the major arteries with 100% stenosis of the rPDA that appeared to be distal and was the likely culprit for his NSTEMI.  This occlusion was suspected to be localized plaque rupture versus vasospasm.  Normal LV systolic function with mildly elevated LVEDP.  Medical therapy was recommended.  He was seen in hospital follow-up in 09/2020 and noted an improvement in symptoms following discontinuation of vape and smoking tobacco.  He had been out of clopidogrel.  He elected to defer initiating amlodipine and atorvastatin.  He was last seen in the office in 02/2021 and was started on  ezetimibe.  Since I last saw him, he has been seen in the ED several times for chest pain.  He was admitted to the hospital in 04/2021 with rhabdomyolysis following a run.  He was most recently seen in the ED on 05/19/2021 with chest pain with a troponin of 17 and a delta of 19 which was down from his troponin levels during his admission for rhabdomyolysis.  D-dimer negative.  CTA of the chest was negative for PE.  Left upper extremity ultrasound was positive for superficial thrombus involving the left basilic vein.  The ED recommended he be initiated on apixaban.  He contacted our office on 11/08/2021 with a 3 to 4-day history of dyspnea and chest pain.  It was recommended the patient proceed to the ED.  Chart biopsy shows no clinical further evaluation.  ***   Labs independently reviewed: 04/2021 - Hgb 13.4, PLT 204, potassium 3.7, BUN 16, serum creatinine 1.42, magnesium 2.1, albumin 3.6, AST 118, ALT 86 08/2020 - TC 167, TG 46, HDL 40, LDL 118 07/2016 - TSH normal  Past Medical History:  Diagnosis Date   CAD (coronary artery disease)    a. 07/2020 NSTEMI/Cath: LM nl, LAD nl, LCX nl, RCA nl, RPDA 100. EF 55-65% -->Med rx.   Elevated liver function tests    Essential hypertension    History of echocardiogram    a. 07/2020 Echo: EF 60-65%, no rwma, nl RV fxn. No significant valvular dzs.   Rhabdomyolysis    Tobacco abuse    Vapes nicotine containing substance  Past Surgical History:  Procedure Laterality Date   LEFT HEART CATH AND CORONARY ANGIOGRAPHY N/A 08/20/2020   Procedure: LEFT HEART CATH AND CORONARY ANGIOGRAPHY;  Surgeon: Iran Ouch, MD;  Location: [facility] INVASIVE CV LAB;  Service: Cardiovascular;  Laterality: N/A;   NECK SURGERY     tendon surgery to left hand  unk    Current Medications: No outpatient medications have been marked as taking for the 11/09/21 encounter (Appointment) with Sondra Barges, PA-C.    Allergies:   Antihistamines, diphenhydramine-type and Tylenol  [acetaminophen]   Social History   Socioeconomic History   Marital status: Married    Spouse name: Not on file   Number of children: Not on file   Years of education: Not on file   Highest education level: Not on file  Occupational History   Not on file  Tobacco Use   Smoking status: Former    Packs/day: 0.50    Types: Cigarettes   Smokeless tobacco: Never  Vaping Use   Vaping Use: Every day   Devices: 1 cartridge q 2 days  Substance and Sexual Activity   Alcohol use: No   Drug use: Yes    Types: Marijuana    Comment: occassionally   Sexual activity: Yes    Partners: Female  Other Topics Concern   Not on file  Social History Narrative   Lives locally w/ wife and children. Exercises regularly.   Social Determinants of Health   Financial Resource Strain: High Risk (07/22/2020)   Overall Financial Resource Strain (CARDIA)    Difficulty of Paying Living Expenses: Hard  Food Insecurity: Food Insecurity Present (07/22/2020)   Hunger Vital Sign    Worried About Running Out of Food in the Last Year: Sometimes true    Ran Out of Food in the Last Year: Sometimes true  Transportation Needs: No Transportation Needs (07/22/2020)   PRAPARE - Administrator, Civil Service (Medical): No    Lack of Transportation (Non-Medical): No  Physical Activity: Not on file  Stress: Not on file  Social Connections: Not on file     Family History:  The patient's family history includes Diabetes in his mother; Irregular heart beat in his mother.  ROS:   ROS   EKGs/Labs/Other Studies Reviewed:    Studies reviewed were summarized above. The additional studies were reviewed today:  2D echo 07/2020: 1. Left ventricular ejection fraction, by estimation, is 60 to 65%. The  left ventricle has normal function. The left ventricle has no regional  wall motion abnormalities. Left ventricular diastolic parameters were  normal. The average left ventricular  global longitudinal strain is  -17.2 %. The global longitudinal strain is  normal.   2. Right ventricular systolic function is normal. The right ventricular  size is normal.   3. The mitral valve is normal in structure. No evidence of mitral valve  regurgitation. No evidence of mitral stenosis. __________   LHC 08/2020: RPDA lesion is 100% stenosed. The left ventricular systolic function is normal. LV end diastolic pressure is mildly elevated. The left ventricular ejection fraction is 55-65% by visual estimate.   1.  No significant coronary artery disease noted in the major arteries.  The right PDA seems to be occluded distally.  This is the likely culprit for non-STEMI. 2.  Normal LV systolic function mildly elevated left ventricular end-diastolic pressure.   Recommendations: The patient's coronary arteries are overall normal except the distal portion of the right PDA.  Suspect localized  plaque rupture or vasospasm leading to occlusion.  Recommend medical therapy. Will place the patient on clopidogrel for a year. Continue treatment with a statin. I switched metoprolol to amlodipine. I recommended that he quit tobacco use. Possible discharge home tomorrow if he remains stable.   EKG:  EKG is ordered today.  The EKG ordered today demonstrates ***  Recent Labs: 05/13/2021: ALT 86; Magnesium 2.1 05/19/2021: BUN 16; Creatinine, Ser 1.42; Hemoglobin 13.4; Platelets 204; Potassium 3.7; Sodium 131  Recent Lipid Panel    Component Value Date/Time   CHOL 167 08/20/2020 0645   TRIG 46 08/20/2020 0645   HDL 40 (L) 08/20/2020 0645   CHOLHDL 4.2 08/20/2020 0645   VLDL 9 08/20/2020 0645   LDLCALC 118 (H) 08/20/2020 0645    PHYSICAL EXAM:    VS:  There were no vitals taken for this visit.  BMI: There is no height or weight on file to calculate BMI.  Physical Exam  Wt Readings from Last 3 Encounters:  05/19/21 233 lb (105.7 kg)  05/12/21 231 lb (104.8 kg)  05/05/21 230 lb (104.3 kg)     ASSESSMENT & PLAN:    CAD involving the native coronary arteries with***:  HTN: Blood pressure  HLD: LDL 118 in 08/2020.  Elevated LFTs:  Tobacco/marijuana use:   {Are you ordering a CV Procedure (e.g. stress test, cath, DCCV, TEE, etc)?   Press F2        :[id number]}     Disposition: F/u with Dr. Okey Dupre or an APP in ***.   Medication Adjustments/Labs and Tests Ordered: Current medicines are reviewed at length with the patient today.  Concerns regarding medicines are outlined above. Medication changes, Labs and Tests ordered today are summarized above and listed in the Patient Instructions accessible in Encounters.   Signed, Eula Listen, PA-C 11/09/2021 6:51 AM     [facility] [place] [address] [place], [place] [address] [phone]

## 2021-11-09 NOTE — Progress Notes (Deleted)
Cardiology Office Note    Date:  11/09/2021   ID:  Benjamin Beltran, DOB 1985/10/30, MRN [id number]  PCP:  Patient, No Pcp Per  Cardiologist:  Nelva Bush, MD  Electrophysiologist:  None   Chief Complaint: ***  History of Present Illness:   Benjamin Beltran is a 36 y.o. male with history of CAD with NSTEMI in 08/2020 medically managed as outlined below with recurrent chest pain, hypertension, superficial left basilic vein thrombus, tobacco/vape use, marijuana use, and abnormal LFTs who presents for ***.  He was evaluated in our office in 07/2020 with history of sharp and stabbing central chest pain that occurred at rest and improved with activity, including vigorous exercise.  It was noted he had previously been evaluated in the ED for similar symptoms with a mildly elevated high-sensitivity troponin with a flat trend.  EKG at the time of his office visit showed mild ST elevation in leads I and aVL with inferior and anterolateral T wave version.  Echo was performed in the office which showed normal LV systolic function without regional wall motion abnormalities or significant valvular disease.  Recommendation was made to proceed with coronary CTA.  However, prior to completion of this study he presented to [facility] in 08/2020 with an NSTEMI.  High-sensitivity troponin peaked at 2087.  LHC showed no significant CAD noted in the major arteries with 100% stenosis of the rPDA that appeared to be distal and was the likely culprit for his NSTEMI.  This occlusion was suspected to be localized plaque rupture versus vasospasm.  Normal LV systolic function with mildly elevated LVEDP.  Medical therapy was recommended.  He was seen in hospital follow-up in 09/2020 and noted an improvement in symptoms following discontinuation of vape and smoking tobacco.  He had been out of clopidogrel.  He elected to defer initiating amlodipine and atorvastatin.  He was last seen in the office in 02/2021 and was started on  ezetimibe.  Since I last saw him, he has been seen in the ED several times for chest pain.  He was admitted to the hospital in 04/2021 with rhabdomyolysis following a run.  He was most recently seen in the ED on 05/19/2021 with chest pain with a troponin of 17 and a delta of 19 which was down from his troponin levels during his admission for rhabdomyolysis.  D-dimer negative.  CTA of the chest was negative for PE.  Left upper extremity ultrasound was positive for superficial thrombus involving the left basilic vein.  The ED recommended he be initiated on apixaban.  He contacted our office on 11/08/2021 with a 3 to 4-day history of dyspnea and chest pain.  It was recommended the patient proceed to the ED.  Chart biopsy shows no clinical further evaluation.  ***   Labs independently reviewed: 04/2021 - Hgb 13.4, PLT 204, potassium 3.7, BUN 16, serum creatinine 1.42, magnesium 2.1, albumin 3.6, AST 118, ALT 86 08/2020 - TC 167, TG 46, HDL 40, LDL 118 07/2016 - TSH normal    Past Medical History:  Diagnosis Date   CAD (coronary artery disease)    a. 07/2020 NSTEMI/Cath: LM nl, LAD nl, LCX nl, RCA nl, RPDA 100. EF 55-65% -->Med rx.   Elevated liver function tests    Essential hypertension    History of echocardiogram    a. 07/2020 Echo: EF 60-65%, no rwma, nl RV fxn. No significant valvular dzs.   Rhabdomyolysis    Tobacco abuse    Vapes nicotine containing  substance     Past Surgical History:  Procedure Laterality Date   LEFT HEART CATH AND CORONARY ANGIOGRAPHY N/A 08/20/2020   Procedure: LEFT HEART CATH AND CORONARY ANGIOGRAPHY;  Surgeon: Wellington Hampshire, MD;  Location: [place] [facility];  Service: Cardiovascular;  Laterality: N/A;   NECK SURGERY     tendon surgery to left hand  unk    Current Medications: No outpatient medications have been marked as taking for the 11/10/21 encounter (Appointment) with Rise Mu, PA-C.    Allergies:   Antihistamines, diphenhydramine-type and  Tylenol [acetaminophen]   Social History   Socioeconomic History   Marital status: Married    Spouse name: Not on file   Number of children: Not on file   Years of education: Not on file   Highest education level: Not on file  Occupational History   Not on file  Tobacco Use   Smoking status: Former    Packs/day: 0.50    Types: Cigarettes   Smokeless tobacco: Never  Vaping Use   Vaping Use: Every day   Devices: 1 cartridge q 2 days  Substance and Sexual Activity   Alcohol use: No   Drug use: Yes    Types: Marijuana    Comment: occassionally   Sexual activity: Yes    Partners: Female  Other Topics Concern   Not on file  Social History Narrative   Lives locally w/ wife and children. Exercises regularly.   Social Determinants of Health   Financial Resource Strain: High Risk (07/22/2020)   Overall Financial Resource Strain (CARDIA)    Difficulty of Paying Living Expenses: Hard  Food Insecurity: Food Insecurity Present (07/22/2020)   Hunger Vital Sign    Worried About Running Out of Food in the Last Year: Sometimes true    Ran Out of Food in the Last Year: Sometimes true  Transportation Needs: No Transportation Needs (07/22/2020)   PRAPARE - Hydrologist (Medical): No    Lack of Transportation (Non-Medical): No  Physical Activity: Not on file  Stress: Not on file  Social Connections: Not on file     Family History:  The patient's family history includes Diabetes in his mother; Irregular heart beat in his mother.  ROS:   ROS   EKGs/Labs/Other Studies Reviewed:    Studies reviewed were summarized above. The additional studies were reviewed today:  2D echo 07/2020: 1. Left ventricular ejection fraction, by estimation, is 60 to 65%. The  left ventricle has normal function. The left ventricle has no regional  wall motion abnormalities. Left ventricular diastolic parameters were  normal. The average left ventricular  global longitudinal  strain is -17.2 %. The global longitudinal strain is  normal.   2. Right ventricular systolic function is normal. The right ventricular  size is normal.   3. The mitral valve is normal in structure. No evidence of mitral valve  regurgitation. No evidence of mitral stenosis. __________   LHC 08/2020: RPDA lesion is 100% stenosed. The left ventricular systolic function is normal. LV end diastolic pressure is mildly elevated. The left ventricular ejection fraction is 55-65% by visual estimate.   1.  No significant coronary artery disease noted in the major arteries.  The right PDA seems to be occluded distally.  This is the likely culprit for non-STEMI. 2.  Normal LV systolic function mildly elevated left ventricular end-diastolic pressure.   Recommendations: The patient's coronary arteries are overall normal except the distal portion of the  right PDA.  Suspect localized plaque rupture or vasospasm leading to occlusion.  Recommend medical therapy. Will place the patient on clopidogrel for a year. Continue treatment with a statin. I switched metoprolol to amlodipine. I recommended that he quit tobacco use. Possible discharge home tomorrow if he remains stable.   EKG:  EKG is ordered today.  The EKG ordered today demonstrates ***  Recent Labs: 05/13/2021: ALT 86; Magnesium 2.1 05/19/2021: BUN 16; Creatinine, Ser 1.42; Hemoglobin 13.4; Platelets 204; Potassium 3.7; Sodium 131  Recent Lipid Panel    Component Value Date/Time   CHOL 167 08/20/2020 0645   TRIG 46 08/20/2020 0645   HDL 40 (L) 08/20/2020 0645   CHOLHDL 4.2 08/20/2020 0645   VLDL 9 08/20/2020 0645   LDLCALC 118 (H) 08/20/2020 0645    PHYSICAL EXAM:    VS:  There were no vitals taken for this visit.  BMI: There is no height or weight on file to calculate BMI.  Physical Exam  Wt Readings from Last 3 Encounters:  05/19/21 233 lb (105.7 kg)  05/12/21 231 lb (104.8 kg)  05/05/21 230 lb (104.3 kg)     ASSESSMENT &  PLAN:   CAD involving the native coronary arteries with***:  HTN: Blood pressure  HLD: LDL 118 in 08/2020.  Elevated LFTs:  Tobacco/marijuana use:   {Are you ordering a CV Procedure (e.g. stress test, cath, DCCV, TEE, etc)?   Press F2        :[id number]     Disposition: F/u with Dr. Saunders Revel or an APP in ***.   Medication Adjustments/Labs and Tests Ordered: Current medicines are reviewed at length with the patient today.  Concerns regarding medicines are outlined above. Medication changes, Labs and Tests ordered today are summarized above and listed in the Patient Instructions accessible in Encounters.   Signed, Christell Faith, PA-C 11/09/2021 9:10 AM     [place], [place] [address] [phone]

## 2021-11-10 ENCOUNTER — Ambulatory Visit: Payer: Medicaid Other | Attending: Physician Assistant | Admitting: Physician Assistant

## 2021-11-30 ENCOUNTER — Encounter: Payer: Self-pay | Admitting: Internal Medicine

## 2021-11-30 ENCOUNTER — Ambulatory Visit: Payer: Self-pay | Attending: Internal Medicine | Admitting: Internal Medicine

## 2021-11-30 NOTE — Progress Notes (Deleted)
Follow-up Outpatient Visit Date: 11/30/2021  Primary Care Provider: Patient, No Pcp Per No address on file  Chief Complaint: ***  HPI:  Mr. Benjamin Beltran is a 36 y.o. male with history of coronary artery disease with medically managed NSTEMI in 08/2020 due to occlusion of branch of the RPDA, hypertension, and polysubstance abuse (tobacco and marijuana), who presents for follow-up of coronary artery disease.  He was last seen in the office in 02/2021 by Ginger Organ, PA, at which time he noted chest tightness 1 to 2 weeks before the visit.  Chest pain was usually at rest.  He was asked to start ezetimibe 10 mg daily and lieu of statin therapy due to mild transaminitis in the past.  --------------------------------------------------------------------------------------------------  Past Medical History:  Diagnosis Date   CAD (coronary artery disease)    a. 07/2020 NSTEMI/Cath: LM nl, LAD nl, LCX nl, RCA nl, RPDA 100. EF 55-65% -->Med rx.   Elevated liver function tests    Essential hypertension    History of echocardiogram    a. 07/2020 Echo: EF 60-65%, no rwma, nl RV fxn. No significant valvular dzs.   Rhabdomyolysis    Tobacco abuse    Vapes nicotine containing substance    Past Surgical History:  Procedure Laterality Date   LEFT HEART CATH AND CORONARY ANGIOGRAPHY N/A 08/20/2020   Procedure: LEFT HEART CATH AND CORONARY ANGIOGRAPHY;  Surgeon: Wellington Hampshire, MD;  Location: [place] [facility];  Service: Cardiovascular;  Laterality: N/A;   NECK SURGERY     tendon surgery to left hand  unk    No outpatient medications have been marked as taking for the 11/30/21 encounter (Appointment) with Kelby Lotspeich, Benjamin Gave, MD.    Allergies: Antihistamines, diphenhydramine-type and Tylenol [acetaminophen]  Social History   Tobacco Use   Smoking status: Former    Packs/day: 0.50    Types: Cigarettes   Smokeless tobacco: Never  Vaping Use   Vaping Use: Every day   Devices: 1 cartridge q 2  days  Substance Use Topics   Alcohol use: No   Drug use: Yes    Types: Marijuana    Comment: occassionally    Family History  Problem Relation Age of Onset   Diabetes Mother    Irregular heart beat Mother     Review of Systems: A 12-system review of systems was performed and was negative except as noted in the HPI.  --------------------------------------------------------------------------------------------------  Physical Exam: There were no vitals taken for this visit.  General:  NAD. Neck: No JVD or HJR. Lungs: Clear to auscultation bilaterally without wheezes or crackles. Heart: Regular rate and rhythm without murmurs, rubs, or gallops. Abdomen: Soft, nontender, nondistended. Extremities: No lower extremity edema.  EKG:  ***  Lab Results  Component Value Date   WBC 4.3 05/19/2021   HGB 13.4 05/19/2021   HCT 42.4 05/19/2021   MCV 87.2 05/19/2021   PLT 204 05/19/2021    Lab Results  Component Value Date   NA 131 (L) 05/19/2021   K 3.7 05/19/2021   CL 101 05/19/2021   CO2 24 05/19/2021   BUN 16 05/19/2021   CREATININE 1.42 (H) 05/19/2021   GLUCOSE 150 (H) 05/19/2021   ALT 86 (H) 05/13/2021    Lab Results  Component Value Date   CHOL 167 08/20/2020   HDL 40 (L) 08/20/2020   LDLCALC 118 (H) 08/20/2020   TRIG 46 08/20/2020   CHOLHDL 4.2 08/20/2020    --------------------------------------------------------------------------------------------------  ASSESSMENT AND PLAN: Benjamin Beltran  Benjamin Pettis, MD 11/30/2021 1:33 PM

## 2021-12-07 ENCOUNTER — Emergency Department: Payer: Medicaid Other

## 2021-12-07 ENCOUNTER — Other Ambulatory Visit: Payer: Self-pay

## 2021-12-07 ENCOUNTER — Emergency Department
Admission: EM | Admit: 2021-12-07 | Discharge: 2021-12-07 | Disposition: A | Discharge status: Home / Self Care | Payer: Medicaid Other | Attending: Emergency Medicine | Admitting: Emergency Medicine

## 2021-12-07 DIAGNOSIS — R002 Palpitations: Secondary | ICD-10-CM | POA: Insufficient documentation

## 2021-12-07 DIAGNOSIS — R7401 Elevation of levels of liver transaminase levels: Secondary | ICD-10-CM | POA: Insufficient documentation

## 2021-12-07 DIAGNOSIS — R0789 Other chest pain: Secondary | ICD-10-CM | POA: Insufficient documentation

## 2021-12-07 DIAGNOSIS — I2510 Atherosclerotic heart disease of native coronary artery without angina pectoris: Secondary | ICD-10-CM | POA: Insufficient documentation

## 2021-12-07 DIAGNOSIS — I10 Essential (primary) hypertension: Secondary | ICD-10-CM | POA: Insufficient documentation

## 2021-12-07 LAB — BASIC METABOLIC PANEL
Anion gap: 5 (ref 5–15)
BUN: 10 mg/dL (ref 6–20)
CO2: 27 mmol/L (ref 22–32)
Calcium: 8.9 mg/dL (ref 8.9–10.3)
Chloride: 107 mmol/L (ref 98–111)
Creatinine, Ser: 1.13 mg/dL (ref 0.61–1.24)
GFR, Estimated: >60 mL/min (ref >60)
Glucose, Bld: 89 mg/dL (ref 70–99)
Potassium: 3.9 mmol/L (ref 3.5–5.1)
Sodium: 139 mmol/L (ref 135–145)

## 2021-12-07 LAB — CBC
HCT: 44.1 % (ref 39.0–52.0)
Hemoglobin: 14.2 g/dL (ref 13.0–17.0)
MCH: 28.1 pg (ref 26.0–34.0)
MCHC: 32.2 g/dL (ref 30.0–36.0)
MCV: 87.2 fL (ref 80.0–100.0)
Platelets: 220 10*3/uL (ref 150–400)
RBC: 5.06 MIL/uL (ref 4.22–5.81)
RDW: 13.3 % (ref 11.5–15.5)
WBC: 6.4 10*3/uL (ref 4.0–10.5)
nRBC: 0 % (ref 0.0–0.2)

## 2021-12-07 LAB — HEPATIC FUNCTION PANEL
ALT: 60 U/L — ABNORMAL HIGH (ref 0–44)
AST: 62 U/L — ABNORMAL HIGH (ref 15–41)
Albumin: 3.9 g/dL (ref 3.5–5.0)
Alkaline Phosphatase: 54 U/L (ref 38–126)
Bilirubin, Direct: <0.1 mg/dL (ref 0.0–0.2)
Total Bilirubin: 0.8 mg/dL (ref 0.3–1.2)
Total Protein: 7 g/dL (ref 6.5–8.1)

## 2021-12-07 LAB — TROPONIN I (HIGH SENSITIVITY)
Troponin I (High Sensitivity): 15 ng/L (ref <18)
Troponin I (High Sensitivity): 12 ng/L (ref <18)

## 2021-12-07 NOTE — Discharge Instructions (Signed)
Return to the ER for new, worsening, or persistent severe chest pain, difficulty breathing, weakness or lightheadedness, or any other new or worsening symptoms that concern you. 

## 2021-12-07 NOTE — ED Provider Triage Note (Signed)
Emergency Medicine Provider Triage Evaluation Note  HALTON NEAS , a 36 y.o. male  was evaluated in triage.  Pt complains of chest pain and palpitations.  Review of Systems  Positive:  Negative:   Physical Exam  BP 135/87   Pulse 78   Temp 99.1 F (37.3 C) (Oral)   Resp 18   Ht 5\' 11"  (1.803 m)   Wt 99.8 kg   SpO2 97%   BMI 30.68 kg/m  Gen:   Awake, no distress   Resp:  Normal effort  MSK:   Moves extremities without difficulty  Other:    Medical Decision Making  Medically screening exam initiated at 2:41 PM.  Appropriate orders placed.  JONTAVIUS RABALAIS was informed that the remainder of the evaluation will be completed by another provider, this initial triage assessment does not replace that evaluation, and the importance of remaining in the ED until their evaluation is complete.  Chest pain protocols initiated by nursing staff   Versie Starks, PA-C 12/07/21 1441

## 2021-12-07 NOTE — ED Provider Notes (Signed)
[facility] Provider Note    Event Date/Time   First MD Initiated Contact with Patient 12/07/21 1715     (approximate)   History   Chest Pain (Pt. To ED via POV from home for intermittent CP, palpitations with shortness of breath x "months" with increasing severity today. Pt. Has hx of heart attack.)   HPI  Benjamin Beltran is a 36 y.o. male with a history of CAD status post NSTEMI and PCI as well as hypertension who presents with chest pain that has been going on for few months but acutely worsened today.  He describes it as sharp and in the middle of his chest.  It is intermittent and nonexertional.  He reports mild shortness of breath earlier which has resolved.  He denies any lightheadedness.  He has no nausea or vomiting.  The patient states that he believes the pain is due to him drinking heavily last night.  He states he is actually planning to go to detox after being evaluated in the ED today.  He reports marijuana use but denies cocaine or other drugs.  I reviewed the past medical records.  The patient was admitted in March and per the hospitalist discharge summary from 3/24 he was found to be in rhabdomyolysis with CK of 4997, treated with IV fluids.  He has a history of an NSTEMI in 2022 and 100% occlusion of the PDA which is being managed medically.   Physical Exam   Triage Vital Signs: ED Triage Vitals  Enc Vitals Group     BP 12/07/21 1438 135/87     Pulse Rate 12/07/21 1438 78     Resp 12/07/21 1438 18     Temp 12/07/21 1438 99.1 F (37.3 C)     Temp Source 12/07/21 1438 Oral     SpO2 12/07/21 1438 97 %     Weight 12/07/21 1436 220 lb (99.8 kg)     Height 12/07/21 1436 5\' 11"  (1.803 m)     Head Circumference --      Peak Flow --      Pain Score 12/07/21 1439 8     Pain Loc --      Pain Edu? --      Excl. in GC? --     Most recent vital signs: Vitals:   12/07/21 1438 12/07/21 1752  BP: 135/87 126/63  Pulse: 78 80  Resp: 18 16   Temp: 99.1 F (37.3 C)   SpO2: 97% 98%     General: Awake, no distress.  CV:  Good peripheral perfusion.  Normal heart sounds. Resp:  Normal effort.  Lungs CTAB. Abd:  No distention.  Other:  No calf or popliteal swelling or tenderness.   ED Results / Procedures / Treatments   Labs (all labs ordered are listed, but only abnormal results are displayed) Labs Reviewed  HEPATIC FUNCTION PANEL - Abnormal; Notable for the following components:      Result Value   AST 62 (*)    ALT 60 (*)    All other components within normal limits  BASIC METABOLIC PANEL  CBC  TROPONIN I (HIGH SENSITIVITY)  TROPONIN I (HIGH SENSITIVITY)     EKG  ED ECG REPORT I, 12/09/21, the attending physician, personally viewed and interpreted this ECG.  Date: 12/07/2021 EKG Time: 1435 Rate: 77 Rhythm: normal sinus rhythm QRS Axis: normal Intervals: normal ST/T Wave abnormalities: Nonspecific ST abnormalities Narrative Interpretation: Nonspecific abnormalities with no evidence of acute  ischemia; no significant change when compared to EKG of 05/19/2021    RADIOLOGY  Chest x-ray: I independently viewed and interpreted the images; there is no focal consolidation or edema  PROCEDURES:  Critical Care performed: No  Procedures   MEDICATIONS ORDERED IN ED: Medications - No data to display   IMPRESSION / MDM / [place] / ED COURSE  I reviewed the triage vital signs and the nursing notes.  36 year old male with history of CAD and other PMH as noted above presents with atypical nonexertional chest pain today after drinking heavily last night.  Physical exam is unremarkable.  Differential diagnosis includes, but is not limited to, ACS, musculoskeletal chest wall pain, nerve pain, GERD, gastritis.  MP and CBC are unremarkable.  Initial troponin is negative.  We will obtain a repeat troponin after 2 hours given the patient's increased cardiac risk.  He is also concerned about  his LFTs given his heavy drinking is have added on a hepatic function panel.  If these are negative I anticipate discharge home with cardiology follow-up.  Patient's presentation is most consistent with acute presentation with potential threat to life or bodily function.  The patient is on the cardiac monitor to evaluate for evidence of arrhythmia and/or significant heart rate changes.  ----------------------------------------- 6:54 PM on 12/07/2021 -----------------------------------------  Repeat troponin is negative.  LFTs showed minimal elevation of AST and ALT, improved from prior.  On reassessment the patient appears comfortable and is having no active chest pain.  He is stable for discharge.  There is no clinical evidence for alcohol withdrawal.  I have given him detox referral.  Return precautions provided, and he expresses understanding.   FINAL CLINICAL IMPRESSION(S) / ED DIAGNOSES   Final diagnoses:  Atypical chest pain     Rx / DC Orders   ED Discharge Orders          Ordered    Ambulatory referral to Cardiology       Comments: If you have not heard from the Cardiology office within the next 72 hours please call [phone].   12/07/21 1852             Note:  This document was prepared using Dragon voice recognition software and may include unintentional dictation errors.    Arta Silence, MD 12/07/21 [phone]

## 2021-12-07 NOTE — ED Triage Notes (Signed)
Pt. To ED via POV from home for intermittent CP, palpitations with shortness of breath x "months" with increasing severity today. Pt. Has hx of heart attack.

## 2022-01-20 ENCOUNTER — Encounter: Payer: Self-pay | Admitting: Emergency Medicine

## 2022-01-20 ENCOUNTER — Emergency Department
Admission: EM | Admit: 2022-01-20 | Discharge: 2022-01-20 | Disposition: A | Discharge status: Home / Self Care | Attending: Emergency Medicine | Admitting: Emergency Medicine

## 2022-01-20 ENCOUNTER — Other Ambulatory Visit: Payer: Self-pay

## 2022-01-20 ENCOUNTER — Emergency Department: Payer: 59

## 2022-01-20 DIAGNOSIS — S300XXA Contusion of lower back and pelvis, initial encounter: Secondary | ICD-10-CM | POA: Insufficient documentation

## 2022-01-20 DIAGNOSIS — S3992XA Unspecified injury of lower back, initial encounter: Secondary | ICD-10-CM | POA: Diagnosis present

## 2022-01-20 DIAGNOSIS — W228XXA Striking against or struck by other objects, initial encounter: Secondary | ICD-10-CM | POA: Diagnosis not present

## 2022-01-20 NOTE — ED Triage Notes (Signed)
Pt comes with c/o back pain and injury while at work. Pt states mid to lower back pain. Pt states this happened few days ago. Pt went to [facility] and was evaluated.

## 2022-01-20 NOTE — ED Provider Notes (Signed)
[facility] Provider Note    Event Date/Time   First MD Initiated Contact with Patient 01/20/22 [phone]     (approximate)   History   Back Pain   HPI  Benjamin Beltran is a 36 y.o. male with no significant past medical history presents emergency department complaining of low back pain.  Patient states he had a Worker's Comp. injury 2 days ago.  States he fell back landing on his lower back against a steel beam.  Complaining of tailbone pain.  States he was seen and evaluated but they did not do x-rays.  States he is concerned as he continues to have a lot of pain.  No numbness or tingling.  No blood in stool      Physical Exam   Triage Vital Signs: ED Triage Vitals  Enc Vitals Group     BP 01/20/22 0744 133/79     Pulse Rate 01/20/22 0744 90     Resp 01/20/22 0744 20     Temp 01/20/22 0744 98.4 F (36.9 C)     Temp src --      SpO2 01/20/22 0744 97 %     Weight 01/20/22 0806 220 lb 0.3 oz (99.8 kg)     Height 01/20/22 0806 5\' 11"  (1.803 m)     Head Circumference --      Peak Flow --      Pain Score 01/20/22 0737 8     Pain Loc --      Pain Edu? --      Excl. in GC? --     Most recent vital signs: Vitals:   01/20/22 0744  BP: 133/79  Pulse: 90  Resp: 20  Temp: 98.4 F (36.9 C)  SpO2: 97%     General: Awake, no distress.   CV:  Good peripheral perfusion. regular rate and  rhythm Resp:  Normal effort.  Abd:  No distention.   Other:  Sacrum and coccyx tender to palpation, 5 or 5 strength lower extremities, neurovascular is intact   ED Results / Procedures / Treatments   Labs (all labs ordered are listed, but only abnormal results are displayed) Labs Reviewed - No data to display   EKG     RADIOLOGY X-ray sacrum and coccyx    PROCEDURES:   Procedures   MEDICATIONS ORDERED IN ED: Medications - No data to display   IMPRESSION / MDM / ASSESSMENT AND PLAN / ED COURSE  I reviewed the triage vital signs and the  nursing notes.                              Differential diagnosis includes, but is not limited to, fracture, contusion, strain  Patient's presentation is most consistent with acute complicated illness / injury requiring diagnostic workup.   X-ray of the lumbar spine and coccyx independently reviewed and interpreted by me as being negative for any acute abnormality  I did explain the findings to the patient.  Told that he is just bruised on the tailbone.  Apply ice.  Patient would like for me to change his Worker's Comp. restrictions but I will still defer that to the 14/01/23. clinic.  He was given a work note stating he was here today.  He is to follow-up at the [facility] for further evaluation.  He was discharged stable condition.      FINAL CLINICAL IMPRESSION(S) / ED DIAGNOSES  Final diagnoses:  Lumbar contusion, initial encounter  Contusion of coccyx, initial encounter     Rx / DC Orders   ED Discharge Orders     None        Note:  This document was prepared using Dragon voice recognition software and may include unintentional dictation errors.    Faythe Ghee, PA-C 01/20/22 3009    Jene Every, MD 01/20/22 [phone]

## 2022-01-20 NOTE — ED Notes (Signed)
See triage note  Presents with lower back pain  States he had a fall 2 days ago  Landed on his back  Was seen at the time of the injury  States he did not have any x-rays  Cont's to have pain  ambulates well to treatment room

## 2022-01-20 NOTE — Discharge Instructions (Signed)
Follow-up with [facility] if not improving in 2 to 3 days.  Return if worsening.  I will defer your Worker's Comp. restrictions to [facility] as they are a occupational work [employer].  Please stop by there to pick up your note.  Apply ice to the lower back.

## 2022-03-13 NOTE — Progress Notes (Deleted)
Cardiology Office Note    Date:  03/13/2022   ID:  Benjamin Beltran, DOB 1985-12-22, MRN [id number]  PCP:  Patient, No Pcp Per  Cardiologist:  Nelva Bush, MD  Electrophysiologist:  None   Chief Complaint: Follow-up  History of Present Illness:   Benjamin Beltran is a 37 y.o. male with history of CAD with NSTEMI in 08/2020 medically managed as outlined below, hypertension, tobacco/vape use, occasional marijuana use, abnormal LFTs, and chest pain who presents for follow-up of CAD.  He was evaluated in our office in 07/2020 with history of sharp and stabbing central chest pain that occurred at rest and improved with activity, including vigorous exercise.  It was noted he had previously been evaluated in the ED for similar symptoms with a mildly elevated high-sensitivity troponin with a flat trend of 40.  EKG at the time of his office visit showed mild ST elevation in leads I and aVL with inferior and anterolateral T wave version.  Echo was performed in the office which showed normal LV systolic function without regional wall motion abnormalities or significant valvular disease.  Recommendation was made to proceed with coronary CTA.  However, prior to completion of this study he presented to [facility] on 08/20/20 with an NSTEMI.  High-sensitivity troponin peaked at 2087.  LHC showed no significant CAD noted in the major arteries with 100% stenosis of the rPDA that appeared to be distal and was the likely culprit for his NSTEMI.  This occlusion was suspected to be localized plaque rupture versus vasospasm.  Normal LV systolic function with mildly elevated LVEDP.  Medical therapy was recommended.  Since that admission, he has been seen in the ED multiple times for chest pain including in 04/2021 with mildly elevated high-sensitivity troponin.  CTA chest negative for PE.  He was later admitted to the hospital in 04/2021 with rhabdomyolysis with an initial CK of 4900 treated with IV fluids.  It was recommended he  hold atorvastatin and ezetimibe.  He left AMA.  He was seen in the ED on 12/07/2021 with chest pain with high-sensitivity troponin negative x 2.  He was seen at outside ED on 12/09/2021 with chest pain again with high-sensitivity troponin negative x 2.  Since he was last seen in 09/2020, he has had several no-show/cancellation appointments.  ***   Labs independently reviewed: 11/2021 - potassium 3.7, BUN 8, serum creatinine 1.2, albumin 3.5, AST 45, ALT 55, Hgb 14.0, PLT 216 04/2021 - magnesium 2.1 08/2020 - TC 167, TG 46, HDL 40, LDL 118  Past Medical History:  Diagnosis Date   CAD (coronary artery disease)    a. 07/2020 NSTEMI/Cath: LM nl, LAD nl, LCX nl, RCA nl, RPDA 100. EF 55-65% -->Med rx.   Elevated liver function tests    Essential hypertension    History of echocardiogram    a. 07/2020 Echo: EF 60-65%, no rwma, nl RV fxn. No significant valvular dzs.   Rhabdomyolysis    Tobacco abuse    Vapes nicotine containing substance     Past Surgical History:  Procedure Laterality Date   LEFT HEART CATH AND CORONARY ANGIOGRAPHY N/A 08/20/2020   Procedure: LEFT HEART CATH AND CORONARY ANGIOGRAPHY;  Surgeon: Wellington Hampshire, MD;  Location: [facility];  Service: Cardiovascular;  Laterality: N/A;   NECK SURGERY     tendon surgery to left hand  unk    Current Medications: No outpatient medications have been marked as taking for the 03/14/22 encounter (Appointment) with  Rise Mu, PA-C.    Allergies:   Antihistamines, diphenhydramine-type and Tylenol [acetaminophen]   Social History   Socioeconomic History   Marital status: Married    Spouse name: Not on file   Number of children: Not on file   Years of education: Not on file   Highest education level: Not on file  Occupational History   Not on file  Tobacco Use   Smoking status: Former    Packs/day: 0.50    Types: Cigarettes   Smokeless tobacco: Never  Vaping Use   Vaping Use: Every day   Devices: 1 cartridge q  2 days  Substance and Sexual Activity   Alcohol use: No   Drug use: Yes    Types: Marijuana    Comment: occassionally   Sexual activity: Yes    Partners: Female  Other Topics Concern   Not on file  Social History Narrative   Lives locally w/ wife and children. Exercises regularly.   Social Determinants of Health   Financial Resource Strain: High Risk (07/22/2020)   Overall Financial Resource Strain (CARDIA)    Difficulty of Paying Living Expenses: Hard  Food Insecurity: Food Insecurity Present (07/22/2020)   Hunger Vital Sign    Worried About Running Out of Food in the Last Year: Sometimes true    Ran Out of Food in the Last Year: Sometimes true  Transportation Needs: No Transportation Needs (07/22/2020)   PRAPARE - Hydrologist (Medical): No    Lack of Transportation (Non-Medical): No  Physical Activity: Not on file  Stress: Not on file  Social Connections: Not on file     Family History:  The patient's family history includes Diabetes in his mother; Irregular heart beat in his mother.  ROS:   12-point review of systems is negative unless otherwise noted in the HPI.   EKGs/Labs/Other Studies Reviewed:    Studies reviewed were summarized above. The additional studies were reviewed today:  LHC 08/20/2020: RPDA lesion is 100% stenosed. The left ventricular systolic function is normal. LV end diastolic pressure is mildly elevated. The left ventricular ejection fraction is 55-65% by visual estimate.   1.  No significant coronary artery disease noted in the major arteries.  The right PDA seems to be occluded distally.  This is the likely culprit for non-STEMI. 2.  Normal LV systolic function mildly elevated left ventricular end-diastolic pressure.   Recommendations: The patient's coronary arteries are overall normal except the distal portion of the right PDA.  Suspect localized plaque rupture or vasospasm leading to occlusion.  Recommend medical  therapy. Will place the patient on clopidogrel for a year. Continue treatment with a statin. I switched metoprolol to amlodipine. I recommended that he quit tobacco use. Possible discharge home tomorrow if he remains stable. __________  2D echo 07/21/2020: 1. Left ventricular ejection fraction, by estimation, is 60 to 65%. The  left ventricle has normal function. The left ventricle has no regional  wall motion abnormalities. Left ventricular diastolic parameters were  normal. The average left ventricular  global longitudinal strain is -17.2 %. The global longitudinal strain is  normal.   2. Right ventricular systolic function is normal. The right ventricular  size is normal.   3. The mitral valve is normal in structure. No evidence of mitral valve  regurgitation. No evidence of mitral stenosis.     EKG:  EKG is ordered today.  The EKG ordered today demonstrates ***  Recent Labs: 05/13/2021: Magnesium 2.1  12/07/2021: ALT 60; BUN 10; Creatinine, Ser 1.13; Hemoglobin 14.2; Platelets 220; Potassium 3.9; Sodium 139  Recent Lipid Panel    Component Value Date/Time   CHOL 167 08/20/2020 0645   TRIG 46 08/20/2020 0645   HDL 40 (L) 08/20/2020 0645   CHOLHDL 4.2 08/20/2020 0645   VLDL 9 08/20/2020 0645   LDLCALC 118 (H) 08/20/2020 0645    PHYSICAL EXAM:    VS:  There were no vitals taken for this visit.  BMI: There is no height or weight on file to calculate BMI.  Physical Exam  Wt Readings from Last 3 Encounters:  01/20/22 220 lb 0.3 oz (99.8 kg)  12/07/21 220 lb (99.8 kg)  05/19/21 233 lb (105.7 kg)     ASSESSMENT & PLAN:   CAD involving the native coronaries with ***:  HTN: Blood pressure  HLD: LDL 118 in 08/2020, not on statin therapy at that time.  History of abnormal LFTs and rhabdomyolysis:   {Are you ordering a CV Procedure (e.g. stress test, cath, DCCV, TEE, etc)?   Press F2        :[id number]     Disposition: F/u with Dr. Saunders Revel or an APP in  ***.   Medication Adjustments/Labs and Tests Ordered: Current medicines are reviewed at length with the patient today.  Concerns regarding medicines are outlined above. Medication changes, Labs and Tests ordered today are summarized above and listed in the Patient Instructions accessible in Encounters.   Signed, Christell Faith, PA-C 03/13/2022 9:32 AM     [facility] [address] [place] [address] [place], [place] [address] [phone]

## 2022-03-14 ENCOUNTER — Encounter: Payer: Self-pay | Admitting: Physician Assistant

## 2022-03-14 ENCOUNTER — Ambulatory Visit: Payer: Medicaid Other | Attending: Physician Assistant | Admitting: Physician Assistant

## 2022-06-27 IMAGING — CR DG FOOT COMPLETE 3+V*R*
1 series · 3 of 3 positions shown · non-contrast
Comparison: None.

CLINICAL DATA: Fall great toe pain

EXAM:
RIGHT FOOT COMPLETE - 3+ VIEW

[Series 1: dg foot complete right · 0.14mm/px · 3 of 3 slices shown]
[im 1/3]
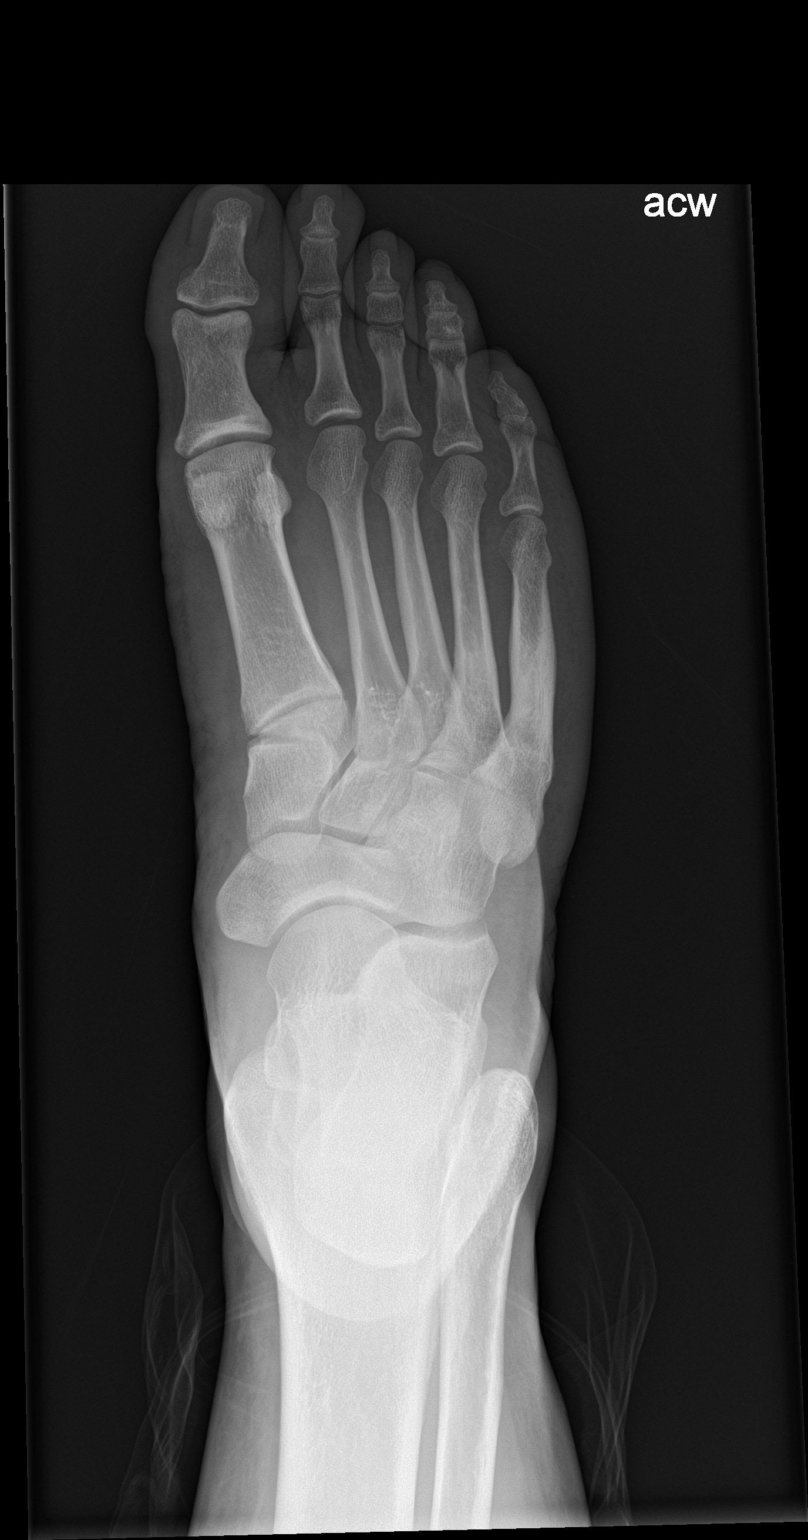
[im 2/3]
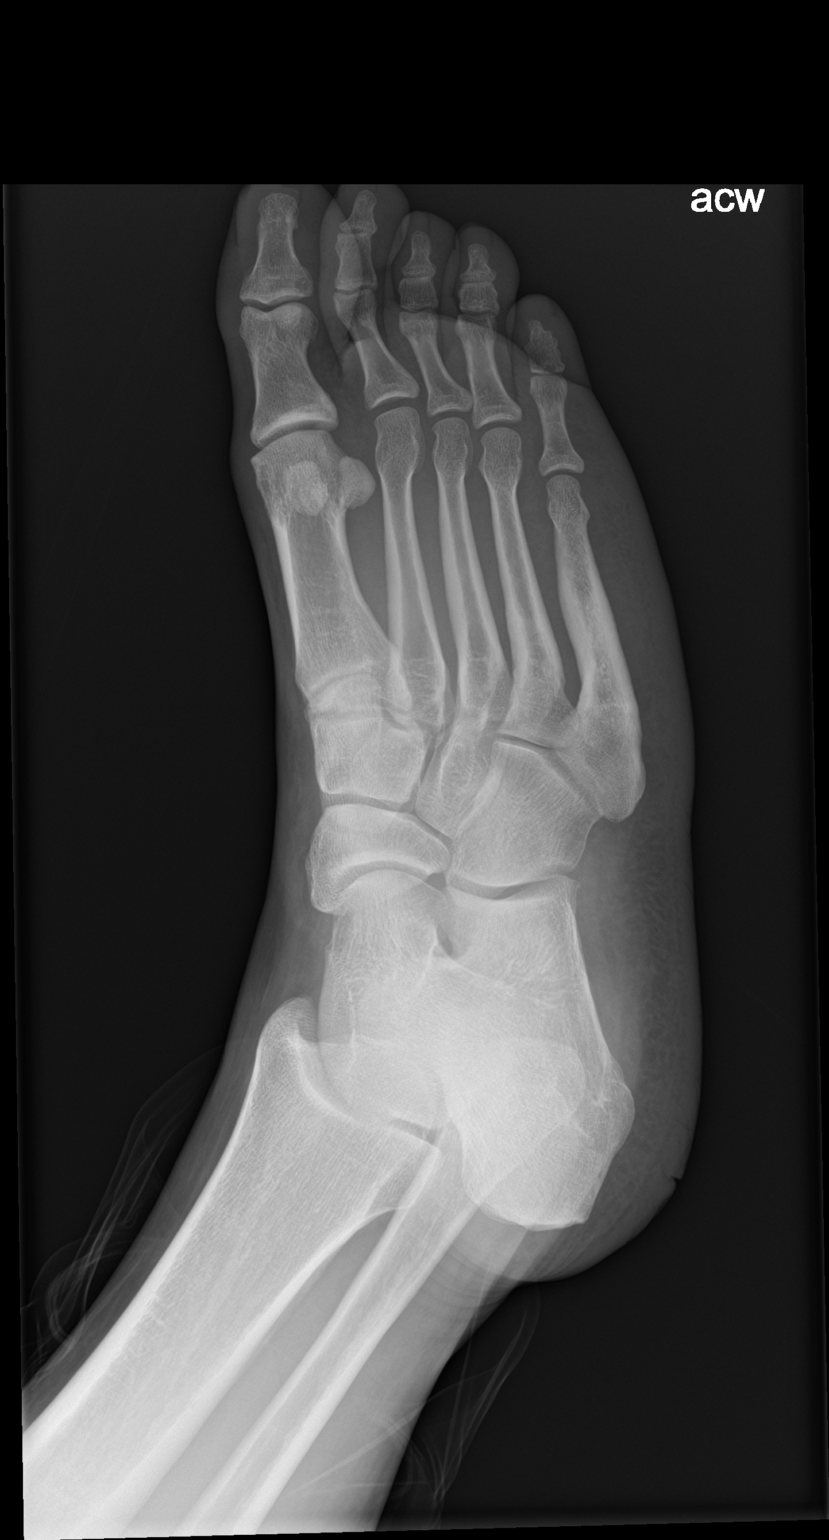
[im 3/3]
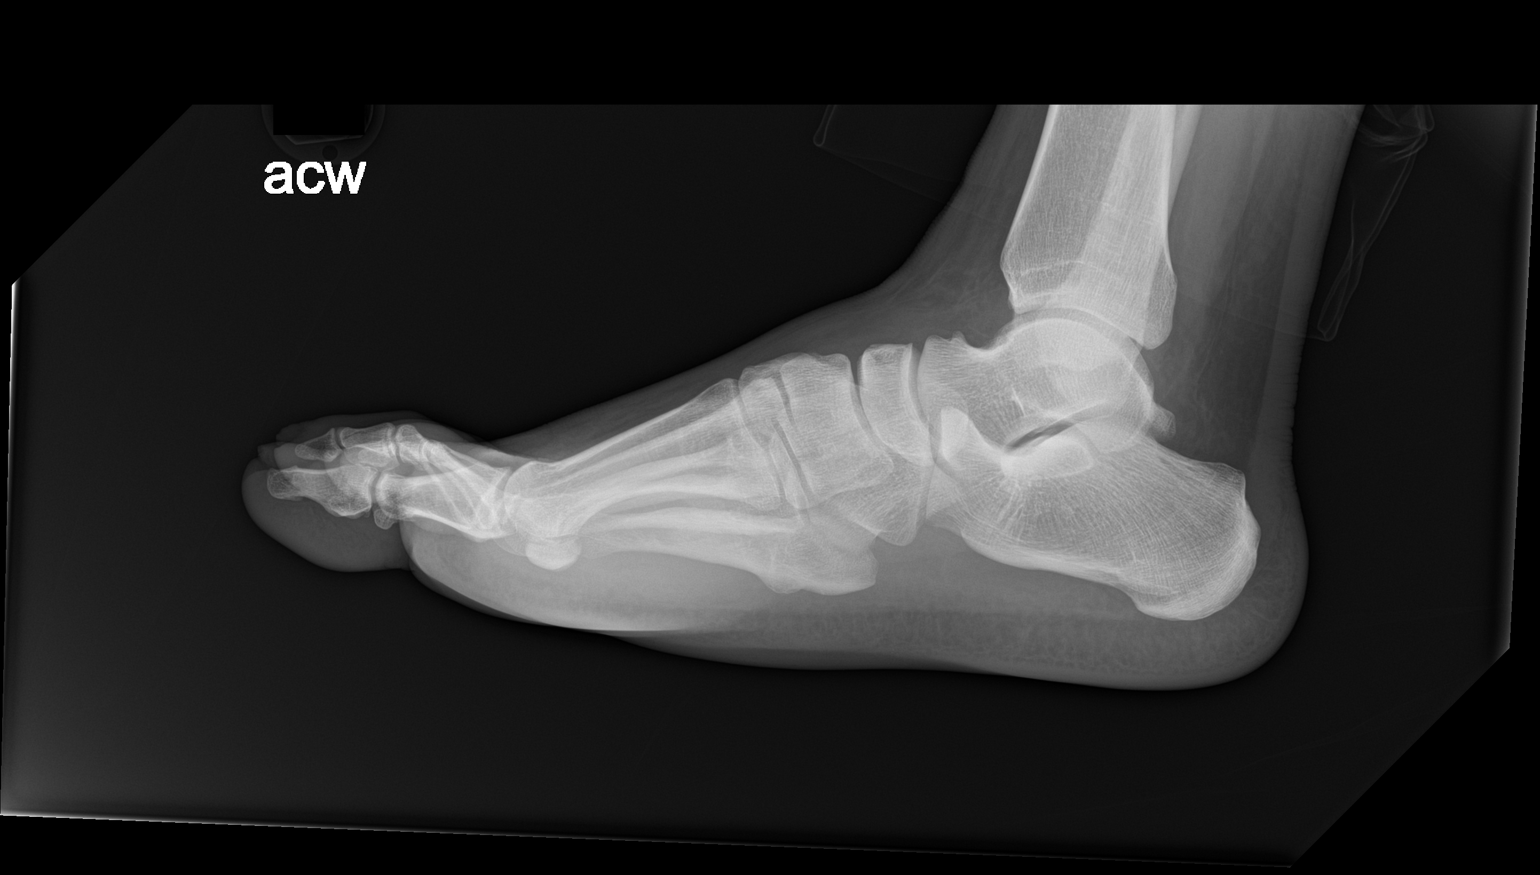

[3 of 3 positions shown; findings below may reference images not displayed]

FINDINGS: No acute displaced fracture or malalignment. Old appearing fracture
deformity of the fifth metatarsal. Joint spaces are maintained
IMPRESSION: No acute osseous abnormality.

## 2023-03-23 IMAGING — CT CT ANGIO CHEST
2 of 6 series · 19 of 46 positions shown · IV contrast (APPLIED)
Comparison: None.

CLINICAL DATA: Chest pain

EXAM:
CT ANGIOGRAPHY CHEST WITH CONTRAST
TECHNIQUE: Multidetector CT imaging of the chest was performed using the
standard protocol during bolus administration of intravenous
contrast. Multiplanar CT image reconstructions and MIPs were
obtained to evaluate the vascular anatomy.
CONTRAST:  100mL OMNIPAQUE IOHEXOL 350 MG/ML SOLN

[Series 10: thins · axial · 0.73mm/px · z∈[-500,-242]mm · 16 of 353 slices shown]
[im 15/353  lung]
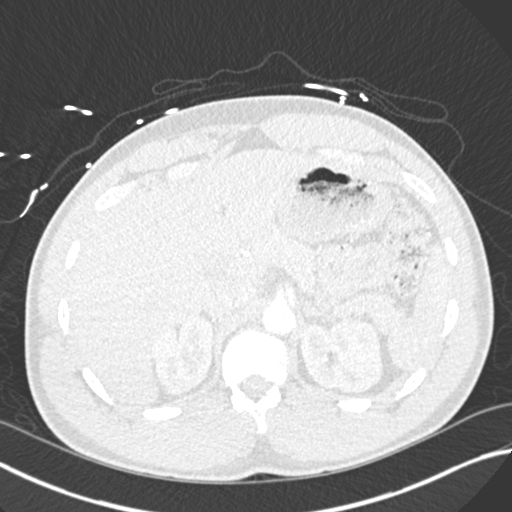
[im 45/353  soft-tissue]
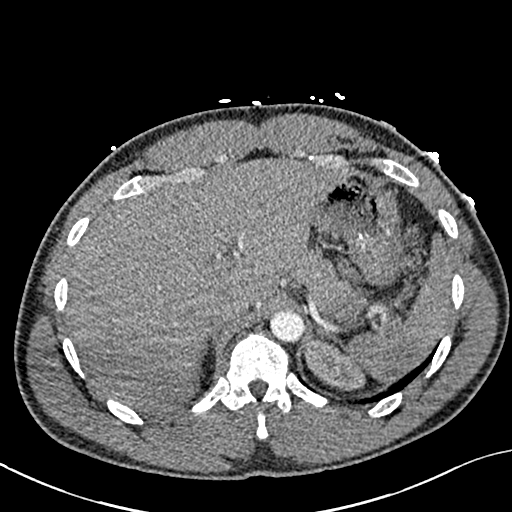
[im 59/353  lung]
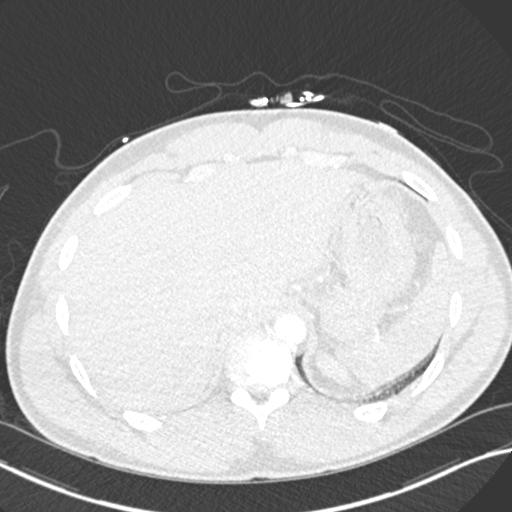
[im 89/353  soft-tissue]
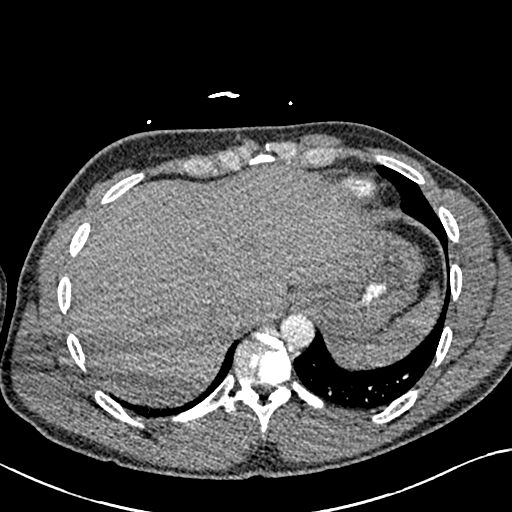
[im 103/353  lung]
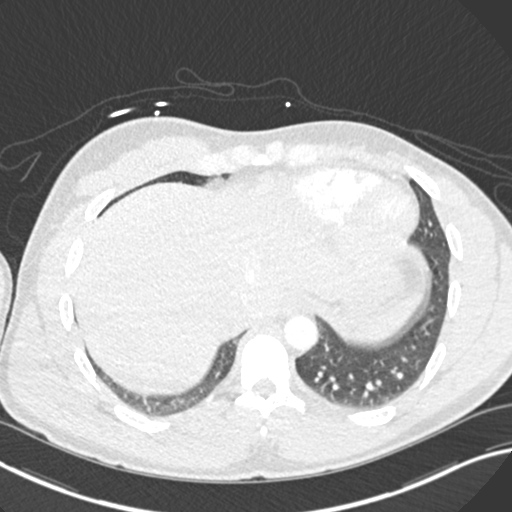
[im 118/353  soft-tissue]
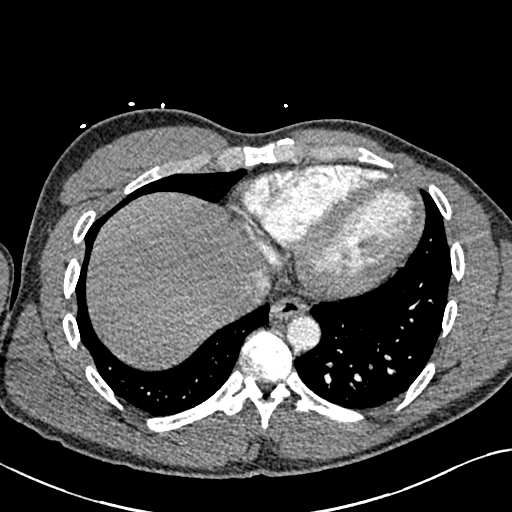
[im 147/353  lung]
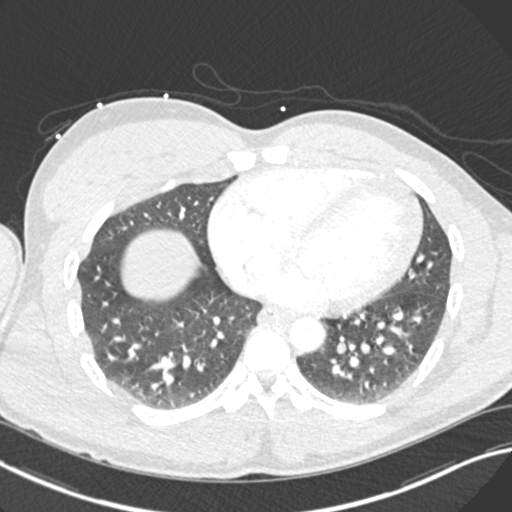
[im 162/353  soft-tissue]
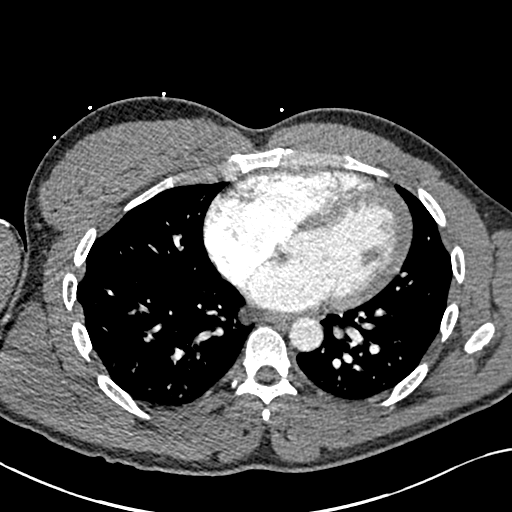
[im 191/353  lung]
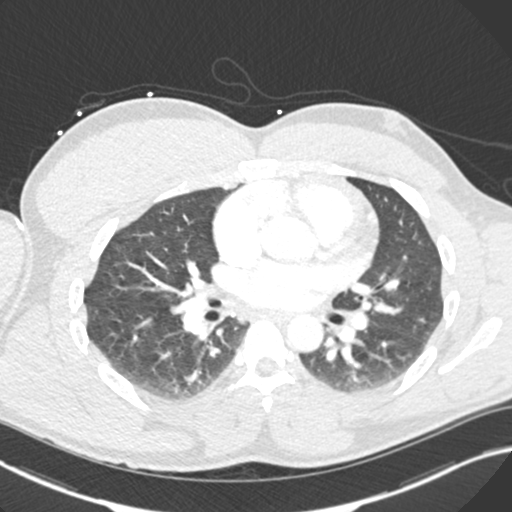
[im 206/353  soft-tissue]
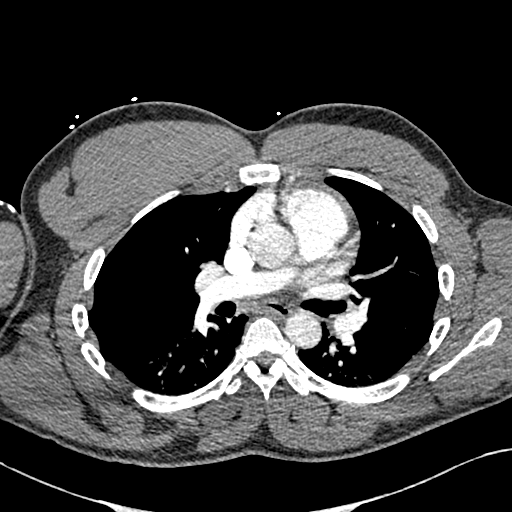
[im 235/353  lung]
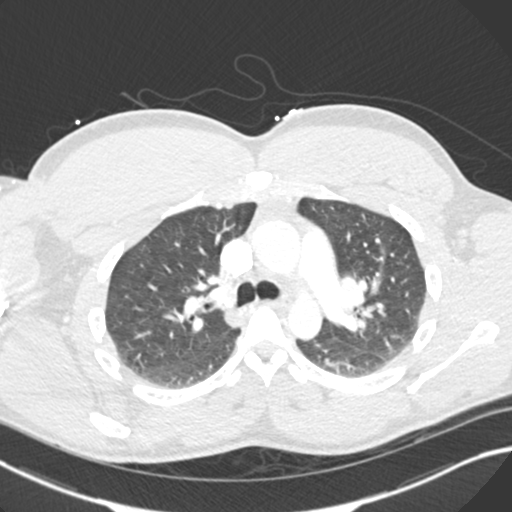
[im 250/353  soft-tissue]
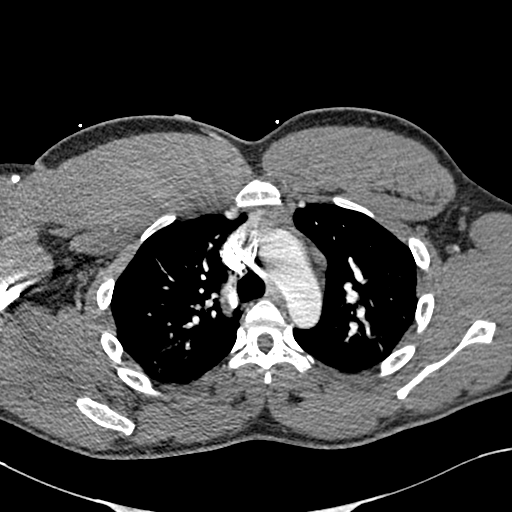
[im 265/353  lung]
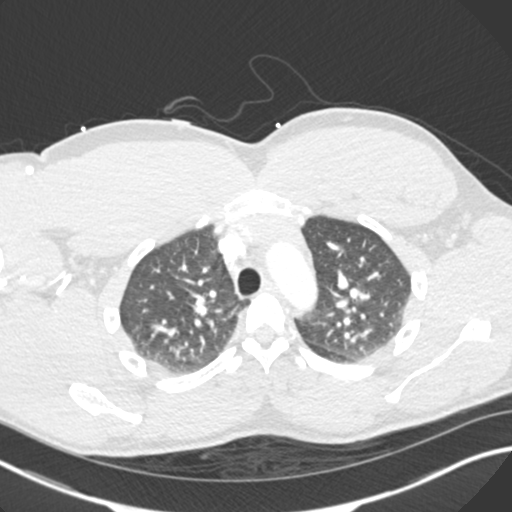
[im 294/353  soft-tissue]
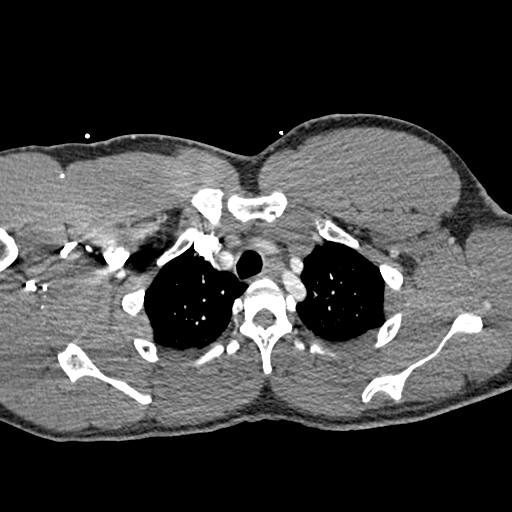
[im 309/353  lung]
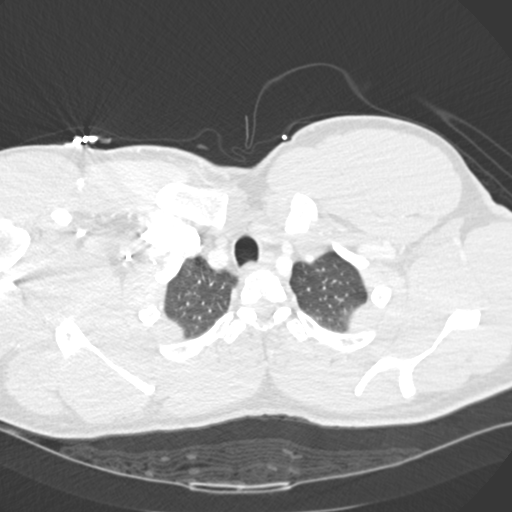
[im 338/353  soft-tissue]
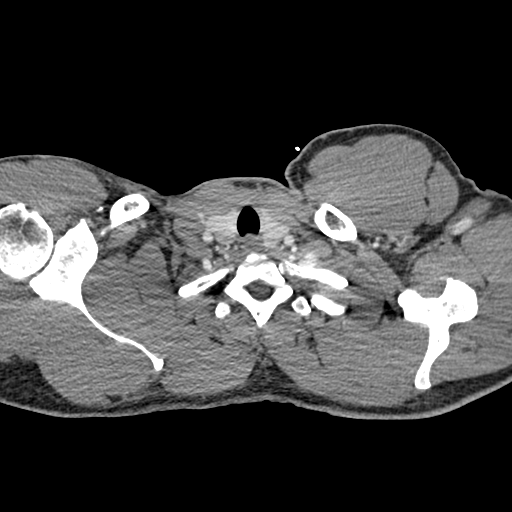

[Series 12: coronal mpr · coronal · 0.55mm/px · 3 of 92 slices shown]
[im 23/92  soft-tissue]
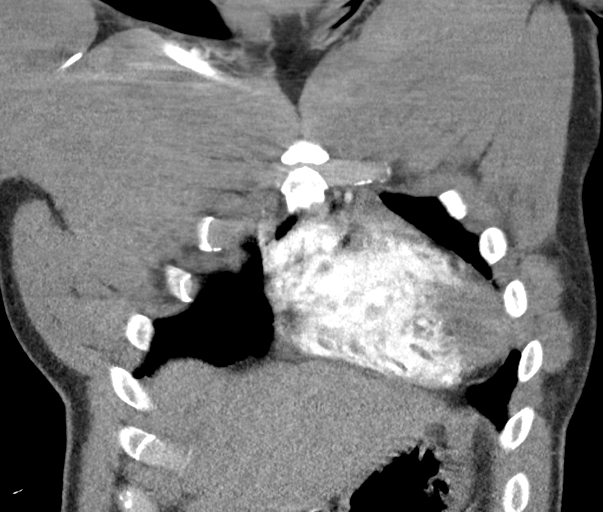
[im 46/92  soft-tissue]
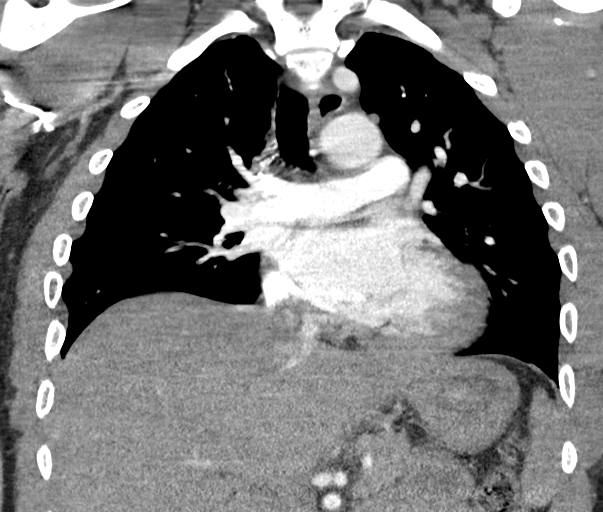
[im 69/92  soft-tissue]
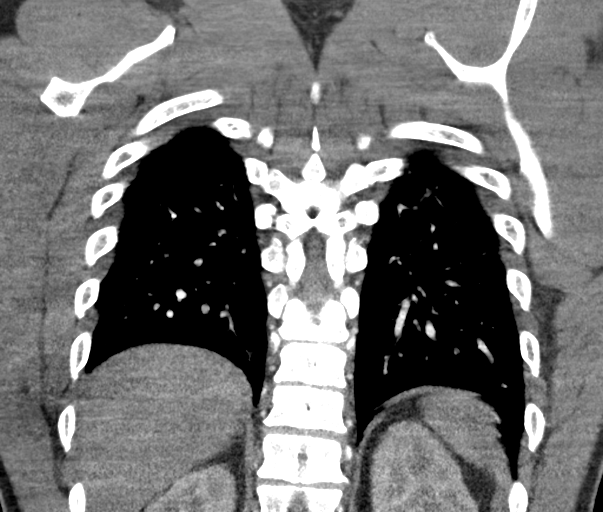

[19 of 46 positions shown; findings below may reference images not displayed]

FINDINGS: Cardiovascular: Satisfactory opacification of the pulmonary arteries
to the segmental level. No evidence of pulmonary embolism. Normal
heart size. No pericardial effusion.

Mediastinum/Nodes: No enlarged mediastinal, hilar, or axillary lymph
nodes. Thyroid gland, trachea, and esophagus demonstrate no
significant findings. Residual thymic tissue within the anterior
mediastinum.

Lungs/Pleura: Lungs are clear. No pleural effusion or pneumothorax.

Upper Abdomen: No acute abnormality.

Musculoskeletal: No acute bone abnormality. Mild degenerative
changes are noted within the midthoracic spine with a limbus
vertebra noted at T11, a benign finding.

Review of the MIP images confirms the above findings.
IMPRESSION: No pulmonary embolism. No acute intrathoracic pathology identified.
No definite radiographic explanation for the patient's symptoms.
